# Patient Record
Sex: Female | Born: 1970 | Race: White | Hispanic: No | Marital: Married | Smoking: Never smoker
Health system: Southern US, Community
[De-identification: ages and names within clinical notes are randomized; demographics above are authoritative.]

## PROBLEM LIST (undated history)

## (undated) DIAGNOSIS — D649 Anemia, unspecified: Secondary | ICD-10-CM

## (undated) DIAGNOSIS — K219 Gastro-esophageal reflux disease without esophagitis: Secondary | ICD-10-CM

## (undated) DIAGNOSIS — Z800 Family history of malignant neoplasm of digestive organs: Secondary | ICD-10-CM

## (undated) DIAGNOSIS — Z8051 Family history of malignant neoplasm of kidney: Secondary | ICD-10-CM

## (undated) DIAGNOSIS — M549 Dorsalgia, unspecified: Secondary | ICD-10-CM

## (undated) DIAGNOSIS — G4730 Sleep apnea, unspecified: Secondary | ICD-10-CM

## (undated) DIAGNOSIS — J45909 Unspecified asthma, uncomplicated: Secondary | ICD-10-CM

## (undated) DIAGNOSIS — Z8049 Family history of malignant neoplasm of other genital organs: Secondary | ICD-10-CM

## (undated) DIAGNOSIS — E538 Deficiency of other specified B group vitamins: Secondary | ICD-10-CM

## (undated) DIAGNOSIS — M1990 Unspecified osteoarthritis, unspecified site: Secondary | ICD-10-CM

## (undated) DIAGNOSIS — R609 Edema, unspecified: Secondary | ICD-10-CM

## (undated) DIAGNOSIS — T7840XA Allergy, unspecified, initial encounter: Secondary | ICD-10-CM

## (undated) DIAGNOSIS — Z96653 Presence of artificial knee joint, bilateral: Secondary | ICD-10-CM

## (undated) DIAGNOSIS — M2550 Pain in unspecified joint: Secondary | ICD-10-CM

## (undated) DIAGNOSIS — R0600 Dyspnea, unspecified: Secondary | ICD-10-CM

## (undated) HISTORY — DX: Anemia, unspecified: D64.9

## (undated) HISTORY — DX: Allergy, unspecified, initial encounter: T78.40XA

## (undated) HISTORY — DX: Sleep apnea, unspecified: G47.30

## (undated) HISTORY — DX: Dorsalgia, unspecified: M54.9

## (undated) HISTORY — PX: JOINT REPLACEMENT: SHX530

## (undated) HISTORY — DX: Unspecified asthma, uncomplicated: J45.909

## (undated) HISTORY — DX: Gastro-esophageal reflux disease without esophagitis: K21.9

## (undated) HISTORY — DX: Pain in unspecified joint: M25.50

## (undated) HISTORY — DX: Family history of malignant neoplasm of digestive organs: Z80.0

## (undated) HISTORY — DX: Dyspnea, unspecified: R06.00

## (undated) HISTORY — DX: Family history of malignant neoplasm of other genital organs: Z80.49

## (undated) HISTORY — DX: Family history of malignant neoplasm of kidney: Z80.51

## (undated) HISTORY — DX: Presence of artificial knee joint, bilateral: Z96.653

## (undated) HISTORY — DX: Unspecified osteoarthritis, unspecified site: M19.90

## (undated) HISTORY — DX: Deficiency of other specified B group vitamins: E53.8

## (undated) HISTORY — DX: Edema, unspecified: R60.9

---

## 1996-09-15 HISTORY — PX: CERVIX LESION DESTRUCTION: SHX591

## 1997-09-15 HISTORY — PX: REDUCTION MAMMAPLASTY: SUR839

## 1997-09-15 HISTORY — PX: BREAST REDUCTION SURGERY: SHX8

## 2013-04-29 ENCOUNTER — Ambulatory Visit: Payer: Self-pay | Admitting: Family Medicine

## 2015-01-26 ENCOUNTER — Other Ambulatory Visit: Payer: Self-pay | Admitting: Unknown Physician Specialty

## 2015-01-26 DIAGNOSIS — M170 Bilateral primary osteoarthritis of knee: Secondary | ICD-10-CM

## 2015-02-02 ENCOUNTER — Ambulatory Visit
Admission: RE | Admit: 2015-02-02 | Discharge: 2015-02-02 | Disposition: A | Discharge status: Home / Self Care | Payer: 59 | Source: Ambulatory Visit | Attending: Unknown Physician Specialty | Admitting: Unknown Physician Specialty

## 2015-02-02 DIAGNOSIS — M170 Bilateral primary osteoarthritis of knee: Secondary | ICD-10-CM

## 2015-02-02 DIAGNOSIS — M25461 Effusion, right knee: Secondary | ICD-10-CM | POA: Diagnosis not present

## 2015-02-02 DIAGNOSIS — M25462 Effusion, left knee: Secondary | ICD-10-CM | POA: Insufficient documentation

## 2015-03-08 DIAGNOSIS — M170 Bilateral primary osteoarthritis of knee: Secondary | ICD-10-CM | POA: Insufficient documentation

## 2015-04-29 ENCOUNTER — Encounter: Payer: Self-pay | Admitting: Internal Medicine

## 2015-04-29 ENCOUNTER — Encounter: Payer: Self-pay | Admitting: Emergency Medicine

## 2015-04-29 ENCOUNTER — Ambulatory Visit
Admission: EM | Admit: 2015-04-29 | Discharge: 2015-04-29 | Disposition: A | Discharge status: Home / Self Care | Payer: 59 | Attending: Emergency Medicine | Admitting: Emergency Medicine

## 2015-04-29 DIAGNOSIS — J029 Acute pharyngitis, unspecified: Secondary | ICD-10-CM | POA: Insufficient documentation

## 2015-04-29 DIAGNOSIS — H6593 Unspecified nonsuppurative otitis media, bilateral: Secondary | ICD-10-CM | POA: Insufficient documentation

## 2015-04-29 DIAGNOSIS — K219 Gastro-esophageal reflux disease without esophagitis: Secondary | ICD-10-CM | POA: Insufficient documentation

## 2015-04-29 DIAGNOSIS — J45990 Exercise induced bronchospasm: Secondary | ICD-10-CM | POA: Insufficient documentation

## 2015-04-29 DIAGNOSIS — Z8742 Personal history of other diseases of the female genital tract: Secondary | ICD-10-CM | POA: Insufficient documentation

## 2015-04-29 LAB — RAPID STREP SCREEN (MED CTR MEBANE ONLY): Streptococcus, Group A Screen (Direct): NEGATIVE

## 2015-04-29 MED ORDER — PREDNISONE 20 MG PO TABS: 40.0000 mg | ORAL_TABLET | Freq: Every day | ORAL | Status: DC

## 2015-04-29 MED ORDER — PREDNISONE 20 MG PO TABS
40.0000 mg | ORAL_TABLET | Freq: Every day | ORAL | Status: AC
  Administered 2015-04-29: 40 mg via ORAL

## 2015-04-29 MED ORDER — IBUPROFEN 800 MG PO TABS
800.0000 mg | ORAL_TABLET | Freq: Three times a day (TID) | ORAL | Status: DC
Start: 2015-04-29 — End: 2018-02-24

## 2015-04-29 MED ORDER — PENICILLIN V POTASSIUM 500 MG PO TABS: 500.0000 mg | ORAL_TABLET | Freq: Two times a day (BID) | ORAL | Status: DC

## 2015-04-29 NOTE — Discharge Instructions (Signed)
Otitis Media With Effusion Otitis media with effusion is the presence of fluid in the middle ear. This is a common problem in children, which often follows ear infections. It may be present for weeks or longer after the infection. Unlike an acute ear infection, otitis media with effusion refers only to fluid behind the ear drum and not infection. Children with repeated ear and sinus infections and allergy problems are the most likely to get otitis media with effusion. CAUSES  The most frequent cause of the fluid buildup is dysfunction of the eustachian tubes. These are the tubes that drain fluid in the ears to the back of the nose (nasopharynx). SYMPTOMS   The main symptom of this condition is hearing loss. As a result, you or your child may:  Listen to the TV at a loud volume.  Not respond to questions.  Ask "what" often when spoken to.  Mistake or confuse one sound or word for another.  There may be a sensation of fullness or pressure but usually not pain. DIAGNOSIS   Your health care provider will diagnose this condition by examining you or your child's ears.  Your health care provider may test the pressure in you or your child's ear with a tympanometer.  A hearing test may be conducted if the problem persists. TREATMENT   Treatment depends on the duration and the effects of the effusion.  Antibiotics, decongestants, nose drops, and cortisone-type drugs (tablets or nasal spray) may not be helpful.  Children with persistent ear effusions may have delayed language or behavioral problems. Children at risk for developmental delays in hearing, learning, and speech may require referral to a specialist earlier than children not at risk.  You or your child's health care provider may suggest a referral to an ear, nose, and throat surgeon for treatment. The following may help restore normal hearing:  Drainage of fluid.  Placement of ear tubes (tympanostomy tubes).  Removal of adenoids  (adenoidectomy). HOME CARE INSTRUCTIONS   Avoid secondhand smoke.  Infants who are breastfed are less likely to have this condition.  Avoid feeding infants while they are lying flat.  Avoid known environmental allergens.  Avoid people who are sick. SEEK MEDICAL CARE IF:   Hearing is not better in 3 months.  Hearing is worse.  Ear pain.  Drainage from the ear.  Dizziness. MAKE SURE YOU:   Understand these instructions.  Will watch your condition.  Will get help right away if you are not doing well or get worse. Document Released: 10/09/2004 Document Revised: 01/16/2014 Document Reviewed: 03/29/2013 [facility] Patient Information 2015 [place], [place]. This information is not intended to replace advice given to you by your health care provider. Make sure you discuss any questions you have with your health care provider. Tonsillitis Tonsillitis is an infection of the throat that causes the tonsils to become red, tender, and swollen. Tonsils are collections of lymphoid tissue at the back of the throat. Each tonsil has crevices (crypts). Tonsils help fight nose and throat infections and keep infection from spreading to other parts of the body for the first 18 months of life.  CAUSES Sudden (acute) tonsillitis is usually caused by infection with streptococcal bacteria. Long-lasting (chronic) tonsillitis occurs when the crypts of the tonsils become filled with pieces of food and bacteria, which makes it easy for the tonsils to become repeatedly infected. SYMPTOMS  Symptoms of tonsillitis include:  A sore throat, with possible difficulty swallowing.  White patches on the tonsils.  Fever.  Tiredness.  New episodes of snoring during sleep, when you did not snore before.  Small, foul-smelling, yellowish-white pieces of material (tonsilloliths) that you occasionally cough up or spit out. The tonsilloliths can also cause you to have bad breath. DIAGNOSIS Tonsillitis can be  diagnosed through a physical exam. Diagnosis can be confirmed with the results of lab tests, including a throat culture. TREATMENT  The goals of tonsillitis treatment include the reduction of the severity and duration of symptoms and prevention of associated conditions. Symptoms of tonsillitis can be improved with the use of steroids to reduce the swelling. Tonsillitis caused by bacteria can be treated with antibiotic medicines. Usually, treatment with antibiotic medicines is started before the cause of the tonsillitis is known. However, if it is determined that the cause is not bacterial, antibiotic medicines will not treat the tonsillitis. If attacks of tonsillitis are severe and frequent, your health care provider may recommend surgery to remove the tonsils (tonsillectomy). HOME CARE INSTRUCTIONS   Rest as much as possible and get plenty of sleep.  Drink plenty of fluids. While the throat is very sore, eat soft foods or liquids, such as sherbet, soups, or instant breakfast drinks.  Eat frozen ice pops.  Gargle with a warm or cold liquid to help soothe the throat. Mix 1/4 teaspoon of salt and 1/4 teaspoon of baking soda in 8 oz of water. SEEK MEDICAL CARE IF:   Large, tender lumps develop in your neck.  A rash develops.  A green, yellow-brown, or bloody substance is coughed up.  You are unable to swallow liquids or food for 24 hours.  You notice that only one of the tonsils is swollen. SEEK IMMEDIATE MEDICAL CARE IF:   You develop any new symptoms such as vomiting, severe headache, stiff neck, chest pain, or trouble breathing or swallowing.  You have severe throat pain along with drooling or voice changes.  You have severe pain, unrelieved with recommended medications.  You are unable to fully open the mouth.  You develop redness, swelling, or severe pain anywhere in the neck.  You have a fever. MAKE SURE YOU:   Understand these instructions.  Will watch your  condition.  Will get help right away if you are not doing well or get worse. Document Released: 06/11/2005 Document Revised: 01/16/2014 Document Reviewed: 02/18/2013 [facility] Patient Information 2015 [place], [place]. This information is not intended to replace advice given to you by your health care provider. Make sure you discuss any questions you have with your health care provider.

## 2015-04-29 NOTE — ED Provider Notes (Signed)
CSN: [id number]     Arrival date & time 04/29/15  1408 History   First MD Initiated Contact with Patient 04/29/15 1630     Chief Complaint  Patient presents with  . 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 Today feels like solid food catching in her throat.  Fluids ok.   Seasonal allergic rhinitis typically spring.  No known sick contacts.  Takes motrin po prn at home without side effects if needed.  PShx:  Breast reduction 1999  PMHX GERD on prilosec  Father cancer M- parkinsons, HTN  PCM L Burglund  Patient is a 44 y.o. female presenting with pharyngitis. The history is provided by the patient.  Sore Throat This is a new problem. The current episode started more than 2 days ago. The problem occurs constantly. The problem has been gradually worsening. Pertinent negatives include no chest pain, no abdominal pain, no headaches and no shortness of breath. The symptoms are aggravated by eating, swallowing and drinking. Nothing relieves the symptoms. She has tried food, water and rest for the symptoms. The treatment provided no relief.    No past medical history on file. Past Surgical History  Procedure Laterality Date  . Breast reduction surgery  1999  . Cervix lesion destruction  1998    abnormal PAP   Family History  Problem Relation Age of Onset  . Hypertension Mother   . Hypertension Father   . Renal cancer Father    Social History  Substance Use Topics  . Smoking status: Never Smoker   . Smokeless tobacco: None  . Alcohol Use: 0.6 oz/week    1 Glasses of wine per week   OB History    No data available     Review of Systems  Constitutional: Negative for fever, chills, diaphoresis, activity change, appetite change and fatigue.   HENT: Positive for sore throat and trouble swallowing. Negative for congestion, dental problem, drooling, ear discharge, ear pain, facial swelling, hearing loss, mouth sores, nosebleeds, rhinorrhea, sneezing, tinnitus and voice change.   Eyes: Negative for photophobia, pain, discharge, redness, itching and visual disturbance.  Respiratory: Positive for cough. Negative for choking, chest tightness, shortness of breath, wheezing and stridor.   Cardiovascular: Negative for chest pain, palpitations and leg swelling.  Gastrointestinal: Positive for nausea. Negative for vomiting, abdominal pain, diarrhea, constipation, blood in stool and abdominal distention.  Endocrine: Negative for cold intolerance and heat intolerance.  Genitourinary: Negative for dysuria and hematuria.  Musculoskeletal: Negative for myalgias, back pain, joint swelling, arthralgias, gait problem, neck pain and neck stiffness.  Skin: Negative for color change, pallor, rash and wound.  Allergic/Immunologic: Positive for environmental allergies. Negative for food allergies.  Neurological: Negative for dizziness, tremors, seizures, syncope, facial asymmetry, speech difficulty, weakness, light-headedness, numbness and headaches.  Hematological: Negative for adenopathy. Does not bruise/bleed easily.  Psychiatric/Behavioral: Negative for behavioral problems, confusion, sleep disturbance and agitation.    Allergies  Review of patient's allergies indicates no known allergies.  Home Medications   Prior to Admission medications   Medication Sig Start Date End Date Taking? Authorizing Provider  omeprazole (PRILOSEC) 20 MG capsule Take 20 mg by mouth daily.   Yes Historical Provider, MD  ibuprofen (ADVIL,MOTRIN) 800 MG tablet Take 1 tablet (800 mg total) by mouth 3 (three) times daily. 04/29/15   Olen Cordial, NP  penicillin v potassium (VEETID) 500 MG tablet Take 1 tablet (500 mg total) by mouth 2 (two) times daily. 04/29/15   Olen Cordial, NP  predniSONE (DELTASONE) 20 MG tablet Take 2 tablets (40 mg total) by mouth daily with breakfast. 04/29/15   Aura Fey Betancourt, NP   BP 131/65 mmHg  Pulse 69  Temp(Src) 98.4 F (36.9 C) (Oral)  Resp 18  Ht 5\' 3"  (1.6 m)  Wt 270 lb (122.471 kg)  BMI 47.84 kg/m2  SpO2 99%  LMP 04/09/2015 Physical Exam  Constitutional: She is oriented to person, place, and time. Vital signs are normal. She appears well-developed and well-nourished. No distress.  HENT:  Head: Normocephalic and atraumatic.  Right Ear: Hearing, external ear and ear canal normal. A middle ear effusion is present.  Left Ear: Hearing, external ear and ear canal normal. A middle ear effusion is present.  Nose: Nose normal. No mucosal edema or rhinorrhea. Right sinus exhibits no maxillary sinus tenderness and no frontal sinus tenderness. Left sinus exhibits no maxillary sinus tenderness and no frontal sinus tenderness.  Mouth/Throat: Uvula is midline and mucous membranes are normal. Mucous membranes are not pale, not dry and not cyanotic. She does not have dentures. No oral lesions. No trismus in the jaw. Normal dentition. No dental abscesses, uvula swelling, lacerations or dental caries. Posterior oropharyngeal edema and posterior oropharyngeal erythema present. No oropharyngeal exudate or tonsillar abscesses.  Cobblestoning posterior pharynx; tonsils erythema edema 0-1+/4; bilateral TMs with air fluid level clear  Eyes: Conjunctivae, EOM and lids are normal. Pupils are equal, round, and reactive to light. Right eye exhibits no discharge. Left eye exhibits no discharge. No scleral icterus.  Neck: Trachea normal and normal range of motion. Neck supple. No tracheal deviation present.  Cardiovascular: Normal rate, regular rhythm, normal heart sounds and intact distal pulses.  Exam reveals no gallop and no friction rub.   No murmur heard. Pulmonary/Chest: Breath sounds normal. No stridor. No respiratory distress. She has no  wheezes. She has no rales. She exhibits no tenderness.  Abdominal: Soft. Bowel sounds are normal. She exhibits no shifting dullness, no distension, no pulsatile liver, no fluid wave, no abdominal bruit, no ascites, no pulsatile midline mass and no mass. There is no tenderness. There is no rigidity, no rebound and no guarding. Hernia confirmed negative in the ventral area.  Dull to percussion x 4quads  Musculoskeletal: Normal range of motion. She exhibits no edema or tenderness.  Lymphadenopathy:    She has no cervical adenopathy.  Neurological: She is alert and oriented to person, place, and time. She exhibits normal muscle tone. Coordination normal.  Skin: Skin is warm, dry and intact. No rash noted. She is not diaphoretic. No erythema. No pallor.  Psychiatric: She has a normal mood and affect. Her speech is normal and behavior is normal. Judgment and thought content normal. Cognition and memory are normal.  Nursing note and vitals reviewed.   ED Course  Procedures (including critical care time) Labs Review Labs Reviewed  RAPID STREP SCREEN (NOT AT [facility])  CULTURE, GROUP A STREP ([facility] ONLY)    Imaging Review No results found.  Medications  predniSONE (DELTASONE) tablet 40 mg (40 mg Oral Given 04/29/15 1659)  administered by CMA Josie Saunders MDM   1. Acute pharyngitis, unspecified pharyngitis type   2. Otitis media with effusion, bilateral    Patient notified rapid strep negative.  Will call with throat culture results once available typically 48 hours.  Currently in community  atypical strep common.  Patient given Rx for penicillin V 500mg  po BID to start if exudate noted on tonsils and/or if throat culture positive.  Start prednisone 40mg  po daily Rx tomorrow pharmacy currently closed patient given first dose in clinic.  Continue motrin 800mg  po TID prn pain.  Offered Dukes' Mouthwash patient refused.  Usually no specific medical treatment is needed if a virus is causing the sore throat.   The throat most often gets better on its own within 5 to 7 days.  Antibiotic medicine does not cure viral pharyngitis.   For acute pharyngitis caused by bacteria, your healthcare provider will prescribe an antibiotic.  Marland Kitchen Do not smoke.  Marland Kitchen Avoid secondhand smoke and other air pollutants.  . Use a cool mist humidifier to add moisture to the air.  . Get plenty of rest.  . You may want to rest your throat by talking less and eating a diet that is mostly liquid or soft for a day or two.   Marland Kitchen Nonprescription throat lozenges and mouthwashes should help relieve the soreness.   . Gargling with warm saltwater and drinking warm liquids may help.  (You can make a saltwater solution by adding 1/4 teaspoon of salt to 8 ounces, or 240 mL, of warm water.)  . A nonprescription pain reliever such as aspirin, acetaminophen, or ibuprofen may ease general aches and pains.   FOLLOW UP with clinic provider if no improvements in the next 7-10 days.  exitcare handout on pharyngitis given to patient.  Patient verbalized understanding of instructions and agreed with plan of care no further questions at this time. P2:  Hand washing and diet.  Supportive treatment.   No evidence of invasive bacterial infection, non toxic and well hydrated.  This is most likely self limiting viral infection.  I do not see where any further testing or imaging is necessary at this time.   I will suggest supportive care, rest, good hygiene and encourage the patient to take adequate fluids.  The patient is to return to clinic or EMERGENCY ROOM if symptoms worsen or change significantly e.g. ear pain, fever, purulent discharge from ears or bleeding.  Exitcare handout on otitis media with effusion given to patient.  Patient verbalized agreement and understanding of treatment plan.     Olen Cordial, NP 04/29/15 1909  Telephone message left for patient throat culture positive strep alginate and to start penicillin V 500mg  po BID Rx that I gave her  at appt if she has not already.  Patient to contact clinic if further questions or concerns.  Olen Cordial, NP 05/02/15 1021  04 May 2015 at 1506 patient contacted via telephone and she verbalized she received my message from 17 Aug started penicillin and symptoms have all resolved.  Patient notified throat culture positive for strep.  Patient to finish all medication.  Patient verbalized understanding of information/instructions, agreed with plan of care and had no further questions at this time.  Olen Cordial, NP 05/04/15 1507

## 2015-04-29 NOTE — ED Notes (Signed)
Pt feels like a knot in right side of throat x x3 days

## 2015-05-02 LAB — CULTURE, GROUP A STREP (THRC)

## 2015-06-01 ENCOUNTER — Ambulatory Visit (INDEPENDENT_AMBULATORY_CARE_PROVIDER_SITE_OTHER): Payer: 59 | Admitting: Internal Medicine

## 2015-06-01 ENCOUNTER — Encounter: Payer: Self-pay | Admitting: Internal Medicine

## 2015-06-01 VITALS — BP 132/76 | HR 72 | Ht 63.0 in | Wt 277.6 lb

## 2015-06-01 DIAGNOSIS — H6500 Acute serous otitis media, unspecified ear: Secondary | ICD-10-CM | POA: Diagnosis not present

## 2015-06-01 MED ORDER — AMOXICILLIN-POT CLAVULANATE 875-125 MG PO TABS: 1.0000 | ORAL_TABLET | Freq: Two times a day (BID) | ORAL | Status: DC

## 2015-06-01 MED ORDER — FLUTICASONE PROPIONATE 50 MCG/ACT NA SUSP: 2.0000 | Freq: Every day | NASAL | Status: DC

## 2015-06-01 NOTE — Progress Notes (Signed)
Date:  06/01/2015   Name:  Kaitlyn Mcdaniel   DOB:  10/18/1970   MRN:  [id number]   Chief Complaint: Dizziness Dizziness Associated symptoms include coughing and fatigue. Pertinent negatives include no chest pain, chills, diaphoresis, fever, headaches or sore throat.   Patient was seen in urgent care a month ago with strep. Strep screen was negative for culture was positive for beta strep. Monospot was not done she received 10 days of amoxicillin and felt that she had recovered. However week ago she began to feel dizzy with fatigue and slight cough and excessive sleepiness. She was slept extra hours per night but still feels no better. She denies ringing in the ears ear drainage recurrent sore throat and fever  Review of Systems:  Review of Systems  Constitutional: Positive for fatigue. Negative for fever, chills and diaphoresis.  HENT: Negative for sore throat, tinnitus and trouble swallowing.   Respiratory: Positive for cough. Negative for shortness of breath.   Cardiovascular: Negative for chest pain.  Neurological: Positive for dizziness and light-headedness. Negative for headaches.    Patient Active Problem List   Diagnosis Date Noted  . Exercise-induced asthma 04/29/2015  . GERD without esophagitis 04/29/2015  . Hx of abnormal cervical Papanicolaou smear 04/29/2015  . Degenerative arthritis of knee, bilateral 03/08/2015    Prior to Admission medications   Medication Sig Start Date End Date Taking? Authorizing Provider  albuterol (PROVENTIL HFA;VENTOLIN HFA) 108 (90 BASE) MCG/ACT inhaler Inhale into the lungs every 6 (six) hours as needed for wheezing or shortness of breath.   Yes Historical Provider, MD  celecoxib (CELEBREX) 200 MG capsule Take 200 mg by mouth 2 (two) times daily.   Yes Historical Provider, MD  ibuprofen (ADVIL,MOTRIN) 800 MG tablet Take 1 tablet (800 mg total) by mouth 3 (three) times daily. 04/29/15  Yes Olen Cordial, NP  omeprazole (PRILOSEC) 20 MG  capsule Take 20 mg by mouth daily.   Yes Historical Provider, MD    No Known Allergies  Past Surgical History  Procedure Laterality Date  . Breast reduction surgery  1999  . Cervix lesion destruction  1998    abnormal PAP    Social History  Substance Use Topics  . Smoking status: Never Smoker   . Smokeless tobacco: None  . Alcohol Use: 0.6 oz/week    1 Glasses of wine per week     Medication list has been reviewed and updated.  Physical Examination:  Physical Exam  Constitutional: She appears well-developed and well-nourished.  HENT:  Right Ear: Ear canal normal. A middle ear effusion is present.  Left Ear: Ear canal normal. A middle ear effusion is present.  Nose: Nose normal.  Mouth/Throat: Uvula is midline and oropharynx is clear and moist.  Eyes: Conjunctivae are normal. Pupils are equal, round, and reactive to light. Right eye exhibits nystagmus. Left eye exhibits nystagmus.  Cardiovascular: Normal rate, regular rhythm and normal heart sounds.   Pulmonary/Chest: Breath sounds normal. She has no wheezes.  Psychiatric: She has a normal mood and affect.    BP 132/76 mmHg  Pulse 72  Ht 5\' 3"  (1.6 m)  Wt 277 lb 9.6 oz (125.919 kg)  BMI 49.19 kg/m2  Assessment and Plan: 1. Acute serous otitis media, recurrence not specified, unspecified laterality - fluticasone (FLONASE) 50 MCG/ACT nasal spray; Place 2 sprays into both nostrils daily.  Dispense: 16 g; Refill: 6 - amoxicillin-clavulanate (AUGMENTIN) 875-125 MG per tablet; Take 1 tablet by mouth 2 (two) times daily.  Dispense: 20 tablet; Refill: 0   Halina Maidens, MD [facility]  06/01/2015

## 2015-06-08 ENCOUNTER — Encounter: Payer: Self-pay | Admitting: Internal Medicine

## 2015-06-08 ENCOUNTER — Ambulatory Visit (INDEPENDENT_AMBULATORY_CARE_PROVIDER_SITE_OTHER): Payer: 59 | Admitting: Internal Medicine

## 2015-06-08 VITALS — BP 140/84 | HR 76 | Ht 63.0 in | Wt 275.0 lb

## 2015-06-08 DIAGNOSIS — J45990 Exercise induced bronchospasm: Secondary | ICD-10-CM | POA: Diagnosis not present

## 2015-06-08 DIAGNOSIS — K219 Gastro-esophageal reflux disease without esophagitis: Secondary | ICD-10-CM

## 2015-06-08 DIAGNOSIS — R5383 Other fatigue: Secondary | ICD-10-CM | POA: Diagnosis not present

## 2015-06-08 MED ORDER — ALBUTEROL SULFATE HFA 108 (90 BASE) MCG/ACT IN AERS
2.0000 | INHALATION_SPRAY | Freq: Four times a day (QID) | RESPIRATORY_TRACT | Status: DC | PRN
Start: 2015-06-08 — End: 2016-10-21

## 2015-06-08 NOTE — Progress Notes (Signed)
Date:  06/08/2015   Name:  Kaitlyn Mcdaniel   DOB:  August 15, 1971   MRN:  [id number]   Chief Complaint: Dizziness and Fatigue  Kaitlyn Mcdaniel was seen last week with serous otitis media. She was given another round of antibiotics and placed on Flonase. She says the ears feel better but she is still dizzy at times. And now she seems overwhelmed by fatigue. On questioning,  the fatigue was present before the infection but now seems worse. She sleeps 10 or 11 hours and doesn't awake feeling rested. She admits to snoring but is not aware of any apnea.  Review of Systems:  Review of Systems  Constitutional: Positive for fatigue. Negative for fever and chills.  HENT: Negative for hearing loss.   Respiratory: Negative for cough, chest tightness and shortness of breath.   Cardiovascular: Negative for chest pain and palpitations.  Musculoskeletal: Negative for myalgias and joint swelling.  Neurological: Positive for dizziness. Negative for syncope and numbness.  Hematological: Negative for adenopathy.    Patient Active Problem List   Diagnosis Date Noted  . Exercise-induced asthma 04/29/2015  . GERD without esophagitis 04/29/2015  . Hx of abnormal cervical Papanicolaou smear 04/29/2015  . Degenerative arthritis of knee, bilateral 03/08/2015    Prior to Admission medications   Medication Sig Start Date End Date Taking? Authorizing Provider  albuterol (PROVENTIL HFA;VENTOLIN HFA) 108 (90 BASE) MCG/ACT inhaler Inhale into the lungs every 6 (six) hours as needed for wheezing or shortness of breath.   Yes Historical Provider, MD  amoxicillin-clavulanate (AUGMENTIN) 875-125 MG per tablet Take 1 tablet by mouth 2 (two) times daily. 06/01/15  Yes Glean Hess, MD  celecoxib (CELEBREX) 200 MG capsule Take 200 mg by mouth 2 (two) times daily.   Yes Historical Provider, MD  fluticasone (FLONASE) 50 MCG/ACT nasal spray Place 2 sprays into both nostrils daily. 06/01/15  Yes Glean Hess, MD  ibuprofen  (ADVIL,MOTRIN) 800 MG tablet Take 1 tablet (800 mg total) by mouth 3 (three) times daily. 04/29/15  Yes Olen Cordial, NP  omeprazole (PRILOSEC) 20 MG capsule Take 20 mg by mouth daily.   Yes Historical Provider, MD    No Known Allergies  Past Surgical History  Procedure Laterality Date  . Breast reduction surgery  1999  . Cervix lesion destruction  1998    abnormal PAP    Social History  Substance Use Topics  . Smoking status: Never Smoker   . Smokeless tobacco: None  . Alcohol Use: 0.6 oz/week    1 Glasses of wine per week     Medication list has been reviewed and updated.  Physical Examination:  Physical Exam  Constitutional: She appears well-developed and well-nourished.  HENT:  Right Ear: Ear canal normal. A middle ear effusion is present.  Left Ear: Ear canal normal. A middle ear effusion is present.  Eyes: Pupils are equal, round, and reactive to light. Right eye exhibits nystagmus. Left eye exhibits nystagmus.  Neck: Normal range of motion. Neck supple.  Cardiovascular: Normal rate and regular rhythm.   Pulmonary/Chest: Effort normal and breath sounds normal.  Lymphadenopathy:    She has no cervical adenopathy.  Psychiatric: She has a normal mood and affect.    BP 140/84 mmHg  Pulse 76  Ht 5\' 3"  (1.6 m)  Wt 275 lb (124.739 kg)  BMI 48.73 kg/m2  Assessment and Plan: 1. Exercise-induced asthma - albuterol (PROVENTIL HFA;VENTOLIN HFA) 108 (90 BASE) MCG/ACT inhaler; Inhale 2 puffs into the lungs every  6 (six) hours as needed for wheezing or shortness of breath.  Dispense: 18 g; Refill: 3  2. Other fatigue We'll rule out metabolic cause; Consider sleep study Recommend that she see ENT for persistent middle ear ear effusion and dizziness - Comprehensive metabolic panel - TSH - CBC with Differential/Platelet  3. GERD without esophagitis - CBC with Differential/Platelet   Halina Maidens, MD [facility]  06/08/2015

## 2015-06-09 ENCOUNTER — Other Ambulatory Visit: Payer: Self-pay | Admitting: Internal Medicine

## 2015-06-09 DIAGNOSIS — R0683 Snoring: Secondary | ICD-10-CM

## 2015-06-09 DIAGNOSIS — R5382 Chronic fatigue, unspecified: Secondary | ICD-10-CM

## 2015-06-09 LAB — CBC WITH DIFFERENTIAL/PLATELET
BASOS: 0 %
Basophils Absolute: 0 10*3/uL (ref 0.0–0.2)
EOS (ABSOLUTE): 0.3 10*3/uL (ref 0.0–0.4)
EOS: 4 %
HEMATOCRIT: 37.2 % (ref 34.0–46.6)
HEMOGLOBIN: 11.8 g/dL (ref 11.1–15.9)
IMMATURE GRANULOCYTES: 0 %
Immature Grans (Abs): 0 10*3/uL (ref 0.0–0.1)
Lymphocytes Absolute: 1.9 10*3/uL (ref 0.7–3.1)
Lymphs: 24 %
MCH: 24.7 pg — ABNORMAL LOW (ref 26.6–33.0)
MCHC: 31.7 g/dL (ref 31.5–35.7)
MCV: 78 fL — AB (ref 79–97)
MONOCYTES: 7 %
MONOS ABS: 0.5 10*3/uL (ref 0.1–0.9)
NEUTROS PCT: 65 %
Neutrophils Absolute: 5.3 10*3/uL (ref 1.4–7.0)
Platelets: 267 10*3/uL (ref 150–379)
RBC: 4.77 x10E6/uL (ref 3.77–5.28)
RDW: 15.5 % — AB (ref 12.3–15.4)
WBC: 8 10*3/uL (ref 3.4–10.8)

## 2015-06-09 LAB — COMPREHENSIVE METABOLIC PANEL
A/G RATIO: 1.4 (ref 1.1–2.5)
ALT: 14 IU/L (ref 0–32)
AST: 12 IU/L (ref 0–40)
Albumin: 3.8 g/dL (ref 3.5–5.5)
Alkaline Phosphatase: 100 IU/L (ref 39–117)
BUN/Creatinine Ratio: 18 (ref 9–23)
BUN: 14 mg/dL (ref 6–24)
Bilirubin Total: 0.3 mg/dL (ref 0.0–1.2)
CALCIUM: 9 mg/dL (ref 8.7–10.2)
CO2: 25 mmol/L (ref 18–29)
CREATININE: 0.76 mg/dL (ref 0.57–1.00)
Chloride: 102 mmol/L (ref 97–108)
GFR, EST AFRICAN AMERICAN: 110 mL/min/{1.73_m2} (ref >59)
GFR, EST NON AFRICAN AMERICAN: 95 mL/min/{1.73_m2} (ref >59)
GLOBULIN, TOTAL: 2.8 g/dL (ref 1.5–4.5)
Glucose: 107 mg/dL — ABNORMAL HIGH (ref 65–99)
POTASSIUM: 4.5 mmol/L (ref 3.5–5.2)
SODIUM: 141 mmol/L (ref 134–144)
TOTAL PROTEIN: 6.6 g/dL (ref 6.0–8.5)

## 2015-06-09 LAB — TSH: TSH: 1.38 u[IU]/mL (ref 0.450–4.500)

## 2015-07-03 ENCOUNTER — Ambulatory Visit: Payer: 59 | Attending: Internal Medicine

## 2015-07-03 DIAGNOSIS — G4733 Obstructive sleep apnea (adult) (pediatric): Secondary | ICD-10-CM | POA: Insufficient documentation

## 2015-08-02 ENCOUNTER — Ambulatory Visit: Payer: 59 | Attending: Internal Medicine

## 2015-08-02 DIAGNOSIS — G4733 Obstructive sleep apnea (adult) (pediatric): Secondary | ICD-10-CM | POA: Insufficient documentation

## 2015-08-06 ENCOUNTER — Encounter: Payer: Self-pay | Admitting: Internal Medicine

## 2015-08-06 DIAGNOSIS — G4733 Obstructive sleep apnea (adult) (pediatric): Secondary | ICD-10-CM | POA: Insufficient documentation

## 2015-09-05 ENCOUNTER — Encounter: Payer: Self-pay | Admitting: Internal Medicine

## 2015-09-05 ENCOUNTER — Ambulatory Visit (INDEPENDENT_AMBULATORY_CARE_PROVIDER_SITE_OTHER): Payer: 59 | Admitting: Internal Medicine

## 2015-09-05 ENCOUNTER — Other Ambulatory Visit: Payer: Self-pay | Admitting: Internal Medicine

## 2015-09-05 VITALS — BP 130/88 | HR 60 | Ht 63.0 in | Wt 278.4 lb

## 2015-09-05 DIAGNOSIS — J45990 Exercise induced bronchospasm: Secondary | ICD-10-CM | POA: Diagnosis not present

## 2015-09-05 DIAGNOSIS — Z0000 Encounter for general adult medical examination without abnormal findings: Secondary | ICD-10-CM

## 2015-09-05 DIAGNOSIS — R7303 Prediabetes: Secondary | ICD-10-CM | POA: Insufficient documentation

## 2015-09-05 DIAGNOSIS — G4733 Obstructive sleep apnea (adult) (pediatric): Secondary | ICD-10-CM | POA: Diagnosis not present

## 2015-09-05 DIAGNOSIS — K219 Gastro-esophageal reflux disease without esophagitis: Secondary | ICD-10-CM | POA: Diagnosis not present

## 2015-09-05 DIAGNOSIS — R7309 Other abnormal glucose: Secondary | ICD-10-CM

## 2015-09-05 DIAGNOSIS — Z124 Encounter for screening for malignant neoplasm of cervix: Secondary | ICD-10-CM | POA: Diagnosis not present

## 2015-09-05 DIAGNOSIS — E119 Type 2 diabetes mellitus without complications: Secondary | ICD-10-CM | POA: Insufficient documentation

## 2015-09-05 DIAGNOSIS — M170 Bilateral primary osteoarthritis of knee: Secondary | ICD-10-CM | POA: Diagnosis not present

## 2015-09-05 DIAGNOSIS — Z1231 Encounter for screening mammogram for malignant neoplasm of breast: Secondary | ICD-10-CM

## 2015-09-05 DIAGNOSIS — R739 Hyperglycemia, unspecified: Secondary | ICD-10-CM

## 2015-09-05 DIAGNOSIS — Z79899 Other long term (current) drug therapy: Secondary | ICD-10-CM | POA: Insufficient documentation

## 2015-09-05 DIAGNOSIS — E118 Type 2 diabetes mellitus with unspecified complications: Secondary | ICD-10-CM | POA: Insufficient documentation

## 2015-09-05 LAB — POCT URINALYSIS DIPSTICK
BILIRUBIN UA: NEGATIVE
GLUCOSE UA: NEGATIVE
Ketones, UA: NEGATIVE
LEUKOCYTES UA: NEGATIVE
Nitrite, UA: NEGATIVE
PH UA: 5
Protein, UA: NEGATIVE
RBC UA: NEGATIVE
Spec Grav, UA: 1.02
Urobilinogen, UA: 0.2

## 2015-09-05 MED ORDER — OMEPRAZOLE 20 MG PO CPDR: 20.0000 mg | DELAYED_RELEASE_CAPSULE | Freq: Every day | ORAL | Status: DC

## 2015-09-05 MED ORDER — TEMAZEPAM 15 MG PO CAPS: 15.0000 mg | ORAL_CAPSULE | Freq: Every evening | ORAL | Status: DC | PRN

## 2015-09-05 NOTE — Progress Notes (Signed)
Date:  09/05/2015   Name:  Kaitlyn Mcdaniel   DOB:  12-Feb-1971   MRN:  [id number]   Chief Complaint: Annual Exam Kaitlyn Mcdaniel is a 44 y.o. female who presents today for her Complete Annual Exam. She feels fairly well. She reports exercising none. She reports she is sleeping poorly due to new CPAP therapy. She is struggling to get to sleep for several hours.  Once she falls asleep she tolerates it well.  Knee Pain  The pain is present in the left knee and right knee. The quality of the pain is described as aching. The pain is moderate. The pain has been intermittent since onset. Improvement on treatment: need knee replacement.  Gastroesophageal Reflux She complains of belching and heartburn. She reports no abdominal pain, no chest pain, no coughing, no nausea, no sore throat or no wheezing. This is a chronic problem. The problem occurs occasionally. The heartburn does not wake her from sleep. The heartburn does not limit her activity. The heartburn doesn't change with position. Pertinent negatives include no fatigue.    Review of Systems  Constitutional: Negative for chills, diaphoresis, fatigue and unexpected weight change (gradually gained 25 lbs over past year or so).  HENT: Negative for hearing loss, sore throat, tinnitus and trouble swallowing.   Eyes: Negative for visual disturbance.  Respiratory: Positive for apnea (started CPAP 2 weeks ag). Negative for cough, chest tightness, shortness of breath and wheezing.   Cardiovascular: Negative for chest pain, palpitations and leg swelling.  Gastrointestinal: Positive for heartburn. Negative for nausea, abdominal pain, diarrhea and blood in stool.  Genitourinary: Negative for dysuria, urgency, frequency, hematuria, vaginal pain and menstrual problem (slightly irregular).  Musculoskeletal: Positive for arthralgias (bilateral knee OA). Negative for myalgias and joint swelling.  Skin: Negative for rash.  Neurological: Negative for  dizziness, tremors and headaches.  Hematological: Negative for adenopathy. Does not bruise/bleed easily.  Psychiatric/Behavioral: Positive for sleep disturbance. Negative for dysphoric mood. The patient is not nervous/anxious.     Patient Active Problem List   Diagnosis Date Noted  . OSA (obstructive sleep apnea) 08/06/2015  . Exercise-induced asthma 04/29/2015  . GERD without esophagitis 04/29/2015  . Hx of abnormal cervical Papanicolaou smear 04/29/2015  . Degenerative arthritis of knee, bilateral 03/08/2015    Prior to Admission medications   Medication Sig Start Date End Date Taking? Authorizing Provider  albuterol (PROVENTIL HFA;VENTOLIN HFA) 108 (90 BASE) MCG/ACT inhaler Inhale 2 puffs into the lungs every 6 (six) hours as needed for wheezing or shortness of breath. 06/08/15  Yes Glean Hess, MD  celecoxib (CELEBREX) 200 MG capsule Take 200 mg by mouth 2 (two) times daily.   Yes Historical Provider, MD  ibuprofen (ADVIL,MOTRIN) 800 MG tablet Take 1 tablet (800 mg total) by mouth 3 (three) times daily. 04/29/15  Yes Olen Cordial, NP  omeprazole (PRILOSEC) 20 MG capsule Take 20 mg by mouth daily.   Yes Historical Provider, MD    No Known Allergies  Past Surgical History  Procedure Laterality Date  . Breast reduction surgery  1999  . Cervix lesion destruction  1998    abnormal PAP    Social History  Substance Use Topics  . Smoking status: Never Smoker   . Smokeless tobacco: None  . Alcohol Use: 0.6 oz/week    1 Glasses of wine per week    Medication list has been reviewed and updated.   Physical Exam  Constitutional: She is oriented to person, place, and  time. She appears well-developed and well-nourished. No distress.  HENT:  Head: Normocephalic and atraumatic.  Right Ear: Tympanic membrane and ear canal normal.  Left Ear: Tympanic membrane and ear canal normal.  Nose: Right sinus exhibits no maxillary sinus tenderness. Left sinus exhibits no maxillary  sinus tenderness.  Mouth/Throat: Uvula is midline and oropharynx is clear and moist.  Eyes: Conjunctivae and EOM are normal.  Neck: Normal range of motion. Carotid bruit is not present. No erythema present. No thyromegaly present.  Cardiovascular: Normal rate, regular rhythm, normal heart sounds and normal pulses.   Pulmonary/Chest: Effort normal. No respiratory distress. She has no wheezes. Right breast exhibits no mass, no nipple discharge, no skin change and no tenderness. Left breast exhibits no mass, no nipple discharge, no skin change and no tenderness.  S/p bilateral breast reduction surgery  Abdominal: Soft. Bowel sounds are normal. There is no hepatosplenomegaly. There is no tenderness. There is no CVA tenderness.  Genitourinary: Rectum normal, vagina normal and uterus normal. There is no tenderness, lesion or injury on the right labia. There is no tenderness, lesion or injury on the left labia. Cervix exhibits discharge and friability. Cervix exhibits no motion tenderness. Right adnexum displays no mass, no tenderness and no fullness. Left adnexum displays no mass, no tenderness and no fullness. No vaginal discharge found.  Musculoskeletal:       Right knee: She exhibits decreased range of motion and effusion.       Left knee: She exhibits decreased range of motion. She exhibits no effusion.  Lymphadenopathy:    She has no cervical adenopathy.    She has no axillary adenopathy.  Neurological: She is alert and oriented to person, place, and time. She has normal reflexes. No cranial nerve deficit or sensory deficit.  Skin: Skin is warm, dry and intact. No rash noted.  Psychiatric: She has a normal mood and affect. Her speech is normal and behavior is normal. Thought content normal.  Nursing note and vitals reviewed.   BP 130/88 mmHg  Pulse 60  Ht 5\' 3"  (1.6 m)  Wt 278 lb 6.4 oz (126.281 kg)  BMI 49.33 kg/m2  Assessment and Plan: 1. Annual physical exam Discussed exercise and  structured diet for weight loss Recent CBC and TSH were normal Mammogram scheduled for the end of December - POCT urinalysis dipstick - Comprehensive metabolic panel - Lipid panel  2. Exercise-induced asthma Stable - has albuterol MDI to use as needed  3. GERD without esophagitis Controlled on daily PPI Begin Multi-vit with iron daily - omeprazole (PRILOSEC) 20 MG capsule; Take 1 capsule (20 mg total) by mouth daily.  Dispense: 90 capsule; Refill: 3  4. OSA (obstructive sleep apnea) Recently started therapy - will try temazepam to help with adjustment - temazepam (RESTORIL) 15 MG capsule; Take 1 capsule (15 mg total) by mouth at bedtime as needed for sleep.  Dispense: 30 capsule; Refill: 5  5. Encounter for screening for cervical cancer  - Pap IG and HPV (high risk) DNA detection  6. Elevated blood sugar - Hemoglobin A1c  7. Primary osteoarthritis of both knees On Celebrex; needs surgery Followed by Dr. Alphonzo Lemmings, MD [facility]  09/05/2015

## 2015-09-05 NOTE — Patient Instructions (Signed)
Breast Self-Awareness Practicing breast self-awareness may pick up problems early, prevent significant medical complications, and possibly save your life. By practicing breast self-awareness, you can become familiar with how your breasts look and feel and if your breasts are changing. This allows you to notice changes early. It can also offer you some reassurance that your breast health is good. One way to learn what is normal for your breasts and whether your breasts are changing is to do a breast self-exam. If you find a lump or something that was not present in the past, it is best to contact your caregiver right away. Other findings that should be evaluated by your caregiver include nipple discharge, especially if it is bloody; skin changes or reddening; areas where the skin seems to be pulled in (retracted); or new lumps and bumps. Breast pain is seldom associated with cancer (malignancy), but should also be evaluated by a caregiver. HOW TO PERFORM A BREAST SELF-EXAM The best time to examine your breasts is 5-7 days after your menstrual period is over. During menstruation, the breasts are lumpier, and it may be more difficult to pick up changes. If you do not menstruate, have reached menopause, or had your uterus removed (hysterectomy), you should examine your breasts at regular intervals, such as monthly. If you are breastfeeding, examine your breasts after a feeding or after using a breast pump. Breast implants do not decrease the risk for lumps or tumors, so continue to perform breast self-exams as recommended. Talk to your caregiver about how to determine the difference between the implant and breast tissue. Also, talk about the amount of pressure you should use during the exam. Over time, you will become more familiar with the variations of your breasts and more comfortable with the exam. A breast self-exam requires you to remove all your clothes above the waist. 1. Look at your breasts and nipples.  Stand in front of a mirror in a room with good lighting. With your hands on your hips, push your hands firmly downward. Look for a difference in shape, contour, and size from one breast to the other (asymmetry). Asymmetry includes puckers, dips, or bumps. Also, look for skin changes, such as reddened or scaly areas on the breasts. Look for nipple changes, such as discharge, dimpling, repositioning, or redness. 2. Carefully feel your breasts. This is best done either in the shower or tub while using soapy water or when flat on your back. Place the arm (on the side of the breast you are examining) above your head. Use the pads (not the fingertips) of your three middle fingers on your opposite hand to feel your breasts. Start in the underarm area and use  inch (2 cm) overlapping circles to feel your breast. Use 3 different levels of pressure (light, medium, and firm pressure) at each circle before moving to the next circle. The light pressure is needed to feel the tissue closest to the skin. The medium pressure will help to feel breast tissue a little deeper, while the firm pressure is needed to feel the tissue close to the ribs. Continue the overlapping circles, moving downward over the breast until you feel your ribs below your breast. Then, move one finger-width towards the center of the body. Continue to use the  inch (2 cm) overlapping circles to feel your breast as you move slowly up toward the collar bone (clavicle) near the base of the neck. Continue the up and down exam using all 3 pressures until you reach the   middle of the chest. Do this with each breast, carefully feeling for lumps or changes. 3.  Keep a written record with breast changes or normal findings for each breast. By writing this information down, you do not need to depend only on memory for size, tenderness, or location. Write down where you are in your menstrual cycle, if you are still menstruating. Breast tissue can have some lumps or  thick tissue. However, see your caregiver if you find anything that concerns you.  SEEK MEDICAL CARE IF:  You see a change in shape, contour, or size of your breasts or nipples.   You see skin changes, such as reddened or scaly areas on the breasts or nipples.   You have an unusual discharge from your nipples.   You feel a new lump or unusually thick areas.    This information is not intended to replace advice given to you by your health care provider. Make sure you discuss any questions you have with your health care provider.   Document Released: 09/01/2005 Document Revised: 08/18/2012 Document Reviewed: 12/17/2011 Elsevier Interactive Patient Education 2016 [employer]  

## 2015-09-06 LAB — COMPREHENSIVE METABOLIC PANEL
A/G RATIO: 1.4 (ref 1.1–2.5)
ALK PHOS: 97 IU/L (ref 39–117)
ALT: 11 IU/L (ref 0–32)
AST: 12 IU/L (ref 0–40)
Albumin: 3.8 g/dL (ref 3.5–5.5)
BUN/Creatinine Ratio: 18 (ref 9–23)
BUN: 14 mg/dL (ref 6–24)
Bilirubin Total: 0.2 mg/dL (ref 0.0–1.2)
CALCIUM: 9.1 mg/dL (ref 8.7–10.2)
CO2: 24 mmol/L (ref 18–29)
Chloride: 99 mmol/L (ref 96–106)
Creatinine, Ser: 0.8 mg/dL (ref 0.57–1.00)
GFR calc Af Amer: 103 mL/min/{1.73_m2} (ref >59)
GFR calc non Af Amer: 89 mL/min/{1.73_m2} (ref >59)
GLOBULIN, TOTAL: 2.8 g/dL (ref 1.5–4.5)
Glucose: 87 mg/dL (ref 65–99)
POTASSIUM: 4 mmol/L (ref 3.5–5.2)
SODIUM: 140 mmol/L (ref 134–144)
Total Protein: 6.6 g/dL (ref 6.0–8.5)

## 2015-09-06 LAB — LIPID PANEL
CHOLESTEROL TOTAL: 233 mg/dL — AB (ref 100–199)
Chol/HDL Ratio: 4.5 ratio units — ABNORMAL HIGH (ref 0.0–4.4)
HDL: 52 mg/dL (ref >39)
LDL Calculated: 156 mg/dL — ABNORMAL HIGH (ref 0–99)
TRIGLYCERIDES: 127 mg/dL (ref 0–149)
VLDL Cholesterol Cal: 25 mg/dL (ref 5–40)

## 2015-09-06 LAB — HEMOGLOBIN A1C
Est. average glucose Bld gHb Est-mCnc: 128 mg/dL
Hgb A1c MFr Bld: 6.1 % — ABNORMAL HIGH (ref 4.8–5.6)

## 2015-09-13 ENCOUNTER — Ambulatory Visit
Admission: RE | Admit: 2015-09-13 | Discharge: 2015-09-13 | Disposition: A | Discharge status: Home / Self Care | Payer: 59 | Source: Ambulatory Visit | Attending: Internal Medicine | Admitting: Internal Medicine

## 2015-09-13 ENCOUNTER — Other Ambulatory Visit: Payer: Self-pay | Admitting: Internal Medicine

## 2015-09-13 DIAGNOSIS — Z1231 Encounter for screening mammogram for malignant neoplasm of breast: Secondary | ICD-10-CM | POA: Insufficient documentation

## 2015-09-14 LAB — PAP IG AND HPV HIGH-RISK: PAP Smear Comment: 0

## 2015-09-16 DIAGNOSIS — G4733 Obstructive sleep apnea (adult) (pediatric): Secondary | ICD-10-CM | POA: Diagnosis not present

## 2015-09-18 ENCOUNTER — Other Ambulatory Visit: Payer: Self-pay | Admitting: Internal Medicine

## 2015-09-18 DIAGNOSIS — R928 Other abnormal and inconclusive findings on diagnostic imaging of breast: Secondary | ICD-10-CM

## 2015-09-26 ENCOUNTER — Ambulatory Visit
Admission: RE | Admit: 2015-09-26 | Discharge: 2015-09-26 | Disposition: A | Discharge status: Home / Self Care | Payer: 59 | Source: Ambulatory Visit | Attending: Internal Medicine | Admitting: Internal Medicine

## 2015-09-26 DIAGNOSIS — R928 Other abnormal and inconclusive findings on diagnostic imaging of breast: Secondary | ICD-10-CM

## 2015-09-26 DIAGNOSIS — N63 Unspecified lump in breast: Secondary | ICD-10-CM | POA: Insufficient documentation

## 2015-10-11 ENCOUNTER — Encounter: Payer: Self-pay | Admitting: Internal Medicine

## 2015-10-16 ENCOUNTER — Ambulatory Visit (INDEPENDENT_AMBULATORY_CARE_PROVIDER_SITE_OTHER): Payer: 59 | Admitting: Internal Medicine

## 2015-10-16 ENCOUNTER — Encounter: Payer: Self-pay | Admitting: Internal Medicine

## 2015-10-16 VITALS — BP 138/78 | HR 76 | Ht 63.0 in | Wt 279.0 lb

## 2015-10-16 DIAGNOSIS — K219 Gastro-esophageal reflux disease without esophagitis: Secondary | ICD-10-CM | POA: Diagnosis not present

## 2015-10-16 DIAGNOSIS — B354 Tinea corporis: Secondary | ICD-10-CM

## 2015-10-16 MED ORDER — FLUCONAZOLE 100 MG PO TABS: 100.0000 mg | ORAL_TABLET | Freq: Every day | ORAL | Status: DC

## 2015-10-16 MED ORDER — OMEPRAZOLE 20 MG PO CPDR: 20.0000 mg | DELAYED_RELEASE_CAPSULE | Freq: Two times a day (BID) | ORAL | Status: DC

## 2015-10-16 MED ORDER — NYSTATIN 100000 UNIT/GM EX CREA: 1.0000 "application " | TOPICAL_CREAM | Freq: Two times a day (BID) | CUTANEOUS | Status: DC

## 2015-10-16 NOTE — Progress Notes (Signed)
Date:  10/16/2015   Name:  Kaitlyn Mcdaniel   DOB:  [birth year]   MRN:  [id number]   Chief Complaint: Breast Problem Rash This is a new problem. The current episode started 1 to 4 weeks ago. The problem is unchanged. The affected locations include the chest (right nipple). The rash is characterized by itchiness, dryness and scaling. She was exposed to nothing. Pertinent negatives include no cough. Past treatments include nothing. There is no history of asthma or eczema.  Gastroesophageal Reflux She reports no abdominal pain, no belching, no chest pain, no choking or no coughing. There are no known risk factors. She has tried a PPI (twice a day omeprazole needed to control sx) for the symptoms.      Review of Systems  Respiratory: Negative for cough and choking.   Cardiovascular: Negative for chest pain and palpitations.  Gastrointestinal: Negative for abdominal pain and blood in stool.  Skin: Positive for color change and rash.  Hematological: Negative for adenopathy.    Patient Active Problem List   Diagnosis Date Noted  . Elevated blood sugar 09/05/2015  . OSA (obstructive sleep apnea) 08/06/2015  . Exercise-induced asthma 04/29/2015  . GERD without esophagitis 04/29/2015  . Hx of abnormal cervical Papanicolaou smear 04/29/2015  . Degenerative arthritis of knee, bilateral 03/08/2015    Prior to Admission medications   Medication Sig Start Date End Date Taking? Authorizing Provider  albuterol (PROVENTIL HFA;VENTOLIN HFA) 108 (90 BASE) MCG/ACT inhaler Inhale 2 puffs into the lungs every 6 (six) hours as needed for wheezing or shortness of breath. 06/08/15  Yes Glean Hess, MD  celecoxib (CELEBREX) 200 MG capsule Take 200 mg by mouth 2 (two) times daily.   Yes Historical Provider, MD  ibuprofen (ADVIL,MOTRIN) 800 MG tablet Take 1 tablet (800 mg total) by mouth 3 (three) times daily. 04/29/15  Yes Olen Cordial, NP  omeprazole (PRILOSEC) 20 MG capsule Take 1 capsule (20  mg total) by mouth daily. 09/05/15  Yes Glean Hess, MD  temazepam (RESTORIL) 15 MG capsule Take 1 capsule (15 mg total) by mouth at bedtime as needed for sleep. 09/05/15  Yes Glean Hess, MD    No Known Allergies  Past Surgical History  Procedure Laterality Date  . Breast reduction surgery  1999  . Cervix lesion destruction  1998    abnormal PAP  . Reduction mammaplasty Bilateral 1999    Social History  Substance Use Topics  . Smoking status: Never Smoker   . Smokeless tobacco: None  . Alcohol Use: 0.6 oz/week    1 Glasses of wine per week     Medication list has been reviewed and updated.   Physical Exam  Constitutional: She is oriented to person, place, and time. She appears well-developed. No distress.  HENT:  Head: Normocephalic and atraumatic.  Cardiovascular: Normal rate, regular rhythm and normal heart sounds.   Pulmonary/Chest: Effort normal. No respiratory distress. She has no wheezes.  Musculoskeletal: Normal range of motion.  Neurological: She is alert and oriented to person, place, and time.  Skin: Skin is warm and dry. No rash noted.     Psychiatric: She has a normal mood and affect. Her behavior is normal. Thought content normal.    BP 138/78 mmHg  Pulse 76  Ht 5\' 3"  (1.6 m)  Wt 279 lb (126.554 kg)  BMI 49.44 kg/m2  Assessment and Plan: 1. Tinea corporis - fluconazole (DIFLUCAN) 100 MG tablet; Take 1 tablet (100 mg total)  by mouth daily.  Dispense: 2 tablet; Refill: 0 - nystatin cream (MYCOSTATIN); Apply 1 application topically 2 (two) times daily.  Dispense: 30 g; Refill: 0  2. GERD without esophagitis Continue bid omprazole - omeprazole (PRILOSEC) 20 MG capsule; Take 1 capsule (20 mg total) by mouth 2 (two) times daily before a meal.  Dispense: 180 capsule; Refill: [place], MD [facility]  10/16/2015

## 2015-10-17 DIAGNOSIS — G4733 Obstructive sleep apnea (adult) (pediatric): Secondary | ICD-10-CM | POA: Diagnosis not present

## 2015-10-29 DIAGNOSIS — M170 Bilateral primary osteoarthritis of knee: Secondary | ICD-10-CM | POA: Diagnosis not present

## 2016-01-04 DIAGNOSIS — G4733 Obstructive sleep apnea (adult) (pediatric): Secondary | ICD-10-CM | POA: Diagnosis not present

## 2016-05-16 IMAGING — MG MM DIGITAL DIAGNOSTIC UNILAT*R* W/ TOMO W/ CAD
7 series · 8 of 15 positions shown · non-contrast
Comparison: 09/13/2015.

ADDENDUM:
Comparison is made with prior mammograms from [REDACTED] dated 03/06/2011. The small, oval, circumscribed nodule
located medially within the right breast is stable when compared
with the prior mammogram consistent with a benign finding.
CLINICAL DATA: Recall from screening mammography.

EXAM:
DIGITAL DIAGNOSTIC RIGHT MAMMOGRAM WITH 3D TOMOSYNTHESIS

[R CC]
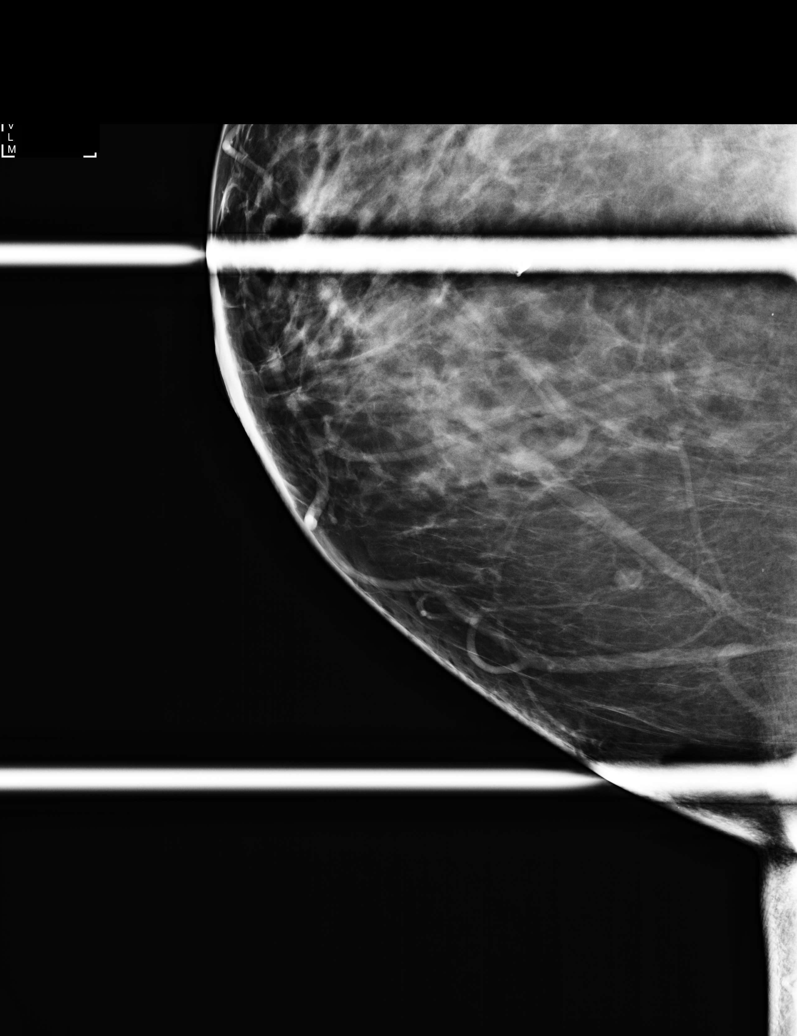

[R MLO synth-2D]
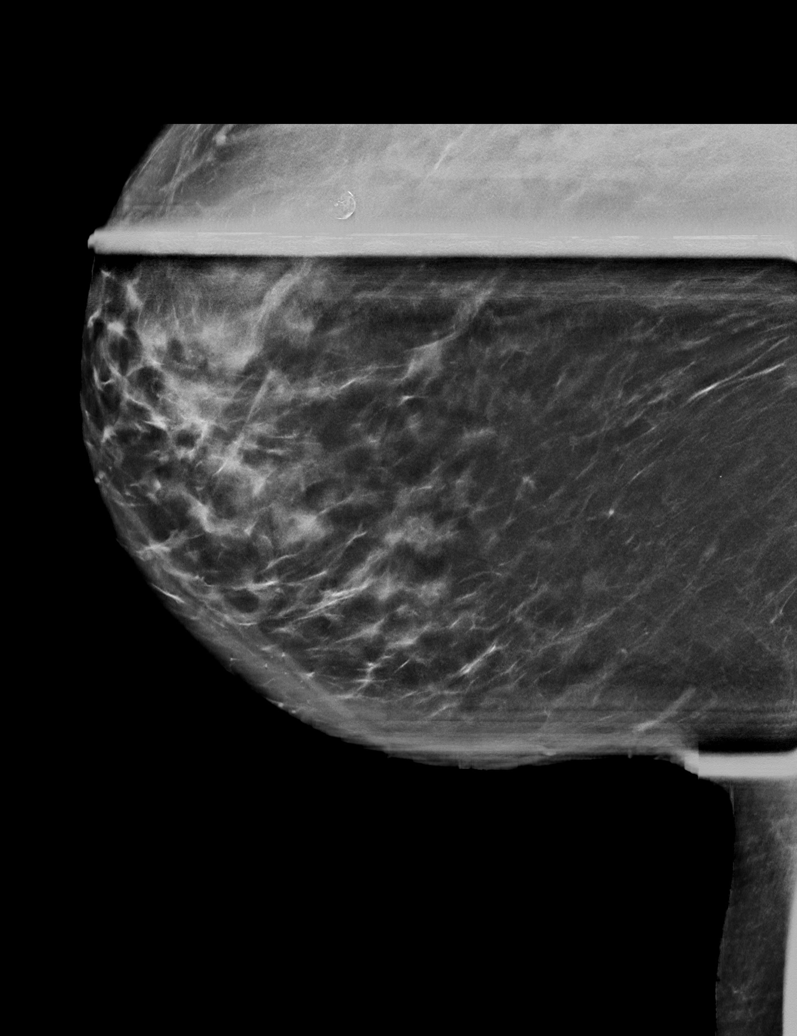

[R MLO]
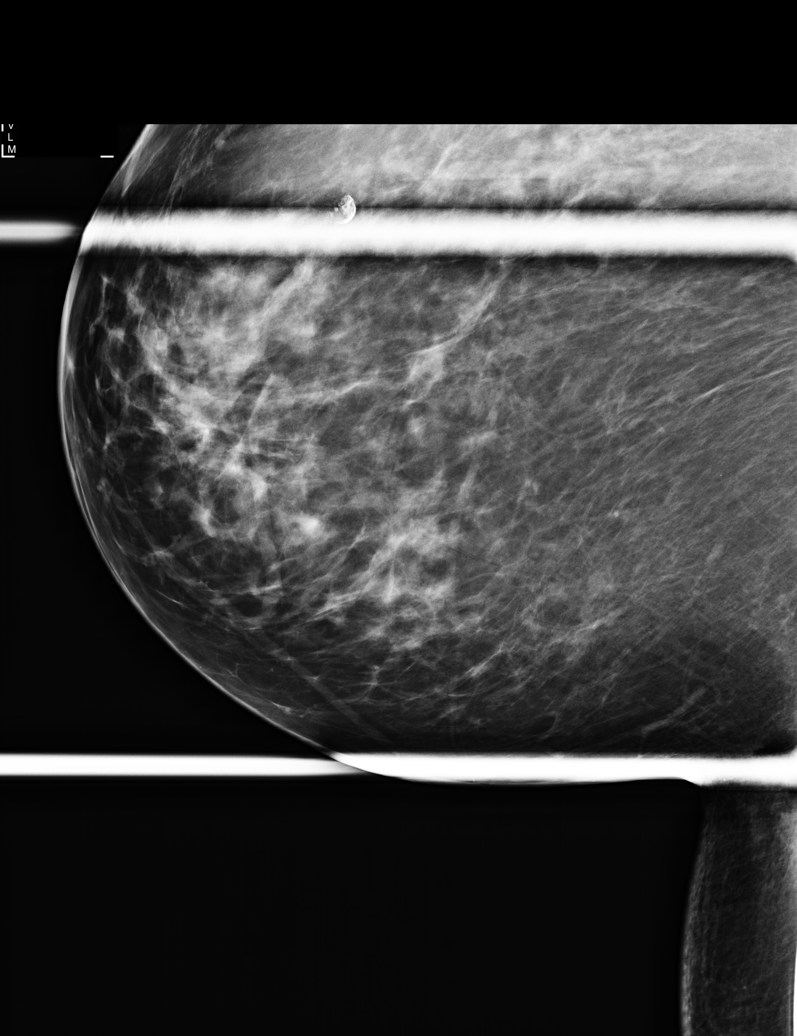

[R CC tomo (1 of 2)]
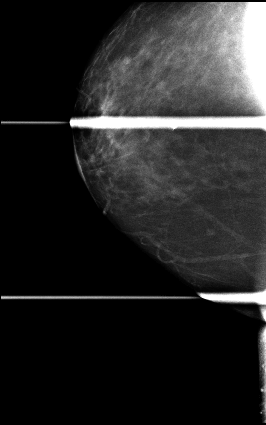

[R MLO tomo (1 of 2)]
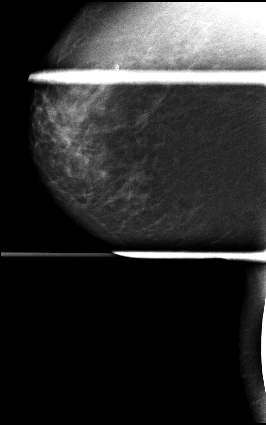

[R CC tomo · 2 of 90 frames shown (2 of 2)]
[frame 29/90]
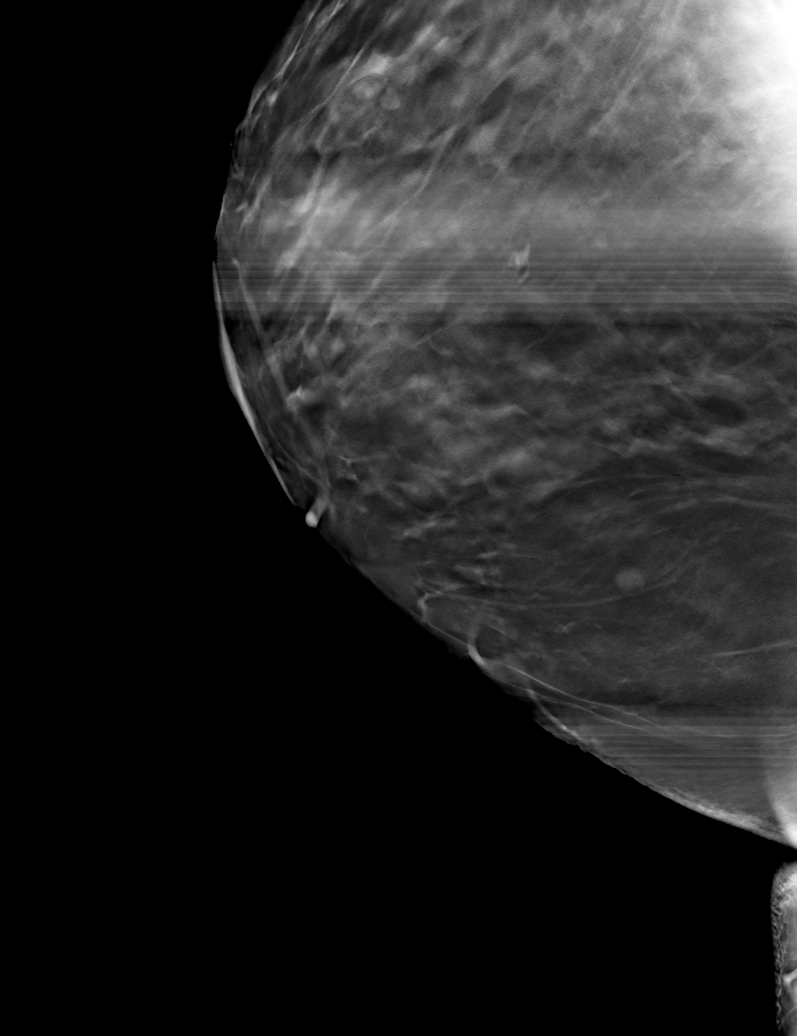
[frame 45/90]
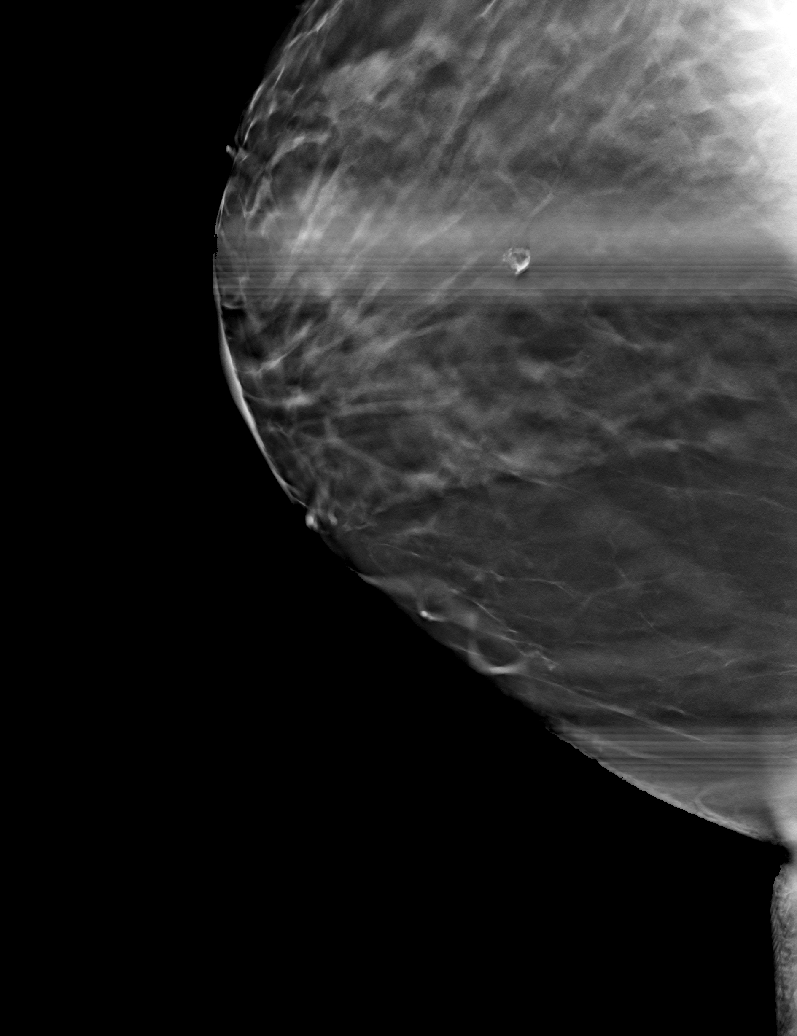

[R MLO tomo (2 of 2) · tomo slice 50/99.0]
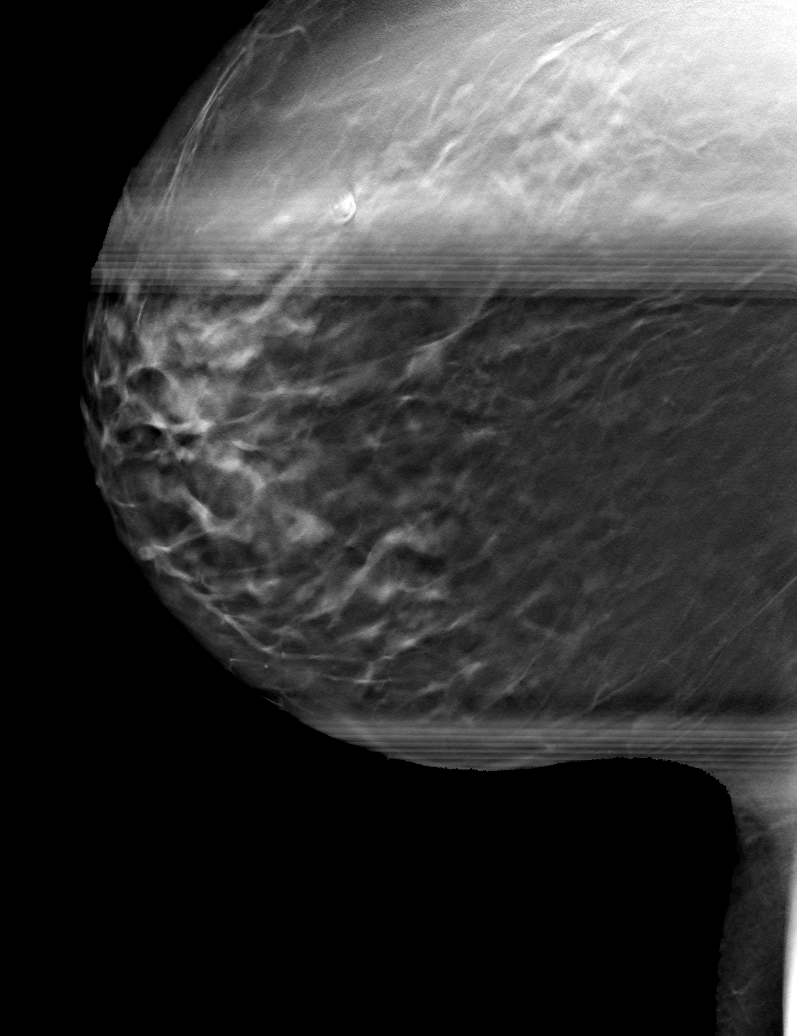

[8 of 15 positions shown; findings below may reference images not displayed]

IMPRESSION: Small, oval, circumscribed nodule located within the right breast
medially is stable when compared with prior mammograms dated
[DATE]/0808 0880 consistent with a benign finding. Recommend return
to screening mammography in 1 year.

Recommendation:  Bilateral screening mammography in 1 year.

2: Benign.
The patient does state that she has had
multiple prior mammograms at [facility] [HOSPITAL]. Attempts will be
made to obtain these prior studies.

ACR Breast Density Category b: There are scattered areas of
fibroglandular density.
FINDINGS: There is a small, oval, circumscribed nodule located within the
medial right breast (lower inner quadrant). There is no distortion
or worrisome calcification.

I recommend obtaining the patient's prior mammograms from [REDACTED]. If this is stable, no further evaluation may be needed.
IMPRESSION: Small, oval, circumscribed nodule located medially within the right
breast. Recommend obtaining previous mammograms from [REDACTED] as discussed above.

RECOMMENDATION:
Obtain previous mammograms from 2 [HOSPITAL].

I have discussed the findings and recommendations with the patient.
Results were also provided in writing at the conclusion of the
visit. If applicable, a reminder letter will be sent to the patient
regarding the next appointment.

BI-RADS CATEGORY  0: Incomplete. Need additional imaging evaluation
and/or prior mammograms for comparison.

## 2016-06-06 ENCOUNTER — Other Ambulatory Visit: Payer: Self-pay | Admitting: Internal Medicine

## 2016-06-06 DIAGNOSIS — G4733 Obstructive sleep apnea (adult) (pediatric): Secondary | ICD-10-CM

## 2016-06-12 NOTE — Telephone Encounter (Signed)
Patient is scheduled for CPE on 2/6 @ 830. Patient also wanted you to be aware that she will be having knee surgery on her left knee on 10/23 and her right knee on 12/28 by Dr. Leanor Kail.

## 2016-06-15 HISTORY — PX: TOTAL KNEE ARTHROPLASTY: SHX125

## 2016-06-30 DIAGNOSIS — Z01818 Encounter for other preprocedural examination: Secondary | ICD-10-CM | POA: Diagnosis not present

## 2016-06-30 DIAGNOSIS — Z01810 Encounter for preprocedural cardiovascular examination: Secondary | ICD-10-CM | POA: Diagnosis not present

## 2016-06-30 DIAGNOSIS — G4730 Sleep apnea, unspecified: Secondary | ICD-10-CM | POA: Diagnosis not present

## 2016-06-30 DIAGNOSIS — Z01812 Encounter for preprocedural laboratory examination: Secondary | ICD-10-CM | POA: Diagnosis not present

## 2016-06-30 DIAGNOSIS — M170 Bilateral primary osteoarthritis of knee: Secondary | ICD-10-CM | POA: Diagnosis not present

## 2016-07-07 DIAGNOSIS — M25561 Pain in right knee: Secondary | ICD-10-CM | POA: Diagnosis not present

## 2016-07-07 DIAGNOSIS — R110 Nausea: Secondary | ICD-10-CM | POA: Diagnosis not present

## 2016-07-07 DIAGNOSIS — J45909 Unspecified asthma, uncomplicated: Secondary | ICD-10-CM | POA: Diagnosis not present

## 2016-07-07 DIAGNOSIS — R52 Pain, unspecified: Secondary | ICD-10-CM | POA: Diagnosis not present

## 2016-07-07 DIAGNOSIS — G8918 Other acute postprocedural pain: Secondary | ICD-10-CM | POA: Diagnosis not present

## 2016-07-07 DIAGNOSIS — K219 Gastro-esophageal reflux disease without esophagitis: Secondary | ICD-10-CM | POA: Diagnosis not present

## 2016-07-07 DIAGNOSIS — M1712 Unilateral primary osteoarthritis, left knee: Secondary | ICD-10-CM | POA: Diagnosis not present

## 2016-07-07 DIAGNOSIS — M25562 Pain in left knee: Secondary | ICD-10-CM | POA: Diagnosis not present

## 2016-07-12 DIAGNOSIS — Z6841 Body Mass Index (BMI) 40.0 and over, adult: Secondary | ICD-10-CM | POA: Diagnosis not present

## 2016-07-12 DIAGNOSIS — J45909 Unspecified asthma, uncomplicated: Secondary | ICD-10-CM | POA: Diagnosis not present

## 2016-07-12 DIAGNOSIS — K219 Gastro-esophageal reflux disease without esophagitis: Secondary | ICD-10-CM | POA: Diagnosis not present

## 2016-07-12 DIAGNOSIS — Z7901 Long term (current) use of anticoagulants: Secondary | ICD-10-CM | POA: Diagnosis not present

## 2016-07-12 DIAGNOSIS — G4730 Sleep apnea, unspecified: Secondary | ICD-10-CM | POA: Diagnosis not present

## 2016-07-12 DIAGNOSIS — Z96652 Presence of left artificial knee joint: Secondary | ICD-10-CM | POA: Diagnosis not present

## 2016-07-12 DIAGNOSIS — M1711 Unilateral primary osteoarthritis, right knee: Secondary | ICD-10-CM | POA: Diagnosis not present

## 2016-07-12 DIAGNOSIS — Z471 Aftercare following joint replacement surgery: Secondary | ICD-10-CM | POA: Diagnosis not present

## 2016-07-12 DIAGNOSIS — Z79891 Long term (current) use of opiate analgesic: Secondary | ICD-10-CM | POA: Diagnosis not present

## 2016-07-14 DIAGNOSIS — G4730 Sleep apnea, unspecified: Secondary | ICD-10-CM | POA: Diagnosis not present

## 2016-07-14 DIAGNOSIS — K219 Gastro-esophageal reflux disease without esophagitis: Secondary | ICD-10-CM | POA: Diagnosis not present

## 2016-07-14 DIAGNOSIS — M1711 Unilateral primary osteoarthritis, right knee: Secondary | ICD-10-CM | POA: Diagnosis not present

## 2016-07-14 DIAGNOSIS — Z79891 Long term (current) use of opiate analgesic: Secondary | ICD-10-CM | POA: Diagnosis not present

## 2016-07-14 DIAGNOSIS — Z7901 Long term (current) use of anticoagulants: Secondary | ICD-10-CM | POA: Diagnosis not present

## 2016-07-14 DIAGNOSIS — J45909 Unspecified asthma, uncomplicated: Secondary | ICD-10-CM | POA: Diagnosis not present

## 2016-07-14 DIAGNOSIS — Z471 Aftercare following joint replacement surgery: Secondary | ICD-10-CM | POA: Diagnosis not present

## 2016-07-14 DIAGNOSIS — Z96652 Presence of left artificial knee joint: Secondary | ICD-10-CM | POA: Diagnosis not present

## 2016-07-14 DIAGNOSIS — Z6841 Body Mass Index (BMI) 40.0 and over, adult: Secondary | ICD-10-CM | POA: Diagnosis not present

## 2016-07-16 DIAGNOSIS — Z471 Aftercare following joint replacement surgery: Secondary | ICD-10-CM | POA: Diagnosis not present

## 2016-07-16 DIAGNOSIS — Z96652 Presence of left artificial knee joint: Secondary | ICD-10-CM | POA: Diagnosis not present

## 2016-07-16 DIAGNOSIS — M1711 Unilateral primary osteoarthritis, right knee: Secondary | ICD-10-CM | POA: Diagnosis not present

## 2016-07-16 DIAGNOSIS — Z6841 Body Mass Index (BMI) 40.0 and over, adult: Secondary | ICD-10-CM | POA: Diagnosis not present

## 2016-07-16 DIAGNOSIS — G4730 Sleep apnea, unspecified: Secondary | ICD-10-CM | POA: Diagnosis not present

## 2016-07-16 DIAGNOSIS — K219 Gastro-esophageal reflux disease without esophagitis: Secondary | ICD-10-CM | POA: Diagnosis not present

## 2016-07-16 DIAGNOSIS — Z79891 Long term (current) use of opiate analgesic: Secondary | ICD-10-CM | POA: Diagnosis not present

## 2016-07-16 DIAGNOSIS — J45909 Unspecified asthma, uncomplicated: Secondary | ICD-10-CM | POA: Diagnosis not present

## 2016-07-16 DIAGNOSIS — Z7901 Long term (current) use of anticoagulants: Secondary | ICD-10-CM | POA: Diagnosis not present

## 2016-07-18 DIAGNOSIS — Z96652 Presence of left artificial knee joint: Secondary | ICD-10-CM | POA: Diagnosis not present

## 2016-07-18 DIAGNOSIS — Z7901 Long term (current) use of anticoagulants: Secondary | ICD-10-CM | POA: Diagnosis not present

## 2016-07-18 DIAGNOSIS — J45909 Unspecified asthma, uncomplicated: Secondary | ICD-10-CM | POA: Diagnosis not present

## 2016-07-18 DIAGNOSIS — Z79891 Long term (current) use of opiate analgesic: Secondary | ICD-10-CM | POA: Diagnosis not present

## 2016-07-18 DIAGNOSIS — Z6841 Body Mass Index (BMI) 40.0 and over, adult: Secondary | ICD-10-CM | POA: Diagnosis not present

## 2016-07-18 DIAGNOSIS — K219 Gastro-esophageal reflux disease without esophagitis: Secondary | ICD-10-CM | POA: Diagnosis not present

## 2016-07-18 DIAGNOSIS — M1711 Unilateral primary osteoarthritis, right knee: Secondary | ICD-10-CM | POA: Diagnosis not present

## 2016-07-18 DIAGNOSIS — Z471 Aftercare following joint replacement surgery: Secondary | ICD-10-CM | POA: Diagnosis not present

## 2016-07-18 DIAGNOSIS — G4730 Sleep apnea, unspecified: Secondary | ICD-10-CM | POA: Diagnosis not present

## 2016-07-21 DIAGNOSIS — Z79891 Long term (current) use of opiate analgesic: Secondary | ICD-10-CM | POA: Diagnosis not present

## 2016-07-21 DIAGNOSIS — Z6841 Body Mass Index (BMI) 40.0 and over, adult: Secondary | ICD-10-CM | POA: Diagnosis not present

## 2016-07-21 DIAGNOSIS — Z96652 Presence of left artificial knee joint: Secondary | ICD-10-CM | POA: Diagnosis not present

## 2016-07-21 DIAGNOSIS — Z7901 Long term (current) use of anticoagulants: Secondary | ICD-10-CM | POA: Diagnosis not present

## 2016-07-21 DIAGNOSIS — M1711 Unilateral primary osteoarthritis, right knee: Secondary | ICD-10-CM | POA: Diagnosis not present

## 2016-07-21 DIAGNOSIS — K219 Gastro-esophageal reflux disease without esophagitis: Secondary | ICD-10-CM | POA: Diagnosis not present

## 2016-07-21 DIAGNOSIS — Z471 Aftercare following joint replacement surgery: Secondary | ICD-10-CM | POA: Diagnosis not present

## 2016-07-21 DIAGNOSIS — G4730 Sleep apnea, unspecified: Secondary | ICD-10-CM | POA: Diagnosis not present

## 2016-07-21 DIAGNOSIS — J45909 Unspecified asthma, uncomplicated: Secondary | ICD-10-CM | POA: Diagnosis not present

## 2016-07-22 ENCOUNTER — Ambulatory Visit: Payer: 59 | Admitting: Physical Therapy

## 2016-07-22 DIAGNOSIS — Z96652 Presence of left artificial knee joint: Secondary | ICD-10-CM | POA: Diagnosis not present

## 2016-07-22 DIAGNOSIS — M1712 Unilateral primary osteoarthritis, left knee: Secondary | ICD-10-CM | POA: Diagnosis not present

## 2016-07-23 DIAGNOSIS — Z471 Aftercare following joint replacement surgery: Secondary | ICD-10-CM | POA: Diagnosis not present

## 2016-07-23 DIAGNOSIS — G4730 Sleep apnea, unspecified: Secondary | ICD-10-CM | POA: Diagnosis not present

## 2016-07-23 DIAGNOSIS — M1711 Unilateral primary osteoarthritis, right knee: Secondary | ICD-10-CM | POA: Diagnosis not present

## 2016-07-23 DIAGNOSIS — Z7901 Long term (current) use of anticoagulants: Secondary | ICD-10-CM | POA: Diagnosis not present

## 2016-07-23 DIAGNOSIS — K219 Gastro-esophageal reflux disease without esophagitis: Secondary | ICD-10-CM | POA: Diagnosis not present

## 2016-07-23 DIAGNOSIS — J45909 Unspecified asthma, uncomplicated: Secondary | ICD-10-CM | POA: Diagnosis not present

## 2016-07-23 DIAGNOSIS — Z79891 Long term (current) use of opiate analgesic: Secondary | ICD-10-CM | POA: Diagnosis not present

## 2016-07-23 DIAGNOSIS — Z96652 Presence of left artificial knee joint: Secondary | ICD-10-CM | POA: Diagnosis not present

## 2016-07-23 DIAGNOSIS — Z6841 Body Mass Index (BMI) 40.0 and over, adult: Secondary | ICD-10-CM | POA: Diagnosis not present

## 2016-07-24 DIAGNOSIS — Z6841 Body Mass Index (BMI) 40.0 and over, adult: Secondary | ICD-10-CM | POA: Diagnosis not present

## 2016-07-24 DIAGNOSIS — M1711 Unilateral primary osteoarthritis, right knee: Secondary | ICD-10-CM | POA: Diagnosis not present

## 2016-07-24 DIAGNOSIS — Z96652 Presence of left artificial knee joint: Secondary | ICD-10-CM | POA: Diagnosis not present

## 2016-07-24 DIAGNOSIS — Z79891 Long term (current) use of opiate analgesic: Secondary | ICD-10-CM | POA: Diagnosis not present

## 2016-07-24 DIAGNOSIS — Z471 Aftercare following joint replacement surgery: Secondary | ICD-10-CM | POA: Diagnosis not present

## 2016-07-24 DIAGNOSIS — Z7901 Long term (current) use of anticoagulants: Secondary | ICD-10-CM | POA: Diagnosis not present

## 2016-07-24 DIAGNOSIS — K219 Gastro-esophageal reflux disease without esophagitis: Secondary | ICD-10-CM | POA: Diagnosis not present

## 2016-07-24 DIAGNOSIS — J45909 Unspecified asthma, uncomplicated: Secondary | ICD-10-CM | POA: Diagnosis not present

## 2016-07-24 DIAGNOSIS — G4730 Sleep apnea, unspecified: Secondary | ICD-10-CM | POA: Diagnosis not present

## 2016-07-28 ENCOUNTER — Ambulatory Visit: Payer: 59 | Attending: Unknown Physician Specialty | Admitting: Physical Therapy

## 2016-07-28 ENCOUNTER — Encounter: Payer: Self-pay | Admitting: Physical Therapy

## 2016-07-28 DIAGNOSIS — M25562 Pain in left knee: Secondary | ICD-10-CM | POA: Diagnosis not present

## 2016-07-28 DIAGNOSIS — G8929 Other chronic pain: Secondary | ICD-10-CM | POA: Insufficient documentation

## 2016-07-28 DIAGNOSIS — R262 Difficulty in walking, not elsewhere classified: Secondary | ICD-10-CM | POA: Insufficient documentation

## 2016-07-28 DIAGNOSIS — M25662 Stiffness of left knee, not elsewhere classified: Secondary | ICD-10-CM | POA: Diagnosis not present

## 2016-07-28 DIAGNOSIS — M6281 Muscle weakness (generalized): Secondary | ICD-10-CM | POA: Insufficient documentation

## 2016-07-28 NOTE — Therapy (Signed)
[facility] [address] [place], [place], [address] Phone: [phone]   Fax:  [phone]  Physical Therapy Evaluation  Patient Details  Name: Kaitlyn Mcdaniel MRN: [id number] Date of Birth: 1971-07-13 Referring Provider: Dr. Jones Bales.   Encounter Date: 07/28/2016      PT End of Session - 07/28/16 1308    Visit Number 1   Number of Visits 12   Date for PT Re-Evaluation 09/08/16   PT Start Time 1055   PT Stop Time 1156   PT Time Calculation (min) 61 min   Activity Tolerance Patient tolerated treatment well;Patient limited by pain   Behavior During Therapy [facility] for tasks assessed/performed      History reviewed. No pertinent past medical history.  Past Surgical History:  Procedure Laterality Date  . BREAST REDUCTION SURGERY  1999  . CERVIX LESION DESTRUCTION  1998   abnormal PAP  . REDUCTION MAMMAPLASTY Bilateral 1999    There were no vitals filed for this visit.       Subjective Assessment - 07/28/16 1059    Subjective Pt. s/p L TKA on 07/07/16.  Pt. planning on having R TKA on 09/11/16.  Pt. 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  Pt. states HHPT was able to get 0 to 98 deg. L knee AROM.     Patient is accompained by: Family member   Pertinent History 19 stairs at home and has been able to complete since surgery.     Limitations Lifting;Standing;Sitting;Walking;House hold activities;Other (comment)   Patient Stated Goals Increase L knee AROM/ strength to improve mobility/ return to work.     Currently in Pain? Yes   Pain Score 3    Pain Location Knee   Pain Orientation Left   Pain Type Surgical pain   Aggravating Factors  10/10 pain "shooting" at night.              [facility] PT Assessment - 07/28/16 0001      Assessment   Medical Diagnosis L TKA   Referring Provider Dr. Franchot Mimes, Brooke Bonito.    Onset Date/Surgical Date 07/07/16   Next MD Visit  --  08/05/16   Prior Therapy HHPT     Balance Screen   Has the patient fallen in the past 6 months No   Is the patient reluctant to leave their home because of a fear of falling?  No     Reviewed HEP/ supine L knee A/PROM flexion 5x with static holds.  Nustep L5 seat position #9-7 12 min.        PT Education - 07/28/16 1308    Education provided Yes   Education Details Reviewed HHPT ex. program.     Person(s) Educated Patient   Methods Explanation;Demonstration   Comprehension Verbalized understanding;Returned demonstration             PT Long Term Goals - 07/28/16 1327      PT LONG TERM GOAL #1   Title Pt. independent with HEP to increase L knee AROM (0 to 115 deg.) to improve pain-free mobility.     Baseline L knee AROM -4 to 97 deg.     Time 6   Period Weeks   Status New     PT LONG TERM GOAL #2   Title Pt. will increase LEFS to >60 out of 80 to improve pain-free mobility/ return to work.  Baseline LEFS: 41 out of 80 on 07/28/16   Time 6   Period Weeks   Status New     PT LONG TERM GOAL #3   Title Pt. able to ambulate community distance with normalized gait pattern on L with no assistive device safely to improve daily mobility.    Baseline Moderate L antalgic gait pattern with use of SPC.  Decrease L knee flexion/ step pattern.    Time 6   Period Weeks   Status New     PT LONG TERM GOAL #4   Title Pt. able to ascend/descend 19 stairs at home with recip. pattern and no UE assist safety to improve ability to get to 2nd floor bedroom.     Baseline step to pattern with use of UE for safety.     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     Baseline currently out of work   Time 3   Period Weeks   Status New            Plan - 07/28/16 1309    Clinical Impression Statement Pt. is a pleasant 45 y/o female s/p L TKA (Makoplasty) on 07/07/16.   Pt. reports 3/10 L knee pain currently at rest/best and >10/10 at worst (other night with shooting pain symptoms).  R knee AROM (0 to 126 deg.).  L knee AROM (-4 to 97 deg.).  Slight swelling/ warmth in L LE:  Circumferential measurements (L/R): joint line (54.5/53.5 cm), distal quad. (63/61 cm.), mid-gastroc. (49/51 cm.).  Pt. ambulates with mod. L antalgic gait pattern with use of SPC on R.  Pt. has to focus on L hip/knee flexion and heel strike with each step.  Ascend/descends stairs with step to gait pattern and 1 handrail assist for safety.  Unable to descends with recip. pattern due to L knee pain/ joint stiffness.  Pt. will benefit from skilled PT services to increase L knee ROM/ stability to improve normalized gait and return to work.     Rehab Potential Good   PT Frequency 2x / week   PT Duration 6 weeks   PT Treatment/Interventions ADLs/Self Care Home Management;Aquatic Therapy;Cryotherapy;Electrical Stimulation;Gait training;Stair training;Functional mobility training;Therapeutic activities;Therapeutic exercise;Patient/family education;Neuromuscular re-education;Balance training;Manual techniques;Passive range of motion;Scar mobilization   PT Next Visit Plan Issue new HEP/ progress B LE strengthening ex./ increase L knee ROM (flexion).     PT Home Exercise Plan See handouts   Consulted and Agree with Plan of Care Patient      Patient will benefit from skilled therapeutic intervention in order to improve the following deficits and impairments:  Abnormal gait, Improper body mechanics, Pain, Postural dysfunction, Decreased mobility, Decreased activity tolerance, Decreased endurance, Decreased range of motion, Decreased strength, Hypomobility, Impaired flexibility, Difficulty walking, Decreased safety awareness  Visit Diagnosis: Joint stiffness of knee, left  Muscle weakness (generalized)  Chronic pain of left knee  Difficulty in walking, not elsewhere classified     Problem  List Patient Active Problem List   Diagnosis Date Noted  . Elevated blood sugar 09/05/2015  . OSA (obstructive sleep apnea) 08/06/2015  . Exercise-induced asthma 04/29/2015  . GERD without esophagitis 04/29/2015  . Hx of abnormal cervical Papanicolaou smear 04/29/2015  . Degenerative arthritis of knee, bilateral 03/08/2015   Pura Spice, PT, DPT # [phone] 07/28/2016, 1:41 PM  [facility] [address] [place], [place], [address] Phone: [phone]   Fax:  [phone]  Name: Kaitlyn Mcdaniel MRN: [id number] Date of Birth: 1970-12-01

## 2016-07-30 ENCOUNTER — Ambulatory Visit: Payer: 59 | Admitting: Physical Therapy

## 2016-07-30 ENCOUNTER — Encounter: Payer: Self-pay | Admitting: Physical Therapy

## 2016-07-30 DIAGNOSIS — R262 Difficulty in walking, not elsewhere classified: Secondary | ICD-10-CM | POA: Diagnosis not present

## 2016-07-30 DIAGNOSIS — M25562 Pain in left knee: Secondary | ICD-10-CM

## 2016-07-30 DIAGNOSIS — M25662 Stiffness of left knee, not elsewhere classified: Secondary | ICD-10-CM | POA: Diagnosis not present

## 2016-07-30 DIAGNOSIS — M6281 Muscle weakness (generalized): Secondary | ICD-10-CM | POA: Diagnosis not present

## 2016-07-30 DIAGNOSIS — G8929 Other chronic pain: Secondary | ICD-10-CM

## 2016-07-30 NOTE — Therapy (Signed)
[place] [facility] [address] [place], [place], [address] Phone: [phone]   Fax:  [phone]  Physical Therapy Treatment  Patient Details  Name: Kaitlyn Mcdaniel MRN: [id number] Date of Birth: [birth year] Referring Provider: Dr. Jones Bales.   Encounter Date: 07/30/2016      PT End of Session - 07/30/16 1727    Visit Number 2   Number of Visits 12   Date for PT Re-Evaluation 09/08/16   PT Start Time 0907   PT Stop Time 1005   PT Time Calculation (min) 58 min   Activity Tolerance Patient tolerated treatment well;Patient limited by pain   Behavior During Therapy [facility] for tasks assessed/performed      History reviewed. No pertinent past medical history.  Past Surgical History:  Procedure Laterality Date  . BREAST REDUCTION SURGERY  1999  . CERVIX LESION DESTRUCTION  1998   abnormal PAP  . REDUCTION MAMMAPLASTY Bilateral 1999    There were no vitals filed for this visit.      Subjective Assessment - 07/30/16 1726    Subjective Pt. states she has more stiffness in L knee, not really pain.  Pt. states she was able to sleep well last night with assist from sleeping pill.  Pt. entered PT with use of SPC and slight L antalgic gait pattern.     Patient is accompained by: Family member   Pertinent History 19 stairs at home and has been able to complete since surgery.     Limitations Lifting;Standing;Sitting;Walking;House hold activities;Other (comment)   Patient Stated Goals Increase L knee AROM/ strength to improve mobility/ return to work.     Currently in Pain? No/denies       OBJECTIVE:  There.ex.: Nustep L6 10 min. Seat #9-7 (warm-up/ no charge).  Ambulate around PT clinic with use of SPC and instruction on hip/knee flexion/ heel strike and consistent step pattern (slight L hip hike/ R lateral lean due to limited knee flexion).  Standing 2nd step lunges 8x on L.  Standing hip ex. (marching)/ seated LAQ with 2.5#  (issued ankle wts. For HEP)- 20x.  Supine SAQ with bolster during STM to L distal quad and instruction in scar massage (good healing).  TG knee flexion 3 min. And heel raises 2 min. (as tolerated).  Manual tx.:  Supine L patella mobs. (all planes) and A/AROM flexion 6x with holds (108 deg. Flexion)- marked increase (pain limited).  Ice to L knee in sitting after tx.     Pt response for medical necessity:  Pt. Benefits from skilled PT services to increase L knee ROM/ stability to promote return to work/ independent functional mobility.          PT Long Term Goals - 07/28/16 1327      PT LONG TERM GOAL #1   Title Pt. independent with HEP to increase L knee AROM (0 to 115 deg.) to improve pain-free mobility.     Baseline L knee AROM -4 to 97 deg.     Time 6   Period Weeks   Status New     PT LONG TERM GOAL #2   Title Pt. will increase LEFS to >60 out of 80 to improve pain-free mobility/ return to work.     Baseline LEFS: 41 out of 80 on 07/28/16   Time 6   Period Weeks   Status New     PT LONG TERM GOAL #3   Title Pt. able to ambulate community  distance with normalized gait pattern on L with no assistive device safely to improve daily mobility.    Baseline Moderate L antalgic gait pattern with use of SPC.  Decrease L knee flexion/ step pattern.    Time 6   Period Weeks   Status New     PT LONG TERM GOAL #4   Title Pt. able to ascend/descend 19 stairs at home with recip. pattern and no UE assist safety to improve ability to get to 2nd floor bedroom.     Baseline step to pattern with use of UE for safety.     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     Baseline currently out of work   Time 3   Period Weeks   Status New               Plan - 07/30/16 1729    Clinical Impression Statement Pt. has shown marked increase in L knee flexion since initial evaluation  2 day ago with -4 to 108 deg. knee AROM after manual tx. in supine position.  Pt. presents with good incision healing with instruction in proper scar massage.  Pt. has muscle weakness noted in L quad/LE as compared to R and demonstrates good technique with HEP.  PT added 2.5# ankle wts. to HEP to progress strengthening/ muscle endurance to promote return to work on 08/18/16.      Rehab Potential Good   PT Frequency 2x / week   PT Duration 6 weeks   PT Treatment/Interventions ADLs/Self Care Home Management;Aquatic Therapy;Cryotherapy;Electrical Stimulation;Gait training;Stair training;Functional mobility training;Therapeutic activities;Therapeutic exercise;Patient/family education;Neuromuscular re-education;Balance training;Manual techniques;Passive range of motion;Scar mobilization   PT Next Visit Plan Increase L knee ROM (flexion focus) and strengthening.  No new HEP issued but added 2.5# ankle wts.    PT Home Exercise Plan See handouts   Consulted and Agree with Plan of Care Patient      Patient will benefit from skilled therapeutic intervention in order to improve the following deficits and impairments:  Abnormal gait, Improper body mechanics, Pain, Postural dysfunction, Decreased mobility, Decreased activity tolerance, Decreased endurance, Decreased range of motion, Decreased strength, Hypomobility, Impaired flexibility, Difficulty walking, Decreased safety awareness  Visit Diagnosis: Joint stiffness of knee, left  Muscle weakness (generalized)  Chronic pain of left knee  Difficulty in walking, not elsewhere classified     Problem List Patient Active Problem List   Diagnosis Date Noted  . Elevated blood sugar 09/05/2015  . OSA (obstructive sleep apnea) 08/06/2015  . Exercise-induced asthma 04/29/2015  . GERD without esophagitis 04/29/2015  . Hx of abnormal cervical Papanicolaou smear 04/29/2015  . Degenerative arthritis of knee, bilateral 03/08/2015   Pura Spice, PT, DPT #  [phone] 07/30/2016, 5:35 PM  [facility] [address] [place], [place], [address] Phone: [phone]   Fax:  [phone]  Name: Kaitlyn Mcdaniel MRN: [id number] Date of Birth: [birth year]

## 2016-08-04 ENCOUNTER — Ambulatory Visit: Payer: 59

## 2016-08-04 DIAGNOSIS — M6281 Muscle weakness (generalized): Secondary | ICD-10-CM

## 2016-08-04 DIAGNOSIS — M25662 Stiffness of left knee, not elsewhere classified: Secondary | ICD-10-CM | POA: Diagnosis not present

## 2016-08-04 DIAGNOSIS — R262 Difficulty in walking, not elsewhere classified: Secondary | ICD-10-CM | POA: Diagnosis not present

## 2016-08-04 DIAGNOSIS — G8929 Other chronic pain: Secondary | ICD-10-CM | POA: Diagnosis not present

## 2016-08-04 DIAGNOSIS — M25562 Pain in left knee: Secondary | ICD-10-CM | POA: Diagnosis not present

## 2016-08-04 NOTE — Therapy (Signed)
[place] [facility] [address] [place], [place], [address] Phone: [phone]   Fax:  [phone]  Physical Therapy Treatment  Patient Details  Name: Kaitlyn Mcdaniel MRN: [id number] Date of Birth: [birth year] Referring Provider: Dr. Jones Bales.   Encounter Date: 08/04/2016      PT End of Session - 08/04/16 1334    Visit Number 3   Number of Visits 12   Date for PT Re-Evaluation 09/08/16   PT Start Time 1120   PT Stop Time 1205   PT Time Calculation (min) 45 min   Activity Tolerance Patient tolerated treatment well;Patient limited by pain   Behavior During Therapy [facility] for tasks assessed/performed      History reviewed. No pertinent past medical history.  Past Surgical History:  Procedure Laterality Date  . BREAST REDUCTION SURGERY  1999  . CERVIX LESION DESTRUCTION  1998   abnormal PAP  . REDUCTION MAMMAPLASTY Bilateral 1999    There were no vitals filed for this visit.      Subjective Assessment - 08/04/16 1332    Subjective Pt denies pain upon arrival but reports some L knee stiffness. She is performing HEP without issue. Pt reports she feels like she has lost some L knee extension ROM. Otherwise no specific questions or concerns.    Patient is accompained by: Family member   Pertinent History 19 stairs at home and has been able to complete since surgery.     Limitations Lifting;Standing;Sitting;Walking;House hold activities;Other (comment)   Patient Stated Goals Increase L knee AROM/ strength to improve mobility/ return to work.     Currently in Pain? No/denies       OBJECTIVE:    There.ex:  Nustep L3 x 4 minutes, L6 x 2 minutes. Seat #8 (warm-up/ no charge).   Total gym bilateral squats 2 x 10 within tolerable range; Total gym bilateral heel raises 2 x 10; Forward step-up to 6" step leading with LLE for ascend and RLE for backwards descent, cues to slow descent to improve eccentric L quad control x  10; Standing BOSU forward lunges leading with LLE x 10;  Manual tx. Supine L patella mobs.(all planes), grade III, 2 bouts in each direction; Supine L knee extension stretch with gentle overpressure to patient's tolerance 15-20 second hold x 3; Prone L knee MET extension stretch 5 sec hold, 5 second stretch progressing knee extension as tolerated x multiple bouts; Supine L knee extension femur on tibia AP mobilizations grade 2-3 as pt tolerates 30 second/bout x 3 bouts; Scar massage in multiple patters to improve scar elasticity/mobility, pt with adhesions particularly below knee x 8 minutes;  Pt response for medical necessity:  Pt. Benefits from skilled PT services to increase L knee ROM/ stability to promote return to work/ independent functional mobility.                         PT Education - 08/04/16 1333    Education provided Yes   Education Details Low load long duration stretches for extension, proper positioning with towel roll under heel   Person(s) Educated Patient   Methods Explanation   Comprehension Verbalized understanding             PT Long Term Goals - 07/28/16 1327      PT LONG TERM GOAL #1   Title Pt. independent with HEP to increase L knee AROM (0 to 115 deg.) to improve pain-free mobility.  Baseline L knee AROM -4 to 97 deg.     Time 6   Period Weeks   Status New     PT LONG TERM GOAL #2   Title Pt. will increase LEFS to >60 out of 80 to improve pain-free mobility/ return to work.     Baseline LEFS: 41 out of 80 on 07/28/16   Time 6   Period Weeks   Status New     PT LONG TERM GOAL #3   Title Pt. able to ambulate community distance with normalized gait pattern on L with no assistive device safely to improve daily mobility.    Baseline Moderate L antalgic gait pattern with use of SPC.  Decrease L knee flexion/ step pattern.    Time 6   Period Weeks   Status New     PT LONG TERM GOAL #4   Title Pt. able to ascend/descend 19  stairs at home with recip. pattern and no UE assist safety to improve ability to get to 2nd floor bedroom.     Baseline step to pattern with use of UE for safety.     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     Baseline currently out of work   Time 3   Period Weeks   Status New               Plan - 08/04/16 1334    Clinical Impression Statement Pt reports increased L knee pain with L knee extension stretches in supine as well as prone MET stretches. She demonstrates decreased scar mobility especially inferior to the knee. Scar is well-healed without signs of infection. Pt reports some increased R knee pain during total gym squats but does not limit participation. She also presents with decreased LLE stability during BOSU lunges. Pt educated about proper LLE positioning with towel roll under heel to improve knee extension. Discussed low load long duration stretches. Pt declines L knee flexion stretches on this date. Pt encouraged to continue with current HEP and follow-up as scheduled.    Rehab Potential Good   PT Frequency 2x / week   PT Duration 6 weeks   PT Treatment/Interventions ADLs/Self Care Home Management;Aquatic Therapy;Cryotherapy;Electrical Stimulation;Gait training;Stair training;Functional mobility training;Therapeutic activities;Therapeutic exercise;Patient/family education;Neuromuscular re-education;Balance training;Manual techniques;Passive range of motion;Scar mobilization   PT Next Visit Plan Increase L knee ROM (flexion focus) and strengthening.  Consider progressing HEP   PT Home Exercise Plan See handouts, as prescribed   Consulted and Agree with Plan of Care Patient      Patient will benefit from skilled therapeutic intervention in order to improve the following deficits and impairments:  Abnormal gait, Improper body mechanics, Pain, Postural  dysfunction, Decreased mobility, Decreased activity tolerance, Decreased endurance, Decreased range of motion, Decreased strength, Hypomobility, Impaired flexibility, Difficulty walking, Decreased safety awareness  Visit Diagnosis: Joint stiffness of knee, left  Muscle weakness (generalized)     Problem List Patient Active Problem List   Diagnosis Date Noted  . Elevated blood sugar 09/05/2015  . OSA (obstructive sleep apnea) 08/06/2015  . Exercise-induced asthma 04/29/2015  . GERD without esophagitis 04/29/2015  . Hx of abnormal cervical Papanicolaou smear 04/29/2015  . Degenerative arthritis of knee, bilateral 03/08/2015   Phillips Grout PT, DPT   Huprich,Jason 08/04/2016, 1:42 PM  [facility] [address] [place], [place], [address] Phone: [phone]   Fax:  [phone]  Name: HADLIE GIPSON MRN: [id number] Date of Birth: 1971/02/09

## 2016-08-06 ENCOUNTER — Ambulatory Visit: Payer: 59

## 2016-08-06 DIAGNOSIS — G8929 Other chronic pain: Secondary | ICD-10-CM

## 2016-08-06 DIAGNOSIS — R262 Difficulty in walking, not elsewhere classified: Secondary | ICD-10-CM

## 2016-08-06 DIAGNOSIS — M25662 Stiffness of left knee, not elsewhere classified: Secondary | ICD-10-CM | POA: Diagnosis not present

## 2016-08-06 DIAGNOSIS — M25562 Pain in left knee: Secondary | ICD-10-CM | POA: Diagnosis not present

## 2016-08-06 DIAGNOSIS — M6281 Muscle weakness (generalized): Secondary | ICD-10-CM | POA: Diagnosis not present

## 2016-08-06 NOTE — Therapy (Signed)
[facility] [address] [place], [place], [address] Phone: [phone]   Fax:  [phone]  Physical Therapy Treatment  Patient Details  Name: Kaitlyn Mcdaniel MRN: [id number] Date of Birth: Sep 04, 1971 Referring Provider: Dr. Jones Bales.   Encounter Date: 08/06/2016      PT End of Session - 08/06/16 1240    Visit Number 4   Number of Visits 12   Date for PT Re-Evaluation 09/08/16   PT Start Time 1115   PT Stop Time 1205   PT Time Calculation (min) 50 min   Activity Tolerance Patient tolerated treatment well;Patient limited by pain   Behavior During Therapy [facility] for tasks assessed/performed      No past medical history on file.  Past Surgical History:  Procedure Laterality Date  . BREAST REDUCTION SURGERY  1999  . CERVIX LESION DESTRUCTION  1998   abnormal PAP  . REDUCTION MAMMAPLASTY Bilateral 1999    There were no vitals filed for this visit.      Subjective Assessment - 08/06/16 1120    Subjective Pt reports that she had a follow-up with Reche Dixon and he was pleased with her progress. She is performing HEP without issue. Denies pain today. No specific questions or concerns currently.    Patient is accompained by: Family member   Pertinent History 19 stairs at home and has been able to complete since surgery.     Limitations Lifting;Standing;Sitting;Walking;House hold activities;Other (comment)   Patient Stated Goals Increase L knee AROM/ strength to improve mobility/ return to work.     Currently in Pain? No/denies       OBJECTIVE:   Manual tx. Supine L patella mobs.(all planes), grade III, 2 bouts in each direction; Supine L knee extension femur on tibia AP mobilizations grade 3 as pt tolerates 30 second/bout x 3 bouts to improve extension; Supine L knee AP tibia on femur mobilizations at available end range flexion, grade III, 30 seconds/bout x 3 bouts to improve flexion; Seated MET L knee  flexion stretch 5 seconds contract, 5 second hold x 3 for multiple bouts; L knee flexion measured following and is at 118 degrees of flexion with overpressure;  Neuromuscular Re-education Performed BERG (56/56) and DGI (24/24) with patient. Pt requesting to discontinue use of single point cane. Advised pt that she can dc use of cane.  There.ex Nustep L3 x 2 minutes, L5 x 3 minutes. Seat #9 (warm-up/ no charge).  Forward step-up to 6" step leading without UE support with LLE for ascending and RLE for backwards descending, cues to slow descent to improve eccentric L quad control x 10; Left lateral step-up to 6" step without UE support x 10; Backwards step-up to 6" step, leading with LLE during ascending and RLE during descending, pt requires unilateral UE support x 10; BOSU forward lunges leading with LLE 2  x 10; BOSU left lateral lunges 2 x 10;  Pt response for medical necessity: Pt. Benefits from skilled PT services to increase L knee ROM/ stability to promote return to work/ independent functional mobility.       [facility] PT Assessment - 08/06/16 1243      Assessment   Medical Diagnosis t     Standardized Balance Assessment   Standardized Balance Assessment Berg Balance Test;Dynamic Gait Index     Berg Balance Test   Sit to Stand Able to stand without using hands and stabilize independently   Standing Unsupported Able to stand safely  2 minutes   Sitting with Back Unsupported but Feet Supported on Floor or Stool Able to sit safely and securely 2 minutes   Stand to Sit Sits safely with minimal use of hands   Transfers Able to transfer safely, minor use of hands   Standing Unsupported with Eyes Closed Able to stand 10 seconds safely   Standing Ubsupported with Feet Together Able to place feet together independently and stand 1 minute safely   From Standing, Reach Forward with Outstretched Arm Can reach confidently >25 cm (10")   From Standing Position, Pick up Object from Floor Able  to pick up shoe safely and easily   From Standing Position, Turn to Look Behind Over each Shoulder Looks behind from both sides and weight shifts well   Turn 360 Degrees Able to turn 360 degrees safely in 4 seconds or less   Standing Unsupported, Alternately Place Feet on Step/Stool Able to stand independently and safely and complete 8 steps in 20 seconds   Standing Unsupported, One Foot in Front Able to place foot tandem independently and hold 30 seconds   Standing on One Leg Able to lift leg independently and hold > 10 seconds   Total Score 56     Dynamic Gait Index   Level Surface Normal   Change in Gait Speed Normal   Gait with Horizontal Head Turns Normal   Gait with Vertical Head Turns Normal   Gait and Pivot Turn Normal   Step Over Obstacle Normal   Step Around Obstacles Normal   Steps Normal   Total Score 24                             PT Education - 08/06/16 1240    Education provided Yes   Education Details HEP reinforced, cues for exercises, ROM progression   Person(s) Educated Patient   Methods Explanation   Comprehension Verbalized understanding             PT Long Term Goals - 07/28/16 1327      PT LONG TERM GOAL #1   Title Pt. independent with HEP to increase L knee AROM (0 to 115 deg.) to improve pain-free mobility.     Baseline L knee AROM -4 to 97 deg.     Time 6   Period Weeks   Status New     PT LONG TERM GOAL #2   Title Pt. will increase LEFS to >60 out of 80 to improve pain-free mobility/ return to work.     Baseline LEFS: 41 out of 80 on 07/28/16   Time 6   Period Weeks   Status New     PT LONG TERM GOAL #3   Title Pt. able to ambulate community distance with normalized gait pattern on L with no assistive device safely to improve daily mobility.    Baseline Moderate L antalgic gait pattern with use of SPC.  Decrease L knee flexion/ step pattern.    Time 6   Period Weeks   Status New     PT LONG TERM GOAL #4   Title  Pt. able to ascend/descend 19 stairs at home with recip. pattern and no UE assist safety to improve ability to get to 2nd floor bedroom.     Baseline step to pattern with use of UE for safety.     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     Baseline currently out of work   Time 3   Period Weeks   Status New               Plan - 08/06/16 1240    Clinical Impression Statement Pt demonstrates excellent progress with therapy on this date. Her L knee flexion is at 118 following manual therapy and stretches on this date. She reports return to full extension following last therapy session. Pt demonstrates improving stability on LLE during BOSU lunges. BERG is 56/56 and DGI is 24/24. Pt advised she can discontinue use of single point cane. Pt encouraged to continue HEP and follow-up as scheduled.    Rehab Potential Good   PT Frequency 2x / week   PT Duration 6 weeks   PT Treatment/Interventions ADLs/Self Care Home Management;Aquatic Therapy;Cryotherapy;Electrical Stimulation;Gait training;Stair training;Functional mobility training;Therapeutic activities;Therapeutic exercise;Patient/family education;Neuromuscular re-education;Balance training;Manual techniques;Passive range of motion;Scar mobilization   PT Next Visit Plan Increase L knee ROM (flexion focus) and strengthening.   PT Home Exercise Plan See handouts, as prescribed   Consulted and Agree with Plan of Care Patient      Patient will benefit from skilled therapeutic intervention in order to improve the following deficits and impairments:  Abnormal gait, Improper body mechanics, Pain, Postural dysfunction, Decreased mobility, Decreased activity tolerance, Decreased endurance, Decreased range of motion, Decreased strength, Hypomobility, Impaired flexibility, Difficulty walking, Decreased safety awareness  Visit  Diagnosis: Joint stiffness of knee, left  Muscle weakness (generalized)  Chronic pain of left knee  Difficulty in walking, not elsewhere classified     Problem List Patient Active Problem List   Diagnosis Date Noted  . Elevated blood sugar 09/05/2015  . OSA (obstructive sleep apnea) 08/06/2015  . Exercise-induced asthma 04/29/2015  . GERD without esophagitis 04/29/2015  . Hx of abnormal cervical Papanicolaou smear 04/29/2015  . Degenerative arthritis of knee, bilateral 03/08/2015   Phillips Grout PT, DPT   Huprich,Jason 08/06/2016, 12:50 PM  [place] [facility] [address] [place], [place], [address] Phone: [phone]   Fax:  [phone]  Name: ELYZABETH GOATLEY MRN: [id number] Date of Birth: 09-Feb-1971

## 2016-08-11 ENCOUNTER — Encounter: Payer: Self-pay | Admitting: Physical Therapy

## 2016-08-11 ENCOUNTER — Ambulatory Visit: Payer: 59 | Admitting: Physical Therapy

## 2016-08-11 DIAGNOSIS — M25562 Pain in left knee: Secondary | ICD-10-CM

## 2016-08-11 DIAGNOSIS — M25662 Stiffness of left knee, not elsewhere classified: Secondary | ICD-10-CM | POA: Diagnosis not present

## 2016-08-11 DIAGNOSIS — G8929 Other chronic pain: Secondary | ICD-10-CM

## 2016-08-11 DIAGNOSIS — R262 Difficulty in walking, not elsewhere classified: Secondary | ICD-10-CM

## 2016-08-11 DIAGNOSIS — M6281 Muscle weakness (generalized): Secondary | ICD-10-CM | POA: Diagnosis not present

## 2016-08-12 NOTE — Therapy (Signed)
[facility] [address] [place], [place], [address] Phone: [phone]   Fax:  [phone]  Physical Therapy Treatment  Patient Details  Name: Kaitlyn Mcdaniel MRN: [id number] Date of Birth: Jul 29, 1971 Referring Provider: Dr. Jones Bales.   Encounter Date: 08/11/2016      PT End of Session - 08/12/16 1256    Visit Number 5   Number of Visits 12   Date for PT Re-Evaluation 09/08/16   PT Start Time 1112   PT Stop Time 1206   PT Time Calculation (min) 54 min   Activity Tolerance Patient tolerated treatment well   Behavior During Therapy [facility] for tasks assessed/performed      History reviewed. No pertinent past medical history.  Past Surgical History:  Procedure Laterality Date  . BREAST REDUCTION SURGERY  1999  . CERVIX LESION DESTRUCTION  1998   abnormal PAP  . REDUCTION MAMMAPLASTY Bilateral 1999    There were no vitals filed for this visit.      Subjective Assessment - 08/11/16 1429    Subjective Pt. states she is doing well and reports 2/10 L knee pain.  Pt. is planning on having R TKA on 09/11/16.  Pt. scheduled to return to work on 08/18/16.   Pertinent History 19 stairs at home and has been able to complete since surgery.     Limitations Lifting;Standing;Sitting;Walking;House hold activities;Other (comment)   Patient Stated Goals Increase L knee AROM/ strength to improve mobility/ return to work.     Currently in Pain? Yes   Pain Score 2    Pain Location Knee   Pain Orientation Left      OBJECTIVE:   Manual tx. Supine L patella mobs.(all planes), grade III, 2 bouts in each direction; Seated MET L knee flexion stretch 5 seconds contract, 5 second hold x 3 for multiple bouts; L knee flexion AROM (-2 to 121 deg.).  Supine L hamstring/ gastroc stretches 3x each with 20 sec. Static holds.   There.ex Nustep L6 10 minutes. Seat #9-8(warm-up/ no charge).  Forward step-up to 6" step leading without UE  support with LLE for ascending and RLE for backwards descending, cues to slow descent to improve eccentric L quad control x 10; Left lateral step-up to 6" step without UE support x 10; Backwards step-up to 6" step, leading with LLE during ascending and RLE during descending, pt requires unilateral UE support x 10; BOSU forward lunges leading with LLE 2  x 10; BOSU L step ups with no UE assist (cuing for posture/mirror feedback) 2 x 10;  Pt response for medical necessity: Pt. Benefits from skilled PT services to increase L knee ROM/ stability to promote return to work/ independent functional mobility.        PT Long Term Goals - 08/12/16 1300      PT LONG TERM GOAL #1   Title Pt. independent with HEP to increase L knee AROM (0 to 115 deg.) to improve pain-free mobility.     Baseline -2 to 121 deg.   Time 6   Period Weeks   Status Achieved     PT LONG TERM GOAL #2   Title Pt. will increase LEFS to >60 out of 80 to improve pain-free mobility/ return to work.     Baseline LEFS: 40 out of 80 on 08/11/16   Time 6   Period Weeks   Status Not Met     PT LONG TERM GOAL #3   Title  Pt. able to ambulate community distance with normalized gait pattern on L with no assistive device safely to improve daily mobility.    Baseline No assistive device.  More normalized with occasional L antalgic gait pattern with increase distance walked.     Time 6   Period Weeks   Status Partially Met     PT LONG TERM GOAL #4   Title Pt. able to ascend/descend 19 stairs at home with recip. pattern and no UE assist safety to improve ability to get to 2nd floor bedroom.     Time 6   Period Weeks   Status On-going             Plan - 08/12/16 1257    Clinical Impression Statement Pt. continues to impress with marked improvement in more normalized gait pattern without use of SPC.  Pt. demonstrates consistent L knee flexion of >118 deg. AROM with no increase c/o pain.  Good patellar mobility.  Pt. has 1  more PT visit prior to scheduled return to work.  LEFS: 40 out of 80 (no significant changes since initial evaluation).     Rehab Potential Good   PT Frequency 2x / week   PT Duration 6 weeks   PT Treatment/Interventions ADLs/Self Care Home Management;Aquatic Therapy;Cryotherapy;Electrical Stimulation;Gait training;Stair training;Functional mobility training;Therapeutic activities;Therapeutic exercise;Patient/family education;Neuromuscular re-education;Balance training;Manual techniques;Passive range of motion;Scar mobilization   PT Next Visit Plan Increase L LE ROM/ stability to promote return to pain-free functional mobility.     PT Home Exercise Plan See handouts, as prescribed   Consulted and Agree with Plan of Care Patient      Patient will benefit from skilled therapeutic intervention in order to improve the following deficits and impairments:  Abnormal gait, Improper body mechanics, Pain, Postural dysfunction, Decreased mobility, Decreased activity tolerance, Decreased endurance, Decreased range of motion, Decreased strength, Hypomobility, Impaired flexibility, Difficulty walking, Decreased safety awareness  Visit Diagnosis: Joint stiffness of knee, left  Muscle weakness (generalized)  Chronic pain of left knee  Difficulty in walking, not elsewhere classified     Problem List Patient Active Problem List   Diagnosis Date Noted  . Elevated blood sugar 09/05/2015  . OSA (obstructive sleep apnea) 08/06/2015  . Exercise-induced asthma 04/29/2015  . GERD without esophagitis 04/29/2015  . Hx of abnormal cervical Papanicolaou smear 04/29/2015  . Degenerative arthritis of knee, bilateral 03/08/2015   Pura Spice, PT, DPT # [id number] 08/12/2016, 1:03 PM  [place] [facility] [address] [place], [place], [address] Phone: [phone]   Fax:  [phone]  Name: Kaitlyn Mcdaniel MRN: [id number] Date of Birth: 04-Jul-1971

## 2016-08-13 ENCOUNTER — Ambulatory Visit: Payer: 59 | Admitting: Physical Therapy

## 2016-08-13 DIAGNOSIS — G8929 Other chronic pain: Secondary | ICD-10-CM

## 2016-08-13 DIAGNOSIS — M25662 Stiffness of left knee, not elsewhere classified: Secondary | ICD-10-CM | POA: Diagnosis not present

## 2016-08-13 DIAGNOSIS — M6281 Muscle weakness (generalized): Secondary | ICD-10-CM

## 2016-08-13 DIAGNOSIS — R262 Difficulty in walking, not elsewhere classified: Secondary | ICD-10-CM

## 2016-08-13 DIAGNOSIS — M25562 Pain in left knee: Secondary | ICD-10-CM

## 2016-08-14 NOTE — Therapy (Addendum)
[facility] [address] [place], [place], [address] Phone: [phone]   Fax:  [phone]  Physical Therapy Treatment  Patient Details  Name: Kaitlyn Mcdaniel MRN: [id number] Date of Birth: 17-Dec-1970 Referring Provider: Dr. Jones Bales.   Encounter Date: 08/13/2016      PT End of Session - 08/14/16 1812    Visit Number 6   Number of Visits 12   Date for PT Re-Evaluation 09/08/16   PT Start Time 1114   PT Stop Time 1201   PT Time Calculation (min) 47 min   Activity Tolerance Patient tolerated treatment well   Behavior During Therapy [facility] for tasks assessed/performed      No past medical history on file.  Past Surgical History:  Procedure Laterality Date  . BREAST REDUCTION SURGERY  1999  . CERVIX LESION DESTRUCTION  1998   abnormal PAP  . REDUCTION MAMMAPLASTY Bilateral 1999    There were no vitals filed for this visit.      Subjective Assessment - 08/13/16 1125    Patient is accompained by: (P)  Family member   Pertinent History (P)  19 stairs at home and has been able to complete since surgery.     Limitations (P)  Lifting;Standing;Sitting;Walking;House hold activities;Other (comment)      Pt. reports she is doing well and c/o minimal L knee pain (1-2/10).  Pt. returns to work next Monday and instructed to contact PT if any issues next week and will continue with HEP.      OBJECTIVE:   Manual tx. Supine L patella mobs.(all planes), grade III, 2 bouts in each direction; Seated MET L knee flexion stretch 5 seconds contract, 5 second hold x 3 for multiple bouts; L knee flexion AROM (-2 to 120 deg.).  Supine L hamstring/ gastroc stretches 3x each with 20 sec. Static holds.   There.ex Nustep L6 41mnutes. Seat #9-8(warm-up/ no charge).  Forward step-up to 6" step leading without UE support with LLE for ascendingand RLE for backwards descending, cues to slow descent to improve eccentric L quad  control x 10; Left lateral step-up to 6" step without UE support x 10; Backwards step-up to 6" step, leading with LLE during ascending and RLE during descending, pt requires unilateral UE support x 10;  TG knee flexion 20x/ single leg 10x each/ heel raises.  Floor to waist lifting (proper technique) and B carrying 10# 30 feet. BOSU forward lunges leading with LLE 2 x 10; BOSU L step ups with no UE assist (cuing for posture/mirror feedback) 2 x 10;  Pt response for medical necessity: Pt. Benefits from skilled PT services to increase L knee ROM/ stability to promote return to work/ independent functional mobility.     Pt. has continued to progress towards all PT goals.  Pt. will be able to return to work without limitations next week.  L knee flexion >120 deg. in supine position.  Pt. will benefit from prehab for next several weeks to maximize L knee/LE strength prior to R TKA.          PT Long Term Goals - 08/12/16 1300      PT LONG TERM GOAL #1   Title Pt. independent with HEP to increase L knee AROM (0 to 115 deg.) to improve pain-free mobility.     Baseline -2 to 121 deg.   Time 6   Period Weeks   Status Achieved     PT LONG TERM GOAL #2  Title Pt. will increase LEFS to >60 out of 80 to improve pain-free mobility/ return to work.     Baseline LEFS: 40 out of 80 on 08/11/16   Time 6   Period Weeks   Status Not Met     PT LONG TERM GOAL #3   Title Pt. able to ambulate community distance with normalized gait pattern on L with no assistive device safely to improve daily mobility.    Baseline No assistive device.  More normalized with occasional L antalgic gait pattern with increase distance walked.     Time 6   Period Weeks   Status Partially Met     PT LONG TERM GOAL #4   Title Pt. able to ascend/descend 19 stairs at home with recip. pattern and no UE assist safety to improve ability to get to 2nd floor bedroom.     Time 6   Period Weeks   Status On-going                Plan - 08/14/16 1813    Rehab Potential Good   PT Frequency 2x / week   PT Duration 6 weeks   PT Treatment/Interventions ADLs/Self Care Home Management;Aquatic Therapy;Cryotherapy;Electrical Stimulation;Gait training;Stair training;Functional mobility training;Therapeutic activities;Therapeutic exercise;Patient/family education;Neuromuscular re-education;Balance training;Manual techniques;Passive range of motion;Scar mobilization   PT Next Visit Plan Pt. will contact PT after returning to work next week to check status.  Pt. will benefit from continued PT to maximize L knee strength prior to R TKA in 1 month.        Patient will benefit from skilled therapeutic intervention in order to improve the following deficits and impairments:  Abnormal gait, Improper body mechanics, Pain, Postural dysfunction, Decreased mobility, Decreased activity tolerance, Decreased endurance, Decreased range of motion, Decreased strength, Hypomobility, Impaired flexibility, Difficulty walking, Decreased safety awareness  Visit Diagnosis: Joint stiffness of knee, left  Muscle weakness (generalized)  Chronic pain of left knee  Difficulty in walking, not elsewhere classified     Problem List Patient Active Problem List   Diagnosis Date Noted  . Elevated blood sugar 09/05/2015  . OSA (obstructive sleep apnea) 08/06/2015  . Exercise-induced asthma 04/29/2015  . GERD without esophagitis 04/29/2015  . Hx of abnormal cervical Papanicolaou smear 04/29/2015  . Degenerative arthritis of knee, bilateral 03/08/2015   Pura Spice, PT, DPT # [id number] 08/14/2016, 6:15 PM  [place] [facility] [address] [place], [place], [address] Phone: [phone]   Fax:  [phone]  Name: VALERIE CONES MRN: [id number] Date of Birth: 1971/03/09

## 2016-08-18 ENCOUNTER — Ambulatory Visit: Payer: 59 | Admitting: Physical Therapy

## 2016-08-20 ENCOUNTER — Encounter: Payer: 59 | Admitting: Physical Therapy

## 2016-08-25 ENCOUNTER — Encounter: Payer: 59 | Admitting: Physical Therapy

## 2016-08-27 ENCOUNTER — Ambulatory Visit: Payer: 59 | Attending: Unknown Physician Specialty | Admitting: Physical Therapy

## 2016-08-27 ENCOUNTER — Encounter: Payer: Self-pay | Admitting: Physical Therapy

## 2016-08-27 ENCOUNTER — Encounter: Payer: 59 | Admitting: Physical Therapy

## 2016-08-27 DIAGNOSIS — M25562 Pain in left knee: Secondary | ICD-10-CM | POA: Diagnosis not present

## 2016-08-27 DIAGNOSIS — G8929 Other chronic pain: Secondary | ICD-10-CM | POA: Diagnosis not present

## 2016-08-27 DIAGNOSIS — M6281 Muscle weakness (generalized): Secondary | ICD-10-CM | POA: Diagnosis not present

## 2016-08-27 DIAGNOSIS — M25662 Stiffness of left knee, not elsewhere classified: Secondary | ICD-10-CM

## 2016-08-27 DIAGNOSIS — R262 Difficulty in walking, not elsewhere classified: Secondary | ICD-10-CM

## 2016-08-28 NOTE — Therapy (Addendum)
[facility] [address] [place], [place], [address] Phone: [phone]   Fax:  [phone]  Physical Therapy Treatment  Patient Details  Name: Kaitlyn Mcdaniel MRN: [id number] Date of Birth: 15-Feb-1971 Referring Provider: Dr. Jones Bales.   Encounter Date: 08/27/2016      PT End of Session - 08/28/16 1222    Visit Number 7   Number of Visits 12   Date for PT Re-Evaluation 09/08/16   PT Start Time 8466   PT Stop Time 1700   PT Time Calculation (min) 53 min   Activity Tolerance Patient tolerated treatment well   Behavior During Therapy [facility] for tasks assessed/performed      History reviewed. No pertinent past medical history.  Past Surgical History:  Procedure Laterality Date  . BREAST REDUCTION SURGERY  1999  . CERVIX LESION DESTRUCTION  1998   abnormal PAP  . REDUCTION MAMMAPLASTY Bilateral 1999    There were no vitals filed for this visit.      Subjective Assessment - 08/28/16 1222    Patient is accompained by: Family member   Pertinent History 19 stairs at home and has been able to complete since surgery.     Limitations Lifting;Standing;Sitting;Walking;House hold activities;Other (comment)   Patient Stated Goals Increase L knee AROM/ strength to improve mobility/ return to work.     Currently in Pain? Yes   Pain Score 1    Pain Location Knee   Pain Orientation Left      Pt. states her return to work has gone well.  Pt. reports minimal L knee pain after work today.  Pt. scheduled to have R TKA on 12/28.   There.ex.: Nustep L6 10 min.  TG knee flexion 30x/ heel raises 20x.  Supine knee flexion with static holds/ bridging 20x.  3" plinth gastroc stretches/ eccentric step downs.  Airex step ups/ downs with progression to eccentric L knee step downs. 10x.  Tandem gait/ SLS.  Reviewed HEP in depth.  Discussed up coming R TKA.      Discharge from PT at this time with focus on HEP until R TKA in a couple of  weeks.  L knee AROM: -2 to 121 deg.          PT Long Term Goals - 08/28/16 1223      PT LONG TERM GOAL #1   Title Pt. independent with HEP to increase L knee AROM (0 to 115 deg.) to improve pain-free mobility.     Baseline -2 to 121 deg.   Time 6   Period Weeks   Status Achieved     PT LONG TERM GOAL #2   Title Pt. will increase LEFS to >60 out of 80 to improve pain-free mobility/ return to work.     Baseline LEFS: 40 out of 80 on 08/11/16   (R knee isssues).     Time 6   Period Weeks   Status Not Met     PT LONG TERM GOAL #3   Title Pt. able to ambulate community distance with normalized gait pattern on L with no assistive device safely to improve daily mobility.    Baseline No assistive device.  More normalized with occasional R antalgic gait pattern with increase distance walked.     Period Weeks   Status Achieved     PT LONG TERM GOAL #4   Title Pt. able to ascend/descend 19 stairs at home with recip. pattern and no UE  assist safety to improve ability to get to 2nd floor bedroom.     Baseline recip pattern   Time 6   Period Weeks   Status Achieved     PT LONG TERM GOAL #5   Title Pt. able to return to work as M[employer] of [employer] with no restrictions by 08/18/16.     Time 3   Period Weeks   Status Achieved               Plan - 08/28/16 1222    Rehab Potential Good   PT Frequency 2x / week   PT Duration 6 weeks   PT Treatment/Interventions ADLs/Self Care Home Management;Aquatic Therapy;Cryotherapy;Electrical Stimulation;Gait training;Stair training;Functional mobility training;Therapeutic activities;Therapeutic exercise;Patient/family education;Neuromuscular re-education;Balance training;Manual techniques;Passive range of motion;Scar mobilization   PT Next Visit Plan Pt. discharged from PT at this time and will return after R TKA.    PT Home Exercise Plan See handouts, as prescribed   Consulted and Agree with Plan of Care Patient      Patient will benefit  from skilled therapeutic intervention in order to improve the following deficits and impairments:  Abnormal gait, Improper body mechanics, Pain, Postural dysfunction, Decreased mobility, Decreased activity tolerance, Decreased endurance, Decreased range of motion, Decreased strength, Hypomobility, Impaired flexibility, Difficulty walking, Decreased safety awareness  Visit Diagnosis: Joint stiffness of knee, left  Muscle weakness (generalized)  Chronic pain of left knee  Difficulty in walking, not elsewhere classified     Problem List Patient Active Problem List   Diagnosis Date Noted  . Elevated blood sugar 09/05/2015  . OSA (obstructive sleep apnea) 08/06/2015  . Exercise-induced asthma 04/29/2015  . GERD without esophagitis 04/29/2015  . Hx of abnormal cervical Papanicolaou smear 04/29/2015  . Degenerative arthritis of knee, bilateral 03/08/2015   Pura Spice, PT, DPT # [id number] 08/28/2016, 12:25 PM  [facility] [address] [place], [place], [address] Phone: [phone]   Fax:  [phone]  Name: Kaitlyn Mcdaniel MRN: [id number] Date of Birth: 08/23/1971

## 2016-09-02 DIAGNOSIS — Z96652 Presence of left artificial knee joint: Secondary | ICD-10-CM | POA: Insufficient documentation

## 2016-09-11 DIAGNOSIS — M25561 Pain in right knee: Secondary | ICD-10-CM | POA: Diagnosis not present

## 2016-09-11 DIAGNOSIS — I10 Essential (primary) hypertension: Secondary | ICD-10-CM | POA: Diagnosis not present

## 2016-09-11 DIAGNOSIS — G4730 Sleep apnea, unspecified: Secondary | ICD-10-CM | POA: Diagnosis not present

## 2016-09-11 DIAGNOSIS — G4733 Obstructive sleep apnea (adult) (pediatric): Secondary | ICD-10-CM | POA: Diagnosis not present

## 2016-09-11 DIAGNOSIS — M1711 Unilateral primary osteoarthritis, right knee: Secondary | ICD-10-CM | POA: Diagnosis not present

## 2016-09-11 DIAGNOSIS — G8918 Other acute postprocedural pain: Secondary | ICD-10-CM | POA: Diagnosis not present

## 2016-09-11 DIAGNOSIS — M25869 Other specified joint disorders, unspecified knee: Secondary | ICD-10-CM | POA: Diagnosis not present

## 2016-09-11 DIAGNOSIS — G4700 Insomnia, unspecified: Secondary | ICD-10-CM | POA: Diagnosis not present

## 2016-09-11 DIAGNOSIS — K219 Gastro-esophageal reflux disease without esophagitis: Secondary | ICD-10-CM | POA: Diagnosis not present

## 2016-09-11 DIAGNOSIS — Z6841 Body Mass Index (BMI) 40.0 and over, adult: Secondary | ICD-10-CM | POA: Diagnosis not present

## 2016-09-11 DIAGNOSIS — J45909 Unspecified asthma, uncomplicated: Secondary | ICD-10-CM | POA: Diagnosis not present

## 2016-09-11 DIAGNOSIS — Z832 Family history of diseases of the blood and blood-forming organs and certain disorders involving the immune mechanism: Secondary | ICD-10-CM | POA: Diagnosis not present

## 2016-09-15 HISTORY — PX: TOTAL KNEE ARTHROPLASTY: SHX125

## 2016-09-16 ENCOUNTER — Ambulatory Visit: Payer: 59 | Attending: Unknown Physician Specialty | Admitting: Physical Therapy

## 2016-09-16 ENCOUNTER — Ambulatory Visit: Payer: 59 | Admitting: Physical Therapy

## 2016-09-16 DIAGNOSIS — R262 Difficulty in walking, not elsewhere classified: Secondary | ICD-10-CM | POA: Diagnosis not present

## 2016-09-16 DIAGNOSIS — M6281 Muscle weakness (generalized): Secondary | ICD-10-CM | POA: Insufficient documentation

## 2016-09-16 DIAGNOSIS — M25661 Stiffness of right knee, not elsewhere classified: Secondary | ICD-10-CM | POA: Insufficient documentation

## 2016-09-16 DIAGNOSIS — M25561 Pain in right knee: Secondary | ICD-10-CM | POA: Diagnosis not present

## 2016-09-17 ENCOUNTER — Encounter: Payer: Self-pay | Admitting: Physical Therapy

## 2016-09-17 NOTE — Therapy (Addendum)
[facility] [facility] [address] [place], [place], [address] Phone: [phone]   Fax:  [phone]  Physical Therapy Evaluation  Patient Details  Name: Kaitlyn Mcdaniel MRN: [id number] Date of Birth: 10-Feb-1971 Referring Provider: Dr. Jones Bales.   Encounter Date: 09/16/2016  Visit 1 of 12       PT End of Session - 09/18/16 1304    Visit Number 1   Number of Visits 12   Date for PT Re-Evaluation 10/14/16   PT Start Time 1258   Activity Tolerance Patient tolerated treatment well;Patient limited by pain   Behavior During Therapy [facility] for tasks assessed/performed      History reviewed. No pertinent past medical history.  Past Surgical History:  Procedure Laterality Date  . BREAST REDUCTION SURGERY  1999  . CERVIX LESION DESTRUCTION  1998   abnormal PAP  . REDUCTION MAMMAPLASTY Bilateral 1999    There were no vitals filed for this visit.       Subjective Assessment - 09/18/16 1303    Subjective Pt. entered PT clinic with use of RW and no new complaints.  Pt. reports 5-6/10 R knee pain with Nustep knee flexion stretches.  More swelling and stiffness, than pain.  Pt. RTW is 10/20/16.     Patient is accompained by: Family member   Pertinent History 19 stairs at home and has been able to complete since surgery.     Limitations Lifting;Standing;Sitting;Walking;House hold activities;Other (comment)   Patient Stated Goals Increase R knee AROM/ strength to improve mobility/ return to work.     Currently in Pain? Yes   Pain Score 6    Pain Location Knee   Pain Orientation Right     Pt. s/p R TKA on 09/11/16.  Pt. states recovery from surgery went better this time around due to L TKA experience.  Pt. reports 6-7/19 R knee joint pain.     Supine R knee AA/PROM: flexion/ ext. To pain tolerable range with static holds.  Patellar mobs. (difficulty due to dressing).    Pt. is a pleasant 46 y/o female s/p R TKA (Makoplasty) on  09/11/16.  Pt. reports 6-7/10 R knee pain currently at rest/best and >10/10 at worst.  Pt. very acute and dressing/ staples in place.  L knee AROM (0 to 124 deg.)- s/p L TKA recently (pt. known well to PT).  R knee AROM (-7 to 47 deg.)- significant c/o pain.  Moderate swelling in R LE:  Circumferential measurements (L/R): joint line (52/56 cm), distal quad. (64/66 cm.), mid-gastroc. (48/50.5 cm.).  Pt. ambulates with mod. R antalgic gait pattern with use of RW and limited R hip/knee flexion.  Pt. has to focus on R hip/knee flexion and heel strike with each step.  LEFS: 4 out of 80.  Pt. will benefit from skilled PT services to increase R knee ROM/ stability to improve normalized gait and return to work.        PT Long Term Goals - 09/19/16 0841      PT LONG TERM GOAL #1   Title Pt. independent with HEP to increase R knee AROM (0 to 115 deg.) to improve pain-free mobility.     Baseline R knee (-7 to 47 deg.) AROM   Time 4   Period Weeks   Status New     PT LONG TERM GOAL #2   Title Pt. will increase LEFS to >40 out of 80 to improve pain-free mobility/ return to work.  Baseline LEFS: 4 out of 80 on 09/16/16    Time 4   Period Weeks   Status New     PT LONG TERM GOAL #3   Title Pt. able to ambulate community distance with normalized gait pattern with least assistive device safely to improve daily mobility.    Baseline pt. using RW and limited R hip/knee flexion.     Time 4   Period Weeks   Status New     PT LONG TERM GOAL #4   Title Pt. able to ascend/descend 19 stairs at home with recip. pattern and no UE assist safety to improve ability to get to 2nd floor bedroom.     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     Baseline currently out of work   Time 4   Period Weeks   Status New               Plan - 09/18/16 1304    Rehab Potential  Good   PT Frequency 3x / week   PT Duration 4 weeks   PT Treatment/Interventions ADLs/Self Care Home Management;Aquatic Therapy;Cryotherapy;Electrical Stimulation;Gait training;Stair training;Functional mobility training;Therapeutic activities;Therapeutic exercise;Patient/family education;Neuromuscular re-education;Balance training;Manual techniques;Passive range of motion;Scar mobilization   PT Next Visit Plan Progress R knee flexion/ext. ROM as tolerated.     PT Home Exercise Plan See handouts, as prescribed   Consulted and Agree with Plan of Care Patient      Patient will benefit from skilled therapeutic intervention in order to improve the following deficits and impairments:  Abnormal gait, Improper body mechanics, Pain, Postural dysfunction, Decreased mobility, Decreased activity tolerance, Decreased endurance, Decreased range of motion, Decreased strength, Hypomobility, Impaired flexibility, Difficulty walking, Decreased safety awareness  Visit Diagnosis: Acute pain of right knee  Joint stiffness of knee, right  Difficulty in walking, not elsewhere classified  Muscle weakness (generalized)     Problem List Patient Active Problem List   Diagnosis Date Noted  . Elevated blood sugar 09/05/2015  . OSA (obstructive sleep apnea) 08/06/2015  . Exercise-induced asthma 04/29/2015  . GERD without esophagitis 04/29/2015  . Hx of abnormal cervical Papanicolaou smear 04/29/2015  . Degenerative arthritis of knee, bilateral 03/08/2015   Pura Spice, PT, DPT # [id number] 09/19/2016, 8:51 AM  [place] [facility] [address] [place], [place], [address] Phone: [phone]   Fax:  [phone]  Name: Kaitlyn Mcdaniel MRN: [id number] Date of Birth: [birth year]

## 2016-09-18 ENCOUNTER — Ambulatory Visit: Payer: 59 | Admitting: Physical Therapy

## 2016-09-18 ENCOUNTER — Encounter: Payer: Self-pay | Admitting: Physical Therapy

## 2016-09-18 DIAGNOSIS — M25661 Stiffness of right knee, not elsewhere classified: Secondary | ICD-10-CM | POA: Diagnosis not present

## 2016-09-18 DIAGNOSIS — M25561 Pain in right knee: Secondary | ICD-10-CM

## 2016-09-18 DIAGNOSIS — M6281 Muscle weakness (generalized): Secondary | ICD-10-CM

## 2016-09-18 DIAGNOSIS — R262 Difficulty in walking, not elsewhere classified: Secondary | ICD-10-CM | POA: Diagnosis not present

## 2016-09-18 NOTE — Therapy (Addendum)
[facility] [address] [place], [place], [address] Phone: [phone]   Fax:  [phone]  Physical Therapy Treatment  Patient Details  Name: Kaitlyn Mcdaniel MRN: [id number] Date of Birth: 16-Mar-1971 Referring Provider: Dr. Jones Bales.   Encounter Date: 09/18/2016  2 of 12 treatment sessions  History reviewed. No pertinent past medical history.  Past Surgical History:  Procedure Laterality Date  . BREAST REDUCTION SURGERY  1999  . CERVIX LESION DESTRUCTION  1998   abnormal PAP  . REDUCTION MAMMAPLASTY Bilateral 1999    There were no vitals filed for this visit.    Pt. entered PT clinic with use of RW and no new complaints.  Pt. reports 5-6/10 R knee pain with Nustep knee flexion stretches.  More swelling and stiffness, than pain.  Pt. RTW is 10/20/16.     Next MD f/u is 1/12 for staple removal.     OBJECTIVE:  There.ex.: Nustep L4 10 min. With R knee flexion focus/ static holds.  Standing/ walking hip flexion in //-bars with focus on R hip/knee flexion forward and backwards/sideways (3x each).  Seated hip flexion/ heel raises/ LAQ 10x each (increase nausea during LAQ).  Walking in clinic with RW and working on recip. Gait pattern/ upright posture (raised RW 1 notch).  Supine SAQ 10x with min. AAROM. Manual tx.:  Seated/supine R hip/knee AA/PROM 10x with static holds as tolerated.  STM to R distal quad/ gastroc muscles (no tenderness).  Patellar mobs. (all planes)- gentle.        Pt response for medical necessity: benefits from progression in R knee ROM/ strengthening to improve mobility/ return to work.  Pain limited with all aspects of knee flexion/ ext.  Good motivation.      Pt. remains very pain limited but marked increase in R knee AA/PROM in supine position to 66 deg.  Pt. becomes labile during gentle manual treatment session with focus on knee flexion/ ext./ patellar mobility.  Moderate swelling and tenderness in R  medial aspect of knee during extension/ hamstring stretches.  Good patellar mobility (all planes) even with swelling/ staples in place.       PT Long Term Goals - 09/19/16 0841      PT LONG TERM GOAL #1   Title Pt. independent with HEP to increase R knee AROM (0 to 115 deg.) to improve pain-free mobility.     Baseline R knee (-7 to 47 deg.) AROM   Time 4   Period Weeks   Status New     PT LONG TERM GOAL #2   Title Pt. will increase LEFS to >40 out of 80 to improve pain-free mobility/ return to work.     Baseline LEFS: 4 out of 80 on 09/16/16    Time 4   Period Weeks   Status New     PT LONG TERM GOAL #3   Title Pt. able to ambulate community distance with normalized gait pattern with least assistive device safely to improve daily mobility.    Baseline pt. using RW and limited R hip/knee flexion.     Time 4   Period Weeks   Status New     PT LONG TERM GOAL #4   Title Pt. able to ascend/descend 19 stairs at home with recip. pattern and no UE assist safety to improve ability to get to 2nd floor bedroom.     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     Baseline currently out of work   Time 4   Period Weeks   Status New       Patient will benefit from skilled therapeutic intervention in order to improve the following deficits and impairments:  Abnormal gait, Improper body mechanics, Pain, Postural dysfunction, Decreased mobility, Decreased activity tolerance, Decreased endurance, Decreased range of motion, Decreased strength, Hypomobility, Impaired flexibility, Difficulty walking, Decreased safety awareness  Visit Diagnosis: Acute pain of right knee  Joint stiffness of knee, right  Difficulty in walking, not elsewhere classified  Muscle weakness (generalized)     Problem List Patient Active Problem List   Diagnosis Date Noted  .  Elevated blood sugar 09/05/2015  . OSA (obstructive sleep apnea) 08/06/2015  . Exercise-induced asthma 04/29/2015  . GERD without esophagitis 04/29/2015  . Hx of abnormal cervical Papanicolaou smear 04/29/2015  . Degenerative arthritis of knee, bilateral 03/08/2015   Pura Spice, PT, DPT # [id number] 09/23/2016, 5:47 PM  [facility] [address] [place], [place], [address] Phone: [phone]   Fax:  [phone]  Name: Kaitlyn Mcdaniel MRN: [id number] Date of Birth: 1971-02-13

## 2016-09-19 ENCOUNTER — Ambulatory Visit: Payer: 59 | Admitting: Physical Therapy

## 2016-09-19 DIAGNOSIS — M25561 Pain in right knee: Secondary | ICD-10-CM | POA: Diagnosis not present

## 2016-09-19 DIAGNOSIS — M6281 Muscle weakness (generalized): Secondary | ICD-10-CM | POA: Diagnosis not present

## 2016-09-19 DIAGNOSIS — M25661 Stiffness of right knee, not elsewhere classified: Secondary | ICD-10-CM | POA: Diagnosis not present

## 2016-09-19 DIAGNOSIS — R262 Difficulty in walking, not elsewhere classified: Secondary | ICD-10-CM | POA: Diagnosis not present

## 2016-09-19 NOTE — Therapy (Addendum)
[facility] [address] [place], [place], [address] Phone: [phone]   Fax:  [phone]  Physical Therapy Treatment  Patient Details  Name: Kaitlyn Mcdaniel MRN: [id number] Date of Birth: 28-Mar-1971 Referring Provider: Dr. Jones Bales.   Encounter Date: 09/19/2016      PT End of Session - 09/19/16 1630    Visit Number 3   Number of Visits 12   Date for PT Re-Evaluation 10/14/16   PT Start Time 1116   PT Stop Time 1210   PT Time Calculation (min) 54 min   Activity Tolerance Patient tolerated treatment well;Patient limited by pain   Behavior During Therapy [facility] for tasks assessed/performed      No past medical history on file.  Past Surgical History:  Procedure Laterality Date  . BREAST REDUCTION SURGERY  1999  . CERVIX LESION DESTRUCTION  1998   abnormal PAP  . REDUCTION MAMMAPLASTY Bilateral 1999    There were no vitals filed for this visit.      Subjective Assessment - 09/19/16 1630    Patient is accompained by: Family member   Limitations Lifting;Standing;Sitting;Walking;House hold activities;Other (comment)   Patient Stated Goals Increase R knee AROM/ strength to improve mobility/ return to work.     Currently in Pain? Yes   Pain Score 5        Pt. reports having significant cramping in R and L LE since last PT tx. session.  Pt. states she did not sleep at all last night.     OBJECTIVE:  There.ex.: Nustep L5 10 min. At seat #10-9 With R knee flexion focus/ static holds (warm-up/no charge).  Standing hip ex. (flexion/ abd./ extension/ knee flexion/ partial squats with chair/ heel raises)- 20x each. Walking hip flexion in //-bars with focus on R hip/knee flexion forward and backwards/sideways (3x each).  Seated hip flexion/ heel raises/ LAQ 10x each.  Gait pattern: Walking in clinic with RW and working on recip. Gait pattern/ upright posture.  Amb. In //-bars with L UE assist and 2-point gait pattern.  Pt.  Not ready to progress to [facility] at this time but progressing well.  Manual tx.: Seated/supine R hip/knee AA/PROM 10x with static holds as tolerated.  STM to R distal quad/ gastroc muscles (no tenderness).  Patellar mobs. (all planes)- gentle.                   Pt response for medical necessity: benefits from progression in R knee ROM/ strengthening to improve mobility/ return to work.  Pain limited with all aspects of knee flexion/ ext.  Good motivation.      Pt. experienced no muscle cramping during PT tx. session but remains pain limited/ guarded during supine R knee flexion with static holds.  Pt. able to progress R knee knee flexion to 72 deg.  Pt. became labile during tx. session due to pain/ fear of pain with R hip/knee ther.ex and stretches in supine.  Good technique with standing hip/LE ex. program in //-bars.  Progressing to single UE assist on L during recip. step pattern.         PT Long Term Goals - 09/19/16 0841      PT LONG TERM GOAL #1   Title Pt. independent with HEP to increase R knee AROM (0 to 115 deg.) to improve pain-free mobility.     Baseline R knee (-7 to 47 deg.) AROM   Time 4   Period Weeks  Status New     PT LONG TERM GOAL #2   Title Pt. will increase LEFS to >40 out of 80 to improve pain-free mobility/ return to work.     Baseline LEFS: 4 out of 80 on 09/16/16    Time 4   Period Weeks   Status New     PT LONG TERM GOAL #3   Title Pt. able to ambulate community distance with normalized gait pattern with least assistive device safely to improve daily mobility.    Baseline pt. using RW and limited R hip/knee flexion.     Time 4   Period Weeks   Status New     PT LONG TERM GOAL #4   Title Pt. able to ascend/descend 19 stairs at home with recip. pattern and no UE assist safety to improve ability to get to 2nd floor bedroom.     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     Baseline currently out of work   Time 4   Period Weeks   Status New            Plan - 09/19/16 1631    Rehab Potential Good   PT Frequency 3x / week   PT Duration 4 weeks   PT Treatment/Interventions ADLs/Self Care Home Management;Aquatic Therapy;Cryotherapy;Electrical Stimulation;Gait training;Stair training;Functional mobility training;Therapeutic activities;Therapeutic exercise;Patient/family education;Neuromuscular re-education;Balance training;Manual techniques;Passive range of motion;Scar mobilization   PT Next Visit Plan Progress R knee flexion/ext. ROM as tolerated.     PT Home Exercise Plan See handouts, as prescribed   Consulted and Agree with Plan of Care Patient      Patient will benefit from skilled therapeutic intervention in order to improve the following deficits and impairments:  Abnormal gait, Improper body mechanics, Pain, Postural dysfunction, Decreased mobility, Decreased activity tolerance, Decreased endurance, Decreased range of motion, Decreased strength, Hypomobility, Impaired flexibility, Difficulty walking, Decreased safety awareness  Visit Diagnosis: Acute pain of right knee  Joint stiffness of knee, right  Difficulty in walking, not elsewhere classified  Muscle weakness (generalized)     Problem List Patient Active Problem List   Diagnosis Date Noted  . Elevated blood sugar 09/05/2015  . OSA (obstructive sleep apnea) 08/06/2015  . Exercise-induced asthma 04/29/2015  . GERD without esophagitis 04/29/2015  . Hx of abnormal cervical Papanicolaou smear 04/29/2015  . Degenerative arthritis of knee, bilateral 03/08/2015   Pura Spice, PT, DPT # [id number] 09/19/2016, 4:32 PM  [place] [address] [place], [place], [address] Phone: [phone]   Fax:  [phone]  Name: Kaitlyn Mcdaniel MRN: [id number] Date of Birth: [birth year]

## 2016-09-19 NOTE — Addendum Note (Signed)
Addended by: Pura Spice on: 09/19/2016 08:58 AM   Modules accepted: Orders

## 2016-09-22 ENCOUNTER — Ambulatory Visit: Payer: 59 | Admitting: Physical Therapy

## 2016-09-22 DIAGNOSIS — R262 Difficulty in walking, not elsewhere classified: Secondary | ICD-10-CM

## 2016-09-22 DIAGNOSIS — M6281 Muscle weakness (generalized): Secondary | ICD-10-CM

## 2016-09-22 DIAGNOSIS — M25661 Stiffness of right knee, not elsewhere classified: Secondary | ICD-10-CM | POA: Diagnosis not present

## 2016-09-22 DIAGNOSIS — M25561 Pain in right knee: Secondary | ICD-10-CM

## 2016-09-23 NOTE — Therapy (Addendum)
[facility] [address] [place], [place], [address] Phone: [phone]   Fax:  [phone]  Physical Therapy Treatment  Patient Details  Name: Kaitlyn Mcdaniel MRN: [id number] Date of Birth: 1970/11/29 Referring Provider: Dr. Jones Bales.   Encounter Date: 09/22/2016      PT End of Session - 09/23/16 1819    Visit Number 4   Number of Visits 12   Date for PT Re-Evaluation 10/14/16   PT Start Time 0901   PT Stop Time 1024   PT Time Calculation (min) 83 min   Activity Tolerance Patient tolerated treatment well;Patient limited by pain   Behavior During Therapy [facility] for tasks assessed/performed      No past medical history on file.  Past Surgical History:  Procedure Laterality Date  . BREAST REDUCTION SURGERY  1999  . CERVIX LESION DESTRUCTION  1998   abnormal PAP  . REDUCTION MAMMAPLASTY Bilateral 1999    There were no vitals filed for this visit.      Subjective Assessment - 09/23/16 1818    Subjective Pt. reports continued cramping in B LE (R>L).  Pt. not sleeping well due to cramping/ R knee pain.  Pt. using ice to manage inflammation.  Good scar healing and staples will be removed Friday.     Patient is accompained by: Family member   Pertinent History 19 stairs at home and has been able to complete since surgery.     Limitations Lifting;Standing;Sitting;Walking;House hold activities;Other (comment)   Patient Stated Goals Increase R knee AROM/ strength to improve mobility/ return to work.     Currently in Pain? Yes   Pain Score 5    Pain Location Knee   Pain Orientation Right   Pain Type Surgical pain     R knee flexion: 74 deg.  OBJECTIVE: There.ex: Nustep L5 seat #9 with knee flexion/ static hold focus 10 min. (warm-up/ no charge).  Supine R knee ex.: SAQ/ SLR with AAROM/ heel slides/ quad sets/ LAQ in sitting 20x.  Pt. Reports increase R lateral knee/ medial patella discomfort with flexion/ palpation.   Manual tx.: Supine R knee AA/PROM7x flexion with hold (very pain limited/ guarded today).  PT R hip/knee flexion manual PROM in supine as tolerate 5x.  STM to R distal quad/hamstring in supine position.  Patellar mobs. All planes.      Pt response for medical necessity:  Benefits from progression of R knee ROM/ stability to improve pain-free mobility and progression to independent ambulation.    No c/o cramping noted during tx. session.  Pt. remains pain limited with knee flexion but improving with R hip/knee flexion to get leg in/out of car.  Pt. is not using sheet to assist leg into car but required mod. A of B UE to place R LE/foot on Nustep safely.  Pt. working hard but has difficulty pushing self to flexion knee in supine/sitting position.         PT Long Term Goals - 09/19/16 0841      PT LONG TERM GOAL #1   Title Pt. independent with HEP to increase R knee AROM (0 to 115 deg.) to improve pain-free mobility.     Baseline R knee (-7 to 47 deg.) AROM   Time 4   Period Weeks   Status New     PT LONG TERM GOAL #2   Title Pt. will increase LEFS to >40 out of 80 to improve pain-free mobility/ return to  work.     Baseline LEFS: 4 out of 80 on 09/16/16    Time 4   Period Weeks   Status New     PT LONG TERM GOAL #3   Title Pt. able to ambulate community distance with normalized gait pattern with least assistive device safely to improve daily mobility.    Baseline pt. using RW and limited R hip/knee flexion.     Time 4   Period Weeks   Status New     PT LONG TERM GOAL #4   Title Pt. able to ascend/descend 19 stairs at home with recip. pattern and no UE assist safety to improve ability to get to 2nd floor bedroom.     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     Baseline currently out of work   Time 4   Period Weeks   Status New              Plan - 09/23/16 1820    Rehab Potential Good   PT Frequency 3x / week   PT Duration 4 weeks   PT Treatment/Interventions ADLs/Self Care Home Management;Aquatic Therapy;Cryotherapy;Electrical Stimulation;Gait training;Stair training;Functional mobility training;Therapeutic activities;Therapeutic exercise;Patient/family education;Neuromuscular re-education;Balance training;Manual techniques;Passive range of motion;Scar mobilization   PT Next Visit Plan Progress R knee flexion/ext. ROM as tolerated.     PT Home Exercise Plan See handouts, as prescribed   Consulted and Agree with Plan of Care Patient      Patient will benefit from skilled therapeutic intervention in order to improve the following deficits and impairments:  Abnormal gait, Improper body mechanics, Pain, Postural dysfunction, Decreased mobility, Decreased activity tolerance, Decreased endurance, Decreased range of motion, Decreased strength, Hypomobility, Impaired flexibility, Difficulty walking, Decreased safety awareness  Visit Diagnosis: Acute pain of right knee  Joint stiffness of knee, right  Difficulty in walking, not elsewhere classified  Muscle weakness (generalized)     Problem List Patient Active Problem List   Diagnosis Date Noted  . Elevated blood sugar 09/05/2015  . OSA (obstructive sleep apnea) 08/06/2015  . Exercise-induced asthma 04/29/2015  . GERD without esophagitis 04/29/2015  . Hx of abnormal cervical Papanicolaou smear 04/29/2015  . Degenerative arthritis of knee, bilateral 03/08/2015   Pura Spice, PT, DPT # [phone] 09/23/2016, 6:22 PM  [facility] [address] [place], [place], [address] Phone: [phone]   Fax:  [phone]  Name: Kaitlyn Mcdaniel MRN: [id number] Date of Birth: May 11, 1971

## 2016-09-24 ENCOUNTER — Ambulatory Visit: Payer: 59 | Admitting: Physical Therapy

## 2016-09-24 ENCOUNTER — Encounter: Payer: Self-pay | Admitting: Physical Therapy

## 2016-09-24 DIAGNOSIS — M25561 Pain in right knee: Secondary | ICD-10-CM

## 2016-09-24 DIAGNOSIS — R262 Difficulty in walking, not elsewhere classified: Secondary | ICD-10-CM | POA: Diagnosis not present

## 2016-09-24 DIAGNOSIS — M6281 Muscle weakness (generalized): Secondary | ICD-10-CM

## 2016-09-24 DIAGNOSIS — M25661 Stiffness of right knee, not elsewhere classified: Secondary | ICD-10-CM

## 2016-09-24 NOTE — Therapy (Signed)
[place] [facility] [address] [place], [place], [address] Phone: [phone]   Fax:  [phone]  Physical Therapy Treatment  Patient Details  Name: Kaitlyn Mcdaniel MRN: [id number] Date of Birth: 01-22-1971 Referring Provider: Dr. Jones Bales.   Encounter Date: 09/24/2016      PT End of Session - 09/24/16 1026    Visit Number 5   Number of Visits 12   Date for PT Re-Evaluation 10/14/16   PT Start Time 1021   PT Stop Time 1125   PT Time Calculation (min) 64 min   Activity Tolerance Patient tolerated treatment well;Patient limited by pain   Behavior During Therapy [facility] for tasks assessed/performed      History reviewed. No pertinent past medical history.  Past Surgical History:  Procedure Laterality Date  . BREAST REDUCTION SURGERY  1999  . CERVIX LESION DESTRUCTION  1998   abnormal PAP  . REDUCTION MAMMAPLASTY Bilateral 1999    There were no vitals filed for this visit.      Subjective Assessment - 09/24/16 1024    Subjective Pt. reports 5/10 R knee pain at rest. Pt. had a severe, sharp pain in pain in R distal quad last night while repositioning herself in bed.  Pain last for a few minutes and resolved.  No swelling or bruising noted today.      Patient is accompained by: Family member   Pertinent History 19 stairs at home and has been able to complete since surgery.     Limitations Lifting;Standing;Sitting;Walking;House hold activities;Other (comment)   Patient Stated Goals Increase R knee AROM/ strength to improve mobility/ return to work.     Currently in Pain? Yes   Pain Score 5    Pain Location Knee   Pain Orientation Right   Pain Type Surgical pain      R knee flexion: 83 deg. (pain limited).    OBJECTIVE:   There.ex: Nustep L5 seat #9-8 with knee flexion/ static hold focus 10 min. (warm-up/ no charge).  Supine R knee ex.: SAQ/ SLR with AAROM/ heel slides/ quad sets/ LAQ in sitting 20x.  Pt. Reports  increase R lateral knee/ medial patella discomfort with flexion/ palpation. Standing hip flexion/ knee flexion/ heel raises 20x each. Discuss HEP.     Manual tx.: Supine R knee AA/PROM 10x flexion with hold (very pain limited/ guarded today with increase knee flexion). R hip/knee flexion manual PROM in supine as tolerate 5x.  Seated R knee contract-relax 4x with holds focusing on increasing knee flexion.  STM to R distal quad/hamstring in supine position (tenderness/no cramping noted).   Gait training: pt. Ambulated with single-point cane and 2 point gait pattern. Pt. Still limited with R hip and knee flexion during swing through phase of gait. Cueing provided for consistent heel strike. PT instructed to use single point cane in house but RW outside.  Stair training with B UE assist: recip pattern with ascending/ and step to pattern with descending 3x 4 stairs and B handrail assist.                 Pt response for medical necessity:  Benefits from progression of R knee ROM/ stability to improve pain-free mobility and progression to independent ambulation.       PT Long Term Goals - 09/19/16 0841      PT LONG TERM GOAL #1   Title Pt. independent with HEP to increase R knee AROM (0 to 115 deg.) to  improve pain-free mobility.     Baseline R knee (-7 to 47 deg.) AROM   Time 4   Period Weeks   Status New     PT LONG TERM GOAL #2   Title Pt. will increase LEFS to >40 out of 80 to improve pain-free mobility/ return to work.     Baseline LEFS: 4 out of 80 on 09/16/16    Time 4   Period Weeks   Status New     PT LONG TERM GOAL #3   Title Pt. able to ambulate community distance with normalized gait pattern with least assistive device safely to improve daily mobility.    Baseline pt. using RW and limited R hip/knee flexion.     Time 4   Period Weeks   Status New     PT LONG TERM GOAL #4   Title Pt. able to ascend/descend 19 stairs at home with recip. pattern and no UE assist safety to improve  ability to get to 2nd floor bedroom.     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     Baseline currently out of work   Time 4   Period Weeks   Status New            Plan - 09/24/16 1121    Clinical Impression Statement Pt. able to progress R knee flexion AROM to 83 deg.  Pt. remains pain limited during supine R knee flexion with static holds but is able to actively flex her R knee into resistance with some PT assist.  Pt. demonstrates good technique with forward/backward/lateral walks in //-bars. Pt demonstrated tandem walking in //-bars and has progressed to more normalized gait pattern w/ single point cane.  Pt ascended 4 stairs with a recip. gait pattern and descended 4 stairs with a step-to gait pattern with bilateral handrails for assistance.     Rehab Potential Good   PT Frequency 3x / week   PT Duration 4 weeks   PT Treatment/Interventions ADLs/Self Care Home Management;Aquatic Therapy;Cryotherapy;Electrical Stimulation;Gait training;Stair training;Functional mobility training;Therapeutic activities;Therapeutic exercise;Patient/family education;Neuromuscular re-education;Balance training;Manual techniques;Passive range of motion;Scar mobilization   PT Next Visit Plan Progress R knee flexion/ext. ROM as tolerated.  Send MD note after next tx.     PT Home Exercise Plan See handouts, as prescribed   Consulted and Agree with Plan of Care Patient      Patient will benefit from skilled therapeutic intervention in order to improve the following deficits and impairments:  Abnormal gait, Improper body mechanics, Pain, Postural dysfunction, Decreased mobility, Decreased activity tolerance, Decreased endurance, Decreased range of motion, Decreased strength, Hypomobility, Impaired flexibility, Difficulty walking, Decreased safety awareness  Visit  Diagnosis: Acute pain of right knee  Joint stiffness of knee, right  Muscle weakness (generalized)     Problem List Patient Active Problem List   Diagnosis Date Noted  . Elevated blood sugar 09/05/2015  . OSA (obstructive sleep apnea) 08/06/2015  . Exercise-induced asthma 04/29/2015  . GERD without esophagitis 04/29/2015  . Hx of abnormal cervical Papanicolaou smear 04/29/2015  . Degenerative arthritis of knee, bilateral 03/08/2015   Pura Spice, PT, DPT # [id number] Willodean Rosenthal, SPT 09/25/2016, 9:25 AM  [place] [address] [place], [place], [address] Phone: [phone]  Fax:  [phone]  Name: Kaitlyn Mcdaniel MRN: [id number] Date of Birth: 1970-10-11

## 2016-09-25 ENCOUNTER — Ambulatory Visit: Payer: 59 | Admitting: Physical Therapy

## 2016-09-25 DIAGNOSIS — R262 Difficulty in walking, not elsewhere classified: Secondary | ICD-10-CM | POA: Diagnosis not present

## 2016-09-25 DIAGNOSIS — M6281 Muscle weakness (generalized): Secondary | ICD-10-CM

## 2016-09-25 DIAGNOSIS — M25561 Pain in right knee: Secondary | ICD-10-CM | POA: Diagnosis not present

## 2016-09-25 DIAGNOSIS — M25661 Stiffness of right knee, not elsewhere classified: Secondary | ICD-10-CM

## 2016-09-25 NOTE — Therapy (Signed)
[facility] [address] [place], [place], [address] Phone: [phone]   Fax:  [phone]  Physical Therapy Treatment  Patient Details  Name: Kaitlyn Mcdaniel MRN: [id number] Date of Birth: Nov 30, 1970 Referring Provider: Dr. Jones Bales.   Encounter Date: 09/25/2016      PT End of Session - 09/25/16 1433    Visit Number 6   Number of Visits 12   Date for PT Re-Evaluation 10/14/16   PT Start Time 1426   PT Stop Time 1523   PT Time Calculation (min) 57 min   Activity Tolerance Patient tolerated treatment well;Patient limited by pain   Behavior During Therapy [facility] for tasks assessed/performed      No past medical history on file.  Past Surgical History:  Procedure Laterality Date  . BREAST REDUCTION SURGERY  1999  . CERVIX LESION DESTRUCTION  1998   abnormal PAP  . REDUCTION MAMMAPLASTY Bilateral 1999    There were no vitals filed for this visit.      Subjective Assessment - 09/25/16 1433    Subjective Pt. reports 5/10 R knee pain at rest.  Pt. entered PT clinic with use of SPC.  Pt. no longer using sheet assist to get in/out of car or bed.  Pt. reports less cramping during the day/night.       Patient is accompained by: Family member   Pertinent History 19 stairs at home and has been able to complete since surgery.     Limitations Lifting;Standing;Sitting;Walking;House hold activities;Other (comment)   Patient Stated Goals Increase R knee AROM/ strength to improve mobility/ return to work.     Currently in Pain? Yes   Pain Score 5    Pain Location Knee   Pain Orientation Right   Pain Type Surgical pain      R knee flexion:    OBJECTIVE:   There.ex: Nustep L6 seat #8 with knee flexion/ static hold focus 13 min. (warm-up/ no charge).  L sidelying clamshells/ hip abd. 20x each.  L sidelying hip flexor manual stretches 3x with hold.    Supine R knee ex.: SAQ/ SLR with AAROM/ heel slides/ quad sets/ LAQ in  sitting 20x. Pt. Reports increase R lateral knee/ medial patella discomfort with flexion/ palpation. Standing hip flexion/ knee flexion/ heel raises 20x each.  Reciprocal stair climbing with ascending/ step to pattern with descending stairs 5x 4 stairs.    Manual tx.: Supine R knee AA/PROM 10x flexion with hold (very pain limited/ guarded today with increase knee flexion). R hip/knee flexion manual PROM in supine as tolerate 5x.  Seated R knee contract-relax 4x with holds focusing on increasing knee flexion. STM to R distal quad/hamstring in supine position (tenderness/no cramping noted).       Pt response for medical necessity: Benefits from progression of R knee ROM/ stability to improve pain-free mobility and progression to independent ambulation.        PT Long Term Goals - 09/19/16 0841      PT LONG TERM GOAL #1   Title Pt. independent with HEP to increase R knee AROM (0 to 115 deg.) to improve pain-free mobility.     Baseline R knee (-7 to 47 deg.) AROM   Time 4   Period Weeks   Status New     PT LONG TERM GOAL #2   Title Pt. will increase LEFS to >40 out of 80 to improve pain-free mobility/ return to work.  Baseline LEFS: 4 out of 80 on 09/16/16    Time 4   Period Weeks   Status New     PT LONG TERM GOAL #3   Title Pt. able to ambulate community distance with normalized gait pattern with least assistive device safely to improve daily mobility.    Baseline pt. using RW and limited R hip/knee flexion.     Time 4   Period Weeks   Status New     PT LONG TERM GOAL #4   Title Pt. able to ascend/descend 19 stairs at home with recip. pattern and no UE assist safety to improve ability to get to 2nd floor bedroom.     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     Baseline currently out of work   Time 4   Period Weeks    Status New            Plan - 09/25/16 1434    Clinical Impression Statement R knee AROM: 85 deg.  Pt. continues to show consistent progress with R knee ROM/ strength and independence with walking every tx. session.  Pt. remains limited with R knee flexion >80 deg. in supine and sitting position.  R hip/knee flexion limited with swingthrough phase of gait   Discuss MD f/u and check scar healing.    Rehab Potential Good   PT Frequency 3x / week   PT Duration 4 weeks   PT Treatment/Interventions ADLs/Self Care Home Management;Aquatic Therapy;Cryotherapy;Electrical Stimulation;Gait training;Stair training;Functional mobility training;Therapeutic activities;Therapeutic exercise;Patient/family education;Neuromuscular re-education;Balance training;Manual techniques;Passive range of motion;Scar mobilization   PT Next Visit Plan Progress R knee flexion/ext. ROM as tolerated.  Discuss MD f/u appt./ reassess scar.   PT Home Exercise Plan See handouts, as prescribed   Consulted and Agree with Plan of Care Patient      Patient will benefit from skilled therapeutic intervention in order to improve the following deficits and impairments:  Abnormal gait, Improper body mechanics, Pain, Postural dysfunction, Decreased mobility, Decreased activity tolerance, Decreased endurance, Decreased range of motion, Decreased strength, Hypomobility, Impaired flexibility, Difficulty walking, Decreased safety awareness  Visit Diagnosis: Acute pain of right knee  Joint stiffness of knee, right  Muscle weakness (generalized)  Difficulty in walking, not elsewhere classified     Problem List Patient Active Problem List   Diagnosis Date Noted  . Elevated blood sugar 09/05/2015  . OSA (obstructive sleep apnea) 08/06/2015  . Exercise-induced asthma 04/29/2015  . GERD without esophagitis 04/29/2015  . Hx of abnormal cervical Papanicolaou smear 04/29/2015  . Degenerative arthritis of knee, bilateral 03/08/2015    Pura Spice, PT, DPT # [phone] 09/26/2016, 3:37 PM  [facility] [address] [place], [place], [address] Phone: [phone]   Fax:  [phone]  Name: Kaitlyn Mcdaniel MRN: [id number] Date of Birth: Jul 30, 1971

## 2016-09-29 ENCOUNTER — Ambulatory Visit: Payer: 59 | Admitting: Physical Therapy

## 2016-09-29 ENCOUNTER — Encounter: Payer: Self-pay | Admitting: Physical Therapy

## 2016-09-29 DIAGNOSIS — M25661 Stiffness of right knee, not elsewhere classified: Secondary | ICD-10-CM | POA: Diagnosis not present

## 2016-09-29 DIAGNOSIS — M6281 Muscle weakness (generalized): Secondary | ICD-10-CM | POA: Diagnosis not present

## 2016-09-29 DIAGNOSIS — R262 Difficulty in walking, not elsewhere classified: Secondary | ICD-10-CM

## 2016-09-29 DIAGNOSIS — M25561 Pain in right knee: Secondary | ICD-10-CM

## 2016-09-29 NOTE — Therapy (Signed)
[facility] [address] [place], [place], [address] Phone: [phone]   Fax:  [phone]  Physical Therapy Treatment  Patient Details  Name: Kaitlyn Mcdaniel MRN: [id number] Date of Birth: 12/28/1970 Referring Provider: Dr. Jones Bales.   Encounter Date: 09/29/2016      PT End of Session - 09/29/16 1422    Visit Number 7   Number of Visits 12   Date for PT Re-Evaluation 10/14/16   PT Start Time 1418   PT Stop Time 1525   PT Time Calculation (min) 67 min   Activity Tolerance Patient tolerated treatment well;Patient limited by pain   Behavior During Therapy [facility] for tasks assessed/performed      History reviewed. No pertinent past medical history.  Past Surgical History:  Procedure Laterality Date  . BREAST REDUCTION SURGERY  1999  . CERVIX LESION DESTRUCTION  1998   abnormal PAP  . REDUCTION MAMMAPLASTY Bilateral 1999    There were no vitals filed for this visit.      Subjective Assessment - 09/29/16 1419    Subjective Pt. reports 5/10 R knee pain at rest.  Pt. entered PT clinic with use of SPC. Pt. had staples removed from incision site last Friday.      Patient is accompained by: Family member   Pertinent History 19 stairs at home and has been able to complete since surgery.     Limitations Lifting;Standing;Sitting;Walking;House hold activities;Other (comment)   Patient Stated Goals Increase R knee AROM/ strength to improve mobility/ return to work.     Currently in Pain? Yes   Pain Score 5    Pain Location Knee   Pain Orientation Right   Pain Type Surgical pain       Ther.ex:  Nustep L6 seat #8 with knee flexion/ static hold focus 13 min. (warm-up/ no charge). Standing R lunges on 2nd step 30 sec holds x 20.  Supine R knee ex.: Supine knee to chest w/ ball and ~90 deg. flexion hold 20x. R Quad sets with PT cuing w/ hand under knee 10x.  TG knee flexion 2x20, heel raises 20x. Standing R/L hip abduction,  ext., flexion, SLR, (x20 each side) and partial squats in //-bars bilat. UE assist x 20. Pt. Showed no fatigue and no increased pain w/ exercise.   Manual tx: patellar mobs (sup., inf., med., lat.) x10, AA/PROM R knee to 90 deg. 30 sec. Static holds. (Pt. Tolerating increased knee flexion well).  Good patellar tracking.         Pt response for medical necessity:  Benefits from progression of R knee ROM/ stability to improve pain-free mobility and progression to independent ambulation.  Pt. Ambulating with improved gait/ confidence around PT clinic.           PT Long Term Goals - 09/19/16 0841      PT LONG TERM GOAL #1   Title Pt. independent with HEP to increase R knee AROM (0 to 115 deg.) to improve pain-free mobility.     Baseline R knee (-7 to 47 deg.) AROM   Time 4   Period Weeks   Status New     PT LONG TERM GOAL #2   Title Pt. will increase LEFS to >40 out of 80 to improve pain-free mobility/ return to work.     Baseline LEFS: 4 out of 80 on 09/16/16    Time 4   Period Weeks   Status New  PT LONG TERM GOAL #3   Title Pt. able to ambulate community distance with normalized gait pattern with least assistive device safely to improve daily mobility.    Baseline pt. using RW and limited R hip/knee flexion.     Time 4   Period Weeks   Status New     PT LONG TERM GOAL #4   Title Pt. able to ascend/descend 19 stairs at home with recip. pattern and no UE assist safety to improve ability to get to 2nd floor bedroom.     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     Baseline currently out of work   Time 4   Period Weeks   Status New             Plan - 09/29/16 1428    Clinical Impression Statement Good incision healing noted with steristrips in place.  Minimal warmth around R knee joint with several healing scabs present.  Pt.  instructed to avoid picking at scabs and not use lotion on incision at this time.  R knee AROM: 91 deg. Pt. continues to show consistent progress with R knee ROM/ strength and independence with walking every tx. session. Pt. progressing through standing hip exercises (flex, ext, abd, SLR, squats) w/ no increase in pain and no asymmetries in hip strength bilat.    Rehab Potential Good   PT Frequency 3x / week   PT Duration 4 weeks   PT Treatment/Interventions ADLs/Self Care Home Management;Aquatic Therapy;Cryotherapy;Electrical Stimulation;Gait training;Stair training;Functional mobility training;Therapeutic activities;Therapeutic exercise;Patient/family education;Neuromuscular re-education;Balance training;Manual techniques;Passive range of motion;Scar mobilization   PT Next Visit Plan Progress R knee flexion/ext. ROM as tolerated.     PT Home Exercise Plan See handouts, as prescribed   Consulted and Agree with Plan of Care Patient      Patient will benefit from skilled therapeutic intervention in order to improve the following deficits and impairments:  Abnormal gait, Improper body mechanics, Pain, Postural dysfunction, Decreased mobility, Decreased activity tolerance, Decreased endurance, Decreased range of motion, Decreased strength, Hypomobility, Impaired flexibility, Difficulty walking, Decreased safety awareness  Visit Diagnosis: Acute pain of right knee  Joint stiffness of knee, right  Muscle weakness (generalized)  Difficulty in walking, not elsewhere classified     Problem List Patient Active Problem List   Diagnosis Date Noted  . Elevated blood sugar 09/05/2015  . OSA (obstructive sleep apnea) 08/06/2015  . Exercise-induced asthma 04/29/2015  . GERD without esophagitis 04/29/2015  . Hx of abnormal cervical Papanicolaou smear 04/29/2015  . Degenerative arthritis of knee, bilateral 03/08/2015   Pura Spice, PT, DPT # [address], SPT 09/30/2016, 9:18 AM  [facility] [address] [place], [place], [address] Phone: [phone]   Fax:  [phone]  Name: Kaitlyn Mcdaniel MRN: [id number] Date of Birth: [birth year]

## 2016-09-30 ENCOUNTER — Encounter: Payer: Self-pay | Admitting: Physical Therapy

## 2016-10-01 ENCOUNTER — Encounter: Payer: 59 | Admitting: Physical Therapy

## 2016-10-03 ENCOUNTER — Ambulatory Visit: Payer: 59 | Admitting: Physical Therapy

## 2016-10-03 DIAGNOSIS — M6281 Muscle weakness (generalized): Secondary | ICD-10-CM

## 2016-10-03 DIAGNOSIS — R262 Difficulty in walking, not elsewhere classified: Secondary | ICD-10-CM | POA: Diagnosis not present

## 2016-10-03 DIAGNOSIS — M25661 Stiffness of right knee, not elsewhere classified: Secondary | ICD-10-CM | POA: Diagnosis not present

## 2016-10-03 DIAGNOSIS — M25561 Pain in right knee: Secondary | ICD-10-CM

## 2016-10-03 NOTE — Therapy (Signed)
[facility] [address] [place], [place], [address] Phone: [phone]   Fax:  [phone]  Physical Therapy Treatment  Patient Details  Name: Kaitlyn Mcdaniel MRN: [id number] Date of Birth: [birth year] Referring Provider: Dr. Jones Bales.   Encounter Date: 10/03/2016      PT End of Session - 10/03/16 0826    Visit Number 8   Number of Visits 12   Date for PT Re-Evaluation 10/14/16   PT Start Time 0812   PT Stop Time 0924   PT Time Calculation (min) 72 min   Activity Tolerance Patient tolerated treatment well;Patient limited by pain   Behavior During Therapy [facility] for tasks assessed/performed      No past medical history on file.  Past Surgical History:  Procedure Laterality Date  . BREAST REDUCTION SURGERY  1999  . CERVIX LESION DESTRUCTION  1998   abnormal PAP  . REDUCTION MAMMAPLASTY Bilateral 1999    There were no vitals filed for this visit.      Subjective Assessment - 10/03/16 0822    Subjective Pt. reports 4/10 R knee pain currently at rest.  Pt. states she is compliant with HEP and steristrips have fallen off.  No new issues reported.     Patient is accompained by: Family member   Pertinent History 19 stairs at home and has been able to complete since surgery.     Limitations Lifting;Standing;Sitting;Walking;House hold activities;Other (comment)   Patient Stated Goals Increase R knee AROM/ strength to improve mobility/ return to work.     Currently in Pain? Yes   Pain Score 4    Pain Location Knee   Pain Orientation Right   Pain Type Surgical pain      Ther.exBarbie Haggis #8-7with knee flexion/ static hold focus 13+min. (warm-up/ no charge).  Walking in //-bars: forward/ backwards/ lateral 5x each (mirror feedback with min. To no UE assist).  Walking L/R lunges in //-bars 5x.  Standing GTB hip ex.(flexion/ abd./ ext.)- 15x2. Cuing to maintain controlled eccentric return to standing and to  maintain midline hip flexion. BOSU step ups/downs 10x2. Good ankle control with mod. UE assist to maintain balance. Pt. Showed no fatigue and no increased pain w/ exercise.    Manual tx: Supine AA/PROM R knee to 95 deg. 30 sec. Static holds. (Pt. Tolerating increased knee flexion well). Seated R knee contract/relax blue mat table. Patellar mobs. (all planes)- good mobility noted.  Scar assessment: healing well.  No steristrips in place and instructed to hold off on using lotion until next week.      Pt response for medical necessity: Benefits from progression of R knee ROM/ stability to improve pain-free mobility and progression to independent ambulation.  Pt. Ambulating with improved gait/ confidence around PT clinic.         PT Long Term Goals - 10/03/16 0932      PT LONG TERM GOAL #1   Title Pt. independent with HEP to increase R knee AROM (0 to 115 deg.) to improve pain-free mobility.     Baseline -2 to 95 deg. R knee AAROM   Time 4   Period Weeks   Status Partially Met     PT LONG TERM GOAL #2   Title Pt. will increase LEFS to >40 out of 80 to improve pain-free mobility/ return to work.     Baseline LEFS: 35 out of 80 on 10/03/16    Time 4   Period  Weeks   Status Not Met     PT LONG TERM GOAL #3   Title Pt. able to ambulate community distance with normalized gait pattern with least assistive device safely to improve daily mobility.    Baseline R antalgic gait with use of SPC and decr. stance time on R with limited R hip/knee flexion.     Time 4   Period Weeks   Status Not Met     PT LONG TERM GOAL #4   Title Pt. able to ascend/descend 19 stairs at home with recip. pattern and no UE assist safety to improve ability to get to 2nd floor bedroom.     Baseline Recip. gait with ascending/ step to with descending.   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      Baseline currently out of work   Time 4   Period Weeks   Status On-going             Plan - 10/03/16 0827    Clinical Impression Statement R knee AAROM: -2 to 95 deg.  Pt. demonstrates decreased stance time on R ambulating with SPC. Increased hip hike on R descending stairs with recip. step pattern. Pt. limited by pain during sitting contract-relax of R quad.  Pt. able to complete 3-way standing hip exercises with good form and cueing for midline hip flexion. Pt. completed LEFS: 35/80 (marked improvement since initial evaluation).  Pt. progressing well toward goals and will benefit from skilled PT services to keep increasing R knee flexion and strength to promote RTW in 2 weeks.    Rehab Potential Good   PT Frequency 3x / week   PT Duration 4 weeks   PT Treatment/Interventions ADLs/Self Care Home Management;Aquatic Therapy;Cryotherapy;Electrical Stimulation;Gait training;Stair training;Functional mobility training;Therapeutic activities;Therapeutic exercise;Patient/family education;Neuromuscular re-education;Balance training;Manual techniques;Passive range of motion;Scar mobilization   PT Next Visit Plan Progress R knee flexion/ext. ROM as tolerated.     PT Home Exercise Plan See handouts, as prescribed   Consulted and Agree with Plan of Care Patient      Patient will benefit from skilled therapeutic intervention in order to improve the following deficits and impairments:  Abnormal gait, Improper body mechanics, Pain, Postural dysfunction, Decreased mobility, Decreased activity tolerance, Decreased endurance, Decreased range of motion, Decreased strength, Hypomobility, Impaired flexibility, Difficulty walking, Decreased safety awareness  Visit Diagnosis: Acute pain of right knee  Joint stiffness of knee, right  Muscle weakness (generalized)  Difficulty in walking, not elsewhere classified     Problem List Patient Active Problem List   Diagnosis Date Noted  . Elevated  blood sugar 09/05/2015  . OSA (obstructive sleep apnea) 08/06/2015  . Exercise-induced asthma 04/29/2015  . GERD without esophagitis 04/29/2015  . Hx of abnormal cervical Papanicolaou smear 04/29/2015  . Degenerative arthritis of knee, bilateral 03/08/2015   Pura Spice, PT, DPT # [phone] 10/03/2016, 9:47 AM  [facility] [address] [place], [place], [address] Phone: [phone]   Fax:  [phone]  Name: MANILA ROMMEL MRN: [id number] Date of Birth: Feb 06, 1971

## 2016-10-07 ENCOUNTER — Ambulatory Visit: Payer: 59 | Admitting: Physical Therapy

## 2016-10-07 DIAGNOSIS — M25661 Stiffness of right knee, not elsewhere classified: Secondary | ICD-10-CM

## 2016-10-07 DIAGNOSIS — M25561 Pain in right knee: Secondary | ICD-10-CM | POA: Diagnosis not present

## 2016-10-07 DIAGNOSIS — M6281 Muscle weakness (generalized): Secondary | ICD-10-CM | POA: Diagnosis not present

## 2016-10-07 DIAGNOSIS — R262 Difficulty in walking, not elsewhere classified: Secondary | ICD-10-CM

## 2016-10-07 NOTE — Therapy (Signed)
[facility] [address] [place], [place], [address] Phone: [phone]   Fax:  [phone]  Physical Therapy Treatment  Patient Details  Name: Kaitlyn Mcdaniel MRN: [id number] Date of Birth: 09/07/1971 Referring Provider: Dr. Jones Bales.   Encounter Date: 10/07/2016      PT End of Session - 10/07/16 1751    Visit Number 9   Number of Visits 12   Date for PT Re-Evaluation 10/14/16   PT Start Time 2725   PT Stop Time 1527   PT Time Calculation (min) 56 min   Activity Tolerance Patient tolerated treatment well;Patient limited by pain   Behavior During Therapy [facility] for tasks assessed/performed      No past medical history on file.  Past Surgical History:  Procedure Laterality Date  . BREAST REDUCTION SURGERY  1999  . CERVIX LESION DESTRUCTION  1998   abnormal PAP  . REDUCTION MAMMAPLASTY Bilateral 1999    There were no vitals filed for this visit.      Subjective Assessment - 10/07/16 1750    Subjective Pt. entered PT clinic with use of SPC and reports she is doing well today. Pt has had her sister in town and stated she has been sleeping better recently.    Patient is accompained by: Family member   Pertinent History 19 stairs at home and has been able to complete since surgery.     Limitations Lifting;Standing;Sitting;Walking;House hold activities;Other (comment)   Patient Stated Goals Increase R knee AROM/ strength to improve mobility/ return to work.     Currently in Pain? Yes   Pain Score 3    Pain Location Knee   Pain Orientation Right   Pain Type Surgical pain      There.ex.: Nustep L6 Seat 7 10 min (warm-up/no charge) Seated contract/relax R quad - Pt. Tolerated pain well today w/ improving knee flexion. Stairs ascending (reciprocal gait pattern); descending step-to gait pattern heel taps to floor from step - 3 inch. steps Squats/heel raise/single leg squat in TG 20x R/L - cuing to maintain  eccentric control during heel raises, good knee alignment noted during squats   Manual tx: Supine AAROM/AROM R knee flexion/ext. 30 sec static holds R knee to 101 deg. (Pt. Tolerating increased knee flexion well). Seated R knee contract/relax blue mat table.  Scar assessment: healing well.  No steristrips in place and will begin scar massage next tx sesson.    Pt response for medical necessity: Benefits from progression of R knee ROM/ stability to improve pain-free mobility and progression to independent ambulation. Pt. Ambulating with improved gait/ confidence around PT clinic.            PT Long Term Goals - 10/03/16 0932      PT LONG TERM GOAL #1   Title Pt. independent with HEP to increase R knee AROM (0 to 115 deg.) to improve pain-free mobility.     Baseline -2 to 95 deg. R knee AAROM   Time 4   Period Weeks   Status Partially Met     PT LONG TERM GOAL #2   Title Pt. will increase LEFS to >40 out of 80 to improve pain-free mobility/ return to work.     Baseline LEFS: 35 out of 80 on 10/03/16    Time 4   Period Weeks   Status Not Met     PT LONG TERM GOAL #3   Title Pt. able to ambulate community distance with  normalized gait pattern with least assistive device safely to improve daily mobility.    Baseline R antalgic gait with use of SPC and decr. stance time on R with limited R hip/knee flexion.     Time 4   Period Weeks   Status Not Met     PT LONG TERM GOAL #4   Title Pt. able to ascend/descend 19 stairs at home with recip. pattern and no UE assist safety to improve ability to get to 2nd floor bedroom.     Baseline Recip. gait with ascending/ step to with descending.   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     Baseline currently out of work   Time 4   Period Weeks   Status On-going            Plan - 10/07/16 1752    Clinical  Impression Statement R knee AAROM: -2 to 101 deg. Pt. is less pain-limited today and demonstrated good form with [employer] squats, heel raises, and single leg squats by maintaining midline knee alignment throughout. Pt's gait pattern improving with stance time on R increasing from last visit. Pt. is progressing with all R knee strengthening and ROM, preparing for RTW 2/5.    Rehab Potential Good   PT Frequency 3x / week   PT Duration 4 weeks   PT Treatment/Interventions ADLs/Self Care Home Management;Aquatic Therapy;Cryotherapy;Electrical Stimulation;Gait training;Stair training;Functional mobility training;Therapeutic activities;Therapeutic exercise;Patient/family education;Neuromuscular re-education;Balance training;Manual techniques;Passive range of motion;Scar mobilization   PT Next Visit Plan Progress R knee flexion/ext. ROM as tolerated.     PT Home Exercise Plan See handouts, as prescribed   Consulted and Agree with Plan of Care Patient      Patient will benefit from skilled therapeutic intervention in order to improve the following deficits and impairments:  Abnormal gait, Improper body mechanics, Pain, Postural dysfunction, Decreased mobility, Decreased activity tolerance, Decreased endurance, Decreased range of motion, Decreased strength, Hypomobility, Impaired flexibility, Difficulty walking, Decreased safety awareness  Visit Diagnosis: Acute pain of right knee  Joint stiffness of knee, right  Muscle weakness (generalized)  Difficulty in walking, not elsewhere classified     Problem List Patient Active Problem List   Diagnosis Date Noted  . Elevated blood sugar 09/05/2015  . OSA (obstructive sleep apnea) 08/06/2015  . Exercise-induced asthma 04/29/2015  . GERD without esophagitis 04/29/2015  . Hx of abnormal cervical Papanicolaou smear 04/29/2015  . Degenerative arthritis of knee, bilateral 03/08/2015   Pura Spice, PT, DPT # [id number], SPT 10/08/2016,  7:53 AM  [facility] [address] [place], [place], [address] Phone: [phone]   Fax:  [phone]  Name: Kaitlyn Mcdaniel MRN: [id number] Date of Birth: September 07, 1971

## 2016-10-09 ENCOUNTER — Ambulatory Visit: Payer: 59 | Admitting: Physical Therapy

## 2016-10-09 DIAGNOSIS — M25561 Pain in right knee: Secondary | ICD-10-CM | POA: Diagnosis not present

## 2016-10-09 DIAGNOSIS — M6281 Muscle weakness (generalized): Secondary | ICD-10-CM | POA: Diagnosis not present

## 2016-10-09 DIAGNOSIS — R262 Difficulty in walking, not elsewhere classified: Secondary | ICD-10-CM

## 2016-10-09 DIAGNOSIS — M25661 Stiffness of right knee, not elsewhere classified: Secondary | ICD-10-CM | POA: Diagnosis not present

## 2016-10-09 NOTE — Therapy (Signed)
[facility] [address] [place], [place], [address] Phone: [phone]   Fax:  [phone]  Physical Therapy Treatment  Patient Details  Name: Kaitlyn Mcdaniel MRN: [id number] Date of Birth: 06/17/1971 Referring Provider: Dr. Jones Bales.   Encounter Date: 10/09/2016      PT End of Session - 10/09/16 1443    Visit Number 10   Number of Visits 12   Date for PT Re-Evaluation 10/14/16   PT Start Time 1694   PT Stop Time 1531   PT Time Calculation (min) 58 min   Activity Tolerance Patient tolerated treatment well;Patient limited by pain   Behavior During Therapy [facility] for tasks assessed/performed      No past medical history on file.  Past Surgical History:  Procedure Laterality Date  . BREAST REDUCTION SURGERY  1999  . CERVIX LESION DESTRUCTION  1998   abnormal PAP  . REDUCTION MAMMAPLASTY Bilateral 1999    There were no vitals filed for this visit.      Subjective Assessment - 10/09/16 1441    Subjective Pt. reports 4/10 pain today in R knee. Pt. reports she has been walking at home without [facility].  Pt. states she is sleeping better at night.     Patient is accompained by: Family member   Pertinent History 19 stairs at home and has been able to complete since surgery.     Limitations Lifting;Standing;Sitting;Walking;House hold activities;Other (comment)   Patient Stated Goals Increase R knee AROM/ strength to improve mobility/ return to work.     Currently in Pain? Yes   Pain Score 4    Pain Location Knee   Pain Orientation Right   Pain Descriptors / Indicators Aching   Pain Type Surgical pain       Therex.: Nustep L6 Seat 7 10 min (warm-up/no charge) Seated contract/relax R quad - Pt. Tolerated pain well today w/ improving knee flexion. Stairs ascending (reciprocal gait pattern); descending reciprocal gait pattern Squats/heel raise/single leg squat in TG 20x R/L - good knee alignment noted during squats.  Knee ext. Maintained throughout heel raises.   Manual tx: Supine AAROM/AROM R knee flexion/ext. 30 sec static holds R knee to 108  deg. (Pt. Tolerating increased knee flexion well). Seated R knee contract/relax blue mat table. Patellar mobs (all planes),  Scar healing well, began massage today.   Pt response for medical necessity: Benefits from progression of R knee ROM/ stability to improve pain-free mobility and progression to independent ambulation. Pt. Ambulating with improved gait/ confidence around PT clinic.          PT Long Term Goals - 10/03/16 0932      PT LONG TERM GOAL #1   Title Pt. independent with HEP to increase R knee AROM (0 to 115 deg.) to improve pain-free mobility.     Baseline -2 to 95 deg. R knee AAROM   Time 4   Period Weeks   Status Partially Met     PT LONG TERM GOAL #2   Title Pt. will increase LEFS to >40 out of 80 to improve pain-free mobility/ return to work.     Baseline LEFS: 35 out of 80 on 10/03/16    Time 4   Period Weeks   Status Not Met     PT LONG TERM GOAL #3   Title Pt. able to ambulate community distance with normalized gait pattern with least assistive device safely to improve daily mobility.  Baseline R antalgic gait with use of SPC and decr. stance time on R with limited R hip/knee flexion.     Time 4   Period Weeks   Status Not Met     PT LONG TERM GOAL #4   Title Pt. able to ascend/descend 19 stairs at home with recip. pattern and no UE assist safety to improve ability to get to 2nd floor bedroom.     Baseline Recip. gait with ascending/ step to with descending.   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     Baseline currently out of work   Time 4   Period Weeks   Status On-going             Plan - 10/09/16 1619    Clinical Impression Statement R Knee AAROM: -2 to 108 deg. Pt's incision  healing nicely and began scar massage in tx today. Pt. is ambulating with SPC but has begun going without it at home. Gait in tx today w/o SPC progressing w/ slight hip hike.  R knee/hip flexion and heel strike has improved from last tx.  Descended stairs with reciprocal pattern and no UE assist for first time in tx today with no issues.   Good patella and scar mobility (all planes).     Rehab Potential Good   PT Frequency 3x / week   PT Duration 4 weeks   PT Treatment/Interventions ADLs/Self Care Home Management;Aquatic Therapy;Cryotherapy;Electrical Stimulation;Gait training;Stair training;Functional mobility training;Therapeutic activities;Therapeutic exercise;Patient/family education;Neuromuscular re-education;Balance training;Manual techniques;Passive range of motion;Scar mobilization   PT Next Visit Plan Progress R knee flexion/ext. ROM as tolerated.  Pt. planning on returning to work in 11 days.     PT Home Exercise Plan See handouts, as prescribed   Consulted and Agree with Plan of Care Patient      Patient will benefit from skilled therapeutic intervention in order to improve the following deficits and impairments:  Abnormal gait, Improper body mechanics, Pain, Postural dysfunction, Decreased mobility, Decreased activity tolerance, Decreased endurance, Decreased range of motion, Decreased strength, Hypomobility, Impaired flexibility, Difficulty walking, Decreased safety awareness  Visit Diagnosis: Acute pain of right knee  Joint stiffness of knee, right  Muscle weakness (generalized)  Difficulty in walking, not elsewhere classified     Problem List Patient Active Problem List   Diagnosis Date Noted  . Elevated blood sugar 09/05/2015  . OSA (obstructive sleep apnea) 08/06/2015  . Exercise-induced asthma 04/29/2015  . GERD without esophagitis 04/29/2015  . Hx of abnormal cervical Papanicolaou smear 04/29/2015  . Degenerative arthritis of knee, bilateral 03/08/2015   Pura Spice, PT, DPT # [id number], SPT 10/10/2016, 2:20 PM  [facility] [address] [place], [place], [address] Phone: [phone]   Fax:  [phone]  Name: MIREYA MEDITZ MRN: [id number] Date of Birth: 1971-02-28

## 2016-10-15 ENCOUNTER — Encounter: Payer: Self-pay | Admitting: Physical Therapy

## 2016-10-15 ENCOUNTER — Ambulatory Visit: Payer: 59 | Admitting: Physical Therapy

## 2016-10-15 DIAGNOSIS — M25661 Stiffness of right knee, not elsewhere classified: Secondary | ICD-10-CM | POA: Diagnosis not present

## 2016-10-15 DIAGNOSIS — R262 Difficulty in walking, not elsewhere classified: Secondary | ICD-10-CM

## 2016-10-15 DIAGNOSIS — M25561 Pain in right knee: Secondary | ICD-10-CM | POA: Diagnosis not present

## 2016-10-15 DIAGNOSIS — M6281 Muscle weakness (generalized): Secondary | ICD-10-CM | POA: Diagnosis not present

## 2016-10-15 NOTE — Therapy (Signed)
[facility] [address] [place], [place], [address] Phone: [phone]   Fax:  [phone]  Physical Therapy Treatment  Patient Details  Name: Kaitlyn Mcdaniel MRN: [id number] Date of Birth: March 09, 1971 Referring Provider: Dr. Jones Bales.   Encounter Date: 10/15/2016      PT End of Session - 10/15/16 1436    Visit Number 11   Number of Visits 18   Date for PT Re-Evaluation 11/11/16   PT Start Time 4920   PT Stop Time 1525   PT Time Calculation (min) 57 min   Activity Tolerance Patient tolerated treatment well;Patient limited by pain   Behavior During Therapy [facility] for tasks assessed/performed      History reviewed. No pertinent past medical history.  Past Surgical History:  Procedure Laterality Date  . BREAST REDUCTION SURGERY  1999  . CERVIX LESION DESTRUCTION  1998   abnormal PAP  . REDUCTION MAMMAPLASTY Bilateral 1999    There were no vitals filed for this visit.      Subjective Assessment - 10/15/16 1430    Subjective Pt. entered PT clinic today w/o SPC and reports some adhesion with scar. Has been working scar at home.    Patient is accompained by: Family member   Pertinent History 19 stairs at home and has been able to complete since surgery.     Limitations Lifting;Standing;Sitting;Walking;House hold activities;Other (comment)   Patient Stated Goals Increase R knee AROM/ strength to improve mobility/ return to work.     Currently in Pain? Yes   Pain Score 3    Pain Location Knee   Pain Orientation Right   Pain Type Surgical pain       Therex.: Nustep L6 Seat 7 10 min (warm-up/no charge) Hallway sled-pushing 4x (90 lbs) - no incr. Pain/some fatigue noted  Stairs ascending (reciprocal gait pattern); descending reciprocal gait pattern. No Ue/finger tip assist Squats (VMO/VL isolated) /heel raises/single leg squatin TG20x R/L- good knee alignment noted during squats. Knee ext. Maintained throughout  heel raises.   BOSU step-ups in //-bars (no assist) 10x R/L. Good ankle control noted Reverse BOSU partial squats 10x. Improved WB on R w/ incr. Knee flexion  Manual tx: Supine AAROM/AROM R knee flexion/ext. 30 secstaticholds R knee to 112 deg. (Pt. Tolerating increased knee flexion well). Seated R knee contract/relax blue mat table. , Scar healing well, some adhesing noted. Supine hamstring/gastroc stretching 30 sec. Static holds.   Pt response for medical necessity: Benefits from progression of R knee ROM/ stability to improve pain-free mobility and progression to independent ambulation. Pt. Ambulating with improved gait/ confidence around PT clinic         PT Long Term Goals - 10/15/16 1837      PT LONG TERM GOAL #1   Title Pt. independent with HEP to increase R knee AROM (0 to 115 deg.) to improve pain-free mobility.     Baseline -3 to 113 deg. R knee AAROM   Time 4   Period Weeks   Status Partially Met     PT LONG TERM GOAL #2   Title Pt. will increase LEFS to >40 out of 80 to improve pain-free mobility/ return to work.     Baseline LEFS: 35 out of 80 on 10/03/16    Time 4   Period Weeks   Status On-going     PT LONG TERM GOAL #3   Title Pt. able to ambulate community distance with normalized gait pattern with  least assistive device safely to improve daily mobility.    Baseline Improving gait pattern without use of SPC on level surfaces.  Pt. recommends SPC with uneven/ outside terrain.    Time 4   Period Weeks   Status Partially Met     PT LONG TERM GOAL #4   Title Pt. able to ascend/descend 19 stairs at home with recip. pattern and no UE assist safety to improve ability to get to 2nd floor bedroom.     Baseline Recip. gait with ascending/ descending.   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     Baseline currently out of work   Time 4   Period  Weeks   Status On-going            Plan - 10/15/16 1445    Clinical Impression Statement R Knee AAROM: -3 to 112 deg. R knee AROM: 110 deg.  Pt's incision healing nicely with some adhesion present midway down incision. Pt. is ambulating without use of SPC mostly and is progressing toward a normalized gait pattern with decreased hip hike and good B heelstrike. R knee/hip flexion and heel strike has improved from last tx.  Ascends/descends stairs with reciprocal pattern and no UE assist for first time in tx today with no issues. Good patella and scar mobility (all planes).  Pt. planning on returning to work part-time next week.  Pt. will benefit from continued PT 1-2x/week for next several weeks during transition back to work.     Rehab Potential Good   PT Frequency 2x / week   PT Duration 4 weeks   PT Treatment/Interventions ADLs/Self Care Home Management;Aquatic Therapy;Cryotherapy;Electrical Stimulation;Gait training;Stair training;Functional mobility training;Therapeutic activities;Therapeutic exercise;Patient/family education;Neuromuscular re-education;Balance training;Manual techniques;Passive range of motion;Scar mobilization   PT Next Visit Plan Send MD progress note with knee ROM.  Discuss RTW.     PT Home Exercise Plan See handouts, as prescribed   Consulted and Agree with Plan of Care Patient      Patient will benefit from skilled therapeutic intervention in order to improve the following deficits and impairments:  Abnormal gait, Improper body mechanics, Pain, Postural dysfunction, Decreased mobility, Decreased activity tolerance, Decreased endurance, Decreased range of motion, Decreased strength, Hypomobility, Impaired flexibility, Difficulty walking, Decreased safety awareness  Visit Diagnosis: Acute pain of right knee  Joint stiffness of knee, right  Muscle weakness (generalized)  Difficulty in walking, not elsewhere classified     Problem List Patient Active Problem  List   Diagnosis Date Noted  . Elevated blood sugar 09/05/2015  . OSA (obstructive sleep apnea) 08/06/2015  . Exercise-induced asthma 04/29/2015  . GERD without esophagitis 04/29/2015  . Hx of abnormal cervical Papanicolaou smear 04/29/2015  . Degenerative arthritis of knee, bilateral 03/08/2015   Pura Spice, PT, DPT # [id number], SPT 10/15/2016, 6:39 PM  [facility] [address] [place], [place], [address] Phone: [phone]   Fax:  [phone]  Name: SATONYA LUX MRN: [id number] Date of Birth: [birth year]

## 2016-10-16 ENCOUNTER — Ambulatory Visit: Payer: 59 | Attending: Unknown Physician Specialty | Admitting: Physical Therapy

## 2016-10-16 DIAGNOSIS — M25661 Stiffness of right knee, not elsewhere classified: Secondary | ICD-10-CM | POA: Diagnosis not present

## 2016-10-16 DIAGNOSIS — M25561 Pain in right knee: Secondary | ICD-10-CM | POA: Diagnosis not present

## 2016-10-16 DIAGNOSIS — M6281 Muscle weakness (generalized): Secondary | ICD-10-CM | POA: Insufficient documentation

## 2016-10-16 DIAGNOSIS — R262 Difficulty in walking, not elsewhere classified: Secondary | ICD-10-CM | POA: Diagnosis not present

## 2016-10-16 NOTE — Therapy (Signed)
[facility] [address] [place], [place], [address] Phone: [phone]   Fax:  [phone]  Physical Therapy Treatment  Patient Details  Name: Kaitlyn Mcdaniel MRN: [id number] Date of Birth: 1970/12/04 Referring Provider: Dr. Jones Bales.   Encounter Date: 10/16/2016      PT End of Session - 10/15/16 1436    Visit Number 11   Number of Visits 18   Date for PT Re-Evaluation 11/11/16   PT Start Time 6962   PT Stop Time 1525   PT Time Calculation (min) 57 min   Activity Tolerance Patient tolerated treatment well;Patient limited by pain   Behavior During Therapy [facility] for tasks assessed/performed      No past medical history on file.  Past Surgical History:  Procedure Laterality Date  . BREAST REDUCTION SURGERY  1999  . CERVIX LESION DESTRUCTION  1998   abnormal PAP  . REDUCTION MAMMAPLASTY Bilateral 1999    There were no vitals filed for this visit.      Subjective Assessment - 10/16/16 1429    Subjective Pt. reports improvement with adhesions and tweaked her L leg catching herself from tripping. 3/10 R knee pain today   Patient is accompained by: Family member   Pertinent History 19 stairs at home and has been able to complete since surgery.     Limitations Lifting;Standing;Sitting;Walking;House hold activities;Other (comment)   Patient Stated Goals Increase R knee AROM/ strength to improve mobility/ return to work.     Currently in Pain? Yes   Pain Score 3    Pain Location Knee   Pain Orientation Right   Pain Type Surgical pain     Therex.: Nustep L6 Seat 6 10 min (warm-up/no charge) TG Squats (VMO/VL isolated) /heel raises/single leg squatin TG20x R/L- good knee alignment noted during squats. Knee ext. Maintained throughout heel raises. Verbal cuing for foot positioning Supine bridging w/ ball squeezes 10x2  Supine single leg bridging L side only 10x1 (muscle fatigue noted)  Standing hip abduction,  extension, marching 10x R/L each on airex fingertip assist  airex balancing eyes closed no assist  Eccentric Heel taps off 3" step 1 finger tip assist 10x R/L  Rebounder ex. (tandem/ SLS).   Manual tx: Supine AAROM/AROM R knee flexion/ext. 30 secstaticholds R knee to 116deg. (Pt. Tolerating increased knee flexion well). Scarhealing well, some adhesing from yesterday resolved  Pt response for medical necessity: Benefits from progression of R knee ROM/ stability to improve pain-free mobility and progression to independent ambulation. Pt. Ambulating with improved gait/ confidence around PT clinic          PT Long Term Goals - 10/15/16 1837      PT LONG TERM GOAL #1   Title Pt. independent with HEP to increase R knee AROM (0 to 115 deg.) to improve pain-free mobility.     Baseline -3 to 113 deg. R knee AAROM   Time 4   Period Weeks   Status Partially Met     PT LONG TERM GOAL #2   Title Pt. will increase LEFS to >40 out of 80 to improve pain-free mobility/ return to work.     Baseline LEFS: 35 out of 80 on 10/03/16    Time 4   Period Weeks   Status On-going     PT LONG TERM GOAL #3   Title Pt. able to ambulate community distance with normalized gait pattern with least assistive device safely to improve daily mobility.  Baseline Improving gait pattern without use of SPC on level surfaces.  Pt. recommends SPC with uneven/ outside terrain.    Time 4   Period Weeks   Status Partially Met     PT LONG TERM GOAL #4   Title Pt. able to ascend/descend 19 stairs at home with recip. pattern and no UE assist safety to improve ability to get to 2nd floor bedroom.     Baseline Recip. gait with ascending/ descending.   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     Baseline currently out of work   Time 4   Period Weeks   Status On-going               Plan  - 10/15/16 1445    Clinical Impression Statement R Knee AAROM: -3 to 112 deg. R knee AROM: 110 deg.  Pt's incision healing nicely with some adhesion present midway down incision. Pt. is ambulating without use of SPC mostly and is progressing toward a normalized gait pattern with decreased hip hike and good B heelstrike. R knee/hip flexion and heel strike has improved from last tx.  Ascends/descends stairs with reciprocal pattern and no UE assist for first time in tx today with no issues. Good patella and scar mobility (all planes).  Pt. planning on returning to work part-time next week.  Pt. will benefit from continued PT 1-2x/week for next several weeks during transition back to work.     Rehab Potential Good   PT Frequency 2x / week   PT Duration 4 weeks   PT Treatment/Interventions ADLs/Self Care Home Management;Aquatic Therapy;Cryotherapy;Electrical Stimulation;Gait training;Stair training;Functional mobility training;Therapeutic activities;Therapeutic exercise;Patient/family education;Neuromuscular re-education;Balance training;Manual techniques;Passive range of motion;Scar mobilization   PT Next Visit Plan Send MD progress note with knee ROM.  Discuss RTW.     PT Home Exercise Plan See handouts, as prescribed   Consulted and Agree with Plan of Care Patient      Patient will benefit from skilled therapeutic intervention in order to improve the following deficits and impairments:     Visit Diagnosis: No diagnosis found.     Problem List Patient Active Problem List   Diagnosis Date Noted  . Elevated blood sugar 09/05/2015  . OSA (obstructive sleep apnea) 08/06/2015  . Exercise-induced asthma 04/29/2015  . GERD without esophagitis 04/29/2015  . Hx of abnormal cervical Papanicolaou smear 04/29/2015  . Degenerative arthritis of knee, bilateral 03/08/2015   Pura Spice, PT, DPT # [id number] Willodean Rosenthal, SPT 10/16/2016, 3:01 PM  [facility] [address] [place], [place], [address] Phone: [phone]   Fax:  [phone]  Name: Kaitlyn Mcdaniel MRN: [id number] Date of Birth: March 21, 1971

## 2016-10-17 DIAGNOSIS — Z96651 Presence of right artificial knee joint: Secondary | ICD-10-CM | POA: Insufficient documentation

## 2016-10-21 ENCOUNTER — Other Ambulatory Visit: Payer: Self-pay | Admitting: Internal Medicine

## 2016-10-21 ENCOUNTER — Ambulatory Visit (INDEPENDENT_AMBULATORY_CARE_PROVIDER_SITE_OTHER): Payer: 59 | Admitting: Internal Medicine

## 2016-10-21 ENCOUNTER — Encounter: Payer: Self-pay | Admitting: Internal Medicine

## 2016-10-21 VITALS — BP 130/86 | HR 85 | Temp 97.7°F | Ht 63.0 in | Wt 252.0 lb

## 2016-10-21 DIAGNOSIS — R739 Hyperglycemia, unspecified: Secondary | ICD-10-CM | POA: Diagnosis not present

## 2016-10-21 DIAGNOSIS — Z0000 Encounter for general adult medical examination without abnormal findings: Secondary | ICD-10-CM | POA: Diagnosis not present

## 2016-10-21 DIAGNOSIS — K219 Gastro-esophageal reflux disease without esophagitis: Secondary | ICD-10-CM | POA: Diagnosis not present

## 2016-10-21 DIAGNOSIS — Z1231 Encounter for screening mammogram for malignant neoplasm of breast: Secondary | ICD-10-CM

## 2016-10-21 DIAGNOSIS — J45990 Exercise induced bronchospasm: Secondary | ICD-10-CM

## 2016-10-21 DIAGNOSIS — Z1239 Encounter for other screening for malignant neoplasm of breast: Secondary | ICD-10-CM

## 2016-10-21 LAB — POCT URINALYSIS DIPSTICK
BILIRUBIN UA: NEGATIVE
Blood, UA: NEGATIVE
Glucose, UA: NEGATIVE
Leukocytes, UA: NEGATIVE
Nitrite, UA: NEGATIVE
Protein, UA: NEGATIVE
Urobilinogen, UA: 0.2
pH, UA: 5

## 2016-10-21 MED ORDER — ALBUTEROL SULFATE HFA 108 (90 BASE) MCG/ACT IN AERS: 2.0000 | INHALATION_SPRAY | Freq: Four times a day (QID) | RESPIRATORY_TRACT | 3 refills | Status: DC | PRN

## 2016-10-21 NOTE — Progress Notes (Signed)
Date:  10/21/2016   Name:  Kaitlyn Mcdaniel   DOB:  04/25/1971   MRN:  [id number]   Chief Complaint: Annual Exam Kaitlyn Mcdaniel is a 46 y.o. female who presents today for her Complete Annual Exam. She feels fairly well. She reports exercising regularly - PT for knees. She reports she is sleeping well.   Gastroesophageal Reflux  She complains of heartburn and wheezing. She reports no abdominal pain, no chest pain or no coughing. This is a recurrent problem. The problem occurs occasionally. The heartburn is of mild intensity. Pertinent negatives include no fatigue. She has tried a PPI for the symptoms. The treatment provided significant relief.  Asthma  She complains of wheezing. There is no cough or shortness of breath. This is a recurrent problem. The problem occurs intermittently. Associated symptoms include heartburn. Pertinent negatives include no chest pain, fever, headaches or trouble swallowing. Her symptoms are aggravated by exercise and URI. Her past medical history is significant for asthma.    Review of Systems  Constitutional: Positive for unexpected weight change (lost 20 lbs over past months with diet change). Negative for chills, fatigue and fever.  HENT: Negative for congestion, hearing loss, tinnitus, trouble swallowing and voice change.   Eyes: Negative for visual disturbance.  Respiratory: Positive for wheezing. Negative for cough, chest tightness and shortness of breath.   Cardiovascular: Negative for chest pain, palpitations and leg swelling.  Gastrointestinal: Positive for heartburn. Negative for abdominal pain, constipation, diarrhea and vomiting.  Endocrine: Negative for polydipsia and polyuria.  Genitourinary: Negative for dysuria, frequency, genital sores, vaginal bleeding and vaginal discharge.  Musculoskeletal: Positive for arthralgias (but improving since knee surgery). Negative for gait problem and joint swelling.  Skin: Negative for color change and rash.    Neurological: Negative for dizziness, tremors, light-headedness and headaches.  Hematological: Negative for adenopathy. Does not bruise/bleed easily.  Psychiatric/Behavioral: Negative for dysphoric mood and sleep disturbance. The patient is not nervous/anxious.     Patient Active Problem List   Diagnosis Date Noted  . 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 Status post total knee replacement, left 09/02/2016  . Elevated blood sugar 09/05/2015  . OSA (obstructive sleep apnea) 08/06/2015  . Exercise-induced asthma 04/29/2015  . GERD without esophagitis 04/29/2015  . Hx of abnormal cervical Papanicolaou smear 04/29/2015  . Degenerative arthritis of knee, bilateral 03/08/2015    Prior to Admission medications   Medication Sig Start Date End Date Taking? Authorizing Provider  albuterol (PROVENTIL HFA;VENTOLIN HFA) 108 (90 BASE) MCG/ACT inhaler Inhale 2 puffs into the lungs every 6 (six) hours as needed for wheezing or shortness of breath. 06/08/15  Yes Glean Hess, MD  celecoxib (CELEBREX) 200 MG capsule Take 200 mg by mouth 2 (two) times daily.   Yes Historical Provider, MD  ibuprofen (ADVIL,MOTRIN) 800 MG tablet Take 1 tablet (800 mg total) by mouth 3 (three) times daily. 04/29/15  Yes Olen Cordial, NP  omeprazole (PRILOSEC) 20 MG capsule Take 1 capsule (20 mg total) by mouth 2 (two) times daily before a meal. 10/16/15  Yes Glean Hess, MD  temazepam (RESTORIL) 15 MG capsule TAKE 1 CAPSULE BY MOUTH ONCE DAILY AT BEDTIME AS NEEDED FOR SLEEP 06/06/16  Yes Glean Hess, MD  UNABLE TO FIND CPAP nite time   Yes Historical Provider, MD    No Known Allergies  Past Surgical History:  Procedure Laterality Date  . BREAST REDUCTION SURGERY  1999  . CERVIX LESION  DESTRUCTION  1998   abnormal PAP  . REDUCTION MAMMAPLASTY Bilateral 1999  . TOTAL KNEE ARTHROPLASTY Left 06/2016  . TOTAL KNEE ARTHROPLASTY Right 09/2016    Social History  Substance Use Topics  .  Smoking status: Never Smoker  . Smokeless tobacco: Never Used  . Alcohol use 0.6 oz/week    1 Glasses of wine per week     Medication list has been reviewed and updated.   Physical Exam  Constitutional: She is oriented to person, place, and time. She appears well-developed and well-nourished. No distress.  HENT:  Head: Normocephalic and atraumatic.  Right Ear: Tympanic membrane and ear canal normal.  Left Ear: Tympanic membrane and ear canal normal.  Nose: Right sinus exhibits no maxillary sinus tenderness. Left sinus exhibits no maxillary sinus tenderness.  Mouth/Throat: Uvula is midline and oropharynx is clear and moist.  Eyes: Conjunctivae and EOM are normal. Right eye exhibits no discharge. Left eye exhibits no discharge. No scleral icterus.  Neck: Normal range of motion. Carotid bruit is not present. No erythema present. No thyromegaly present.  Cardiovascular: Normal rate, regular rhythm, normal heart sounds and normal pulses.   Pulmonary/Chest: Effort normal. No respiratory distress. She has no wheezes. Right breast exhibits no mass, no nipple discharge, no skin change and no tenderness. Left breast exhibits no mass, no nipple discharge, no skin change and no tenderness.  Abdominal: Soft. Bowel sounds are normal. There is no hepatosplenomegaly. There is no tenderness. There is no CVA tenderness.  Musculoskeletal: She exhibits no edema.  Lymphadenopathy:    She has no cervical adenopathy.    She has no axillary adenopathy.  Neurological: She is alert and oriented to person, place, and time. She has normal reflexes. No cranial nerve deficit or sensory deficit.  Skin: Skin is warm, dry and intact. No rash noted.  Psychiatric: She has a normal mood and affect. Her speech is normal and behavior is normal. Thought content normal.  Nursing note and vitals reviewed.   BP 130/86   Pulse 85   Temp 97.7 F (36.5 C)   Ht 5\' 3"  (1.6 m)   Wt 252 lb (114.3 kg)   SpO2 100%   BMI 44.64  kg/m   Assessment and Plan: 1. Annual physical exam Pap normal last year Doing well s/p bilateral TKR - Lipid panel - TSH - POCT urinalysis dipstick  2. Breast cancer screening Schedule at [place]; Future  3. Exercise-induced asthma Intermittent, stable - albuterol (PROVENTIL HFA;VENTOLIN HFA) 108 (90 Base) MCG/ACT inhaler; Inhale 2 puffs into the lungs every 6 (six) hours as needed for wheezing or shortness of breath.  Dispense: 18 g; Refill: 3  4. GERD without esophagitis Controlled with PPI - CBC with Differential/Platelet  5. Elevated blood sugar Continue diet and weight loss - Comprehensive metabolic panel - Hemoglobin A1c   Halina Maidens, MD [facility]  10/21/2016

## 2016-10-22 ENCOUNTER — Ambulatory Visit: Payer: 59 | Admitting: Physical Therapy

## 2016-10-22 DIAGNOSIS — M6281 Muscle weakness (generalized): Secondary | ICD-10-CM

## 2016-10-22 DIAGNOSIS — M25661 Stiffness of right knee, not elsewhere classified: Secondary | ICD-10-CM

## 2016-10-22 DIAGNOSIS — M25561 Pain in right knee: Secondary | ICD-10-CM

## 2016-10-22 DIAGNOSIS — R262 Difficulty in walking, not elsewhere classified: Secondary | ICD-10-CM

## 2016-10-22 LAB — COMPREHENSIVE METABOLIC PANEL
ALBUMIN: 4.5 g/dL (ref 3.5–5.5)
ALT: 12 IU/L (ref 0–32)
AST: 12 IU/L (ref 0–40)
Albumin/Globulin Ratio: 1.6 (ref 1.2–2.2)
Alkaline Phosphatase: 105 IU/L (ref 39–117)
BUN/Creatinine Ratio: 27 — ABNORMAL HIGH (ref 9–23)
BUN: 21 mg/dL (ref 6–24)
Bilirubin Total: 0.3 mg/dL (ref 0.0–1.2)
CALCIUM: 9.6 mg/dL (ref 8.7–10.2)
CO2: 20 mmol/L (ref 18–29)
CREATININE: 0.78 mg/dL (ref 0.57–1.00)
Chloride: 97 mmol/L (ref 96–106)
GFR calc Af Amer: 105 mL/min/{1.73_m2} (ref >59)
GFR, EST NON AFRICAN AMERICAN: 91 mL/min/{1.73_m2} (ref >59)
GLOBULIN, TOTAL: 2.9 g/dL (ref 1.5–4.5)
GLUCOSE: 102 mg/dL — AB (ref 65–99)
Potassium: 4.2 mmol/L (ref 3.5–5.2)
SODIUM: 139 mmol/L (ref 134–144)
Total Protein: 7.4 g/dL (ref 6.0–8.5)

## 2016-10-22 LAB — HEMOGLOBIN A1C
ESTIMATED AVERAGE GLUCOSE: 114 mg/dL
HEMOGLOBIN A1C: 5.6 % (ref 4.8–5.6)

## 2016-10-22 LAB — CBC WITH DIFFERENTIAL/PLATELET
BASOS ABS: 0 10*3/uL (ref 0.0–0.2)
Basos: 0 %
EOS (ABSOLUTE): 0.6 10*3/uL — ABNORMAL HIGH (ref 0.0–0.4)
EOS: 9 %
HEMATOCRIT: 37 % (ref 34.0–46.6)
HEMOGLOBIN: 11.7 g/dL (ref 11.1–15.9)
IMMATURE GRANULOCYTES: 0 %
Immature Grans (Abs): 0 10*3/uL (ref 0.0–0.1)
Lymphocytes Absolute: 1.4 10*3/uL (ref 0.7–3.1)
Lymphs: 20 %
MCH: 23.6 pg — ABNORMAL LOW (ref 26.6–33.0)
MCHC: 31.6 g/dL (ref 31.5–35.7)
MCV: 75 fL — ABNORMAL LOW (ref 79–97)
MONOCYTES: 7 %
Monocytes Absolute: 0.5 10*3/uL (ref 0.1–0.9)
NEUTROS PCT: 64 %
Neutrophils Absolute: 4.5 10*3/uL (ref 1.4–7.0)
Platelets: 289 10*3/uL (ref 150–379)
RBC: 4.96 x10E6/uL (ref 3.77–5.28)
RDW: 15.7 % — AB (ref 12.3–15.4)
WBC: 7 10*3/uL (ref 3.4–10.8)

## 2016-10-22 LAB — LIPID PANEL
CHOL/HDL RATIO: 4.5 ratio — AB (ref 0.0–4.4)
Cholesterol, Total: 244 mg/dL — ABNORMAL HIGH (ref 100–199)
HDL: 54 mg/dL (ref >39)
LDL Calculated: 164 mg/dL — ABNORMAL HIGH (ref 0–99)
Triglycerides: 128 mg/dL (ref 0–149)
VLDL Cholesterol Cal: 26 mg/dL (ref 5–40)

## 2016-10-22 LAB — TSH: TSH: 0.945 u[IU]/mL (ref 0.450–4.500)

## 2016-10-22 NOTE — Therapy (Signed)
[facility] [address] [place], [place], [address] Phone: [phone]   Fax:  [phone]  Physical Therapy Treatment  Patient Details  Name: Kaitlyn Mcdaniel MRN: [id number] Date of Birth: April 12, 1971 Referring Provider: Dr. Jones Bales.   Encounter Date: 10/22/2016      PT End of Session - 10/22/16 1453    Visit Number 13   Number of Visits 18   Date for PT Re-Evaluation 11/11/16   PT Start Time 1448   PT Stop Time 1549   PT Time Calculation (min) 61 min   Activity Tolerance Patient tolerated treatment well;Patient limited by pain   Behavior During Therapy [facility] for tasks assessed/performed      No past medical history on file.  Past Surgical History:  Procedure Laterality Date  . BREAST REDUCTION SURGERY  1999  . CERVIX LESION DESTRUCTION  1998   abnormal PAP  . REDUCTION MAMMAPLASTY Bilateral 1999  . TOTAL KNEE ARTHROPLASTY Left 06/2016  . TOTAL KNEE ARTHROPLASTY Right 09/2016    There were no vitals filed for this visit.      Subjective Assessment - 10/22/16 1450    Subjective Patient is good, reports being a little tired and taking a while to get from sitting to standing.  Pt. has returned to work with limited hours for the first couple of weeks.  Patient has been cleared for driving but was driven to PT today by her daughter so she would not have to walk in the rain from the parking lot.  It is still taking a while to go to sleep and the patient reports having a twinge last night in her knee.    Patient is accompained by: Family member   Pertinent History 19 stairs at home and has been able to complete since surgery.     Limitations Lifting;Standing;Sitting;Walking;House hold activities;Other (comment)   Patient Stated Goals Increase R knee AROM/ strength to improve mobility/ return to work.     Currently in Pain? Yes   Pain Score 3    Pain Location Knee   Pain Orientation Right   Pain Descriptors /  Indicators Aching   Pain Type Surgical pain     TherEx : NuStep Lvl 6 10 minutes LE (no charge) Bosu Clocks (12,3,6,9) clockwise & counterclockwise in // bars no UE support Nautilus resisted walking: backwards 70 lbs x 10, R side steps 60lb x10, L side step 60 lb x10, forwards 60 lbx10 Good posture, control of body mechanics, and rhythm Airex bilateral heel raises in // bars. 2x10 with occasional forward LOB.  Standing calf stretch against parallel bars, both sides x20 seconds SLS: L:30 seconds, R: trunk sway ~10-15 seconds Crane step ups with LLE & RLE onto 6' . Occasional forward LOB. Required cues for core activation Eccentric heel touches on 3" multiple trials  Ambulating between activities to demonstrate carryover.   Manual  Supine PROM R knee flexion 122 degrees, extension 5x with hold Supine AAROM/AROM R knee flexion/ext, heel slides.  Single leg raise RLE [facility] R Patellar mobilization, good medial, lateral, superior and posterior translation, (4 min.). RLE quadricep STM    Pt response for medical necessity: Benefits from progression of R knee ROM/ stability to improve pain-free mobility and progression to independent ambulation. Pt. Ambulating with improved gait/ confidence around PT clinic        PT Long Term Goals - 10/15/16 1837      PT LONG TERM GOAL #1  Title Pt. independent with HEP to increase R knee AROM (0 to 115 deg.) to improve pain-free mobility.     Baseline -3 to 113 deg. R knee AAROM   Time 4   Period Weeks   Status Partially Met     PT LONG TERM GOAL #2   Title Pt. will increase LEFS to >40 out of 80 to improve pain-free mobility/ return to work.     Baseline LEFS: 35 out of 80 on 10/03/16    Time 4   Period Weeks   Status On-going     PT LONG TERM GOAL #3   Title Pt. able to ambulate community distance with normalized gait pattern with least assistive device safely to improve daily mobility.    Baseline Improving gait pattern without use of SPC  on level surfaces.  Pt. recommends SPC with uneven/ outside terrain.    Time 4   Period Weeks   Status Partially Met     PT LONG TERM GOAL #4   Title Pt. able to ascend/descend 19 stairs at home with recip. pattern and no UE assist safety to improve ability to get to 2nd floor bedroom.     Baseline Recip. gait with ascending/ descending.   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     Baseline currently out of work   Time 4   Period Weeks   Status On-going            Plan - 10/22/16 1505    Clinical Impression Statement Pt. presents to physical therapy session today after returning to week for the first time and has a little swelling from being on feet at work. Doctor has released patient from all precautions such as driving. Patient has good control of body mechanics with dynamic balance activities such as clocks on Bosu ball. Patient's gait continues to normalize with less noted deviations with slightly less weight bearing on RLE with improved bilateral LE strengthening. Patient was able to obtain R knee AAROM/PROM of 122 degrees. Right knee eccentric and concentric strength and control has progressed and will benefit from continued functional strengthening. Patient would benefit from continued skilled physical therapy to strengthen RLE, improved balance, functional capacity for activities, and return to work activities.    Rehab Potential Good   PT Frequency 1x / week   PT Duration 4 weeks   PT Treatment/Interventions ADLs/Self Care Home Management;Aquatic Therapy;Cryotherapy;Electrical Stimulation;Gait training;Stair training;Functional mobility training;Therapeutic activities;Therapeutic exercise;Patient/family education;Neuromuscular re-education;Balance training;Manual techniques;Passive range of motion;Scar mobilization   PT Next Visit Plan establish leg  strengthening HEP   PT Home Exercise Plan See handouts, as prescribed   Consulted and Agree with Plan of Care Patient      Patient will benefit from skilled therapeutic intervention in order to improve the following deficits and impairments:  Abnormal gait, Improper body mechanics, Pain, Postural dysfunction, Decreased mobility, Decreased activity tolerance, Decreased endurance, Decreased range of motion, Decreased strength, Hypomobility, Impaired flexibility, Difficulty walking, Decreased safety awareness  Visit Diagnosis: Acute pain of right knee  Joint stiffness of knee, right  Muscle weakness (generalized)  Difficulty in walking, not elsewhere classified     Problem List Patient Active Problem List   Diagnosis Date Noted  . 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 Status post total knee replacement, left 09/02/2016  .  Elevated blood sugar 09/05/2015  . OSA (obstructive sleep apnea) 08/06/2015  . Exercise-induced asthma 04/29/2015  . GERD without esophagitis 04/29/2015  . Hx of abnormal cervical Papanicolaou smear 04/29/2015  . Degenerative arthritis of knee, bilateral 03/08/2015   Pura Spice, PT, DPT # [id number] Willodean Rosenthal, SPT 10/23/2016, 9:41 AM  [facility] [address] [place], [place], [address] Phone: [phone]   Fax:  [phone]  Name: Kaitlyn Mcdaniel MRN: [id number] Date of Birth: 1971/04/30

## 2016-10-29 ENCOUNTER — Ambulatory Visit: Payer: 59 | Admitting: Physical Therapy

## 2016-10-29 DIAGNOSIS — R262 Difficulty in walking, not elsewhere classified: Secondary | ICD-10-CM | POA: Diagnosis not present

## 2016-10-29 DIAGNOSIS — M25561 Pain in right knee: Secondary | ICD-10-CM

## 2016-10-29 DIAGNOSIS — M6281 Muscle weakness (generalized): Secondary | ICD-10-CM | POA: Diagnosis not present

## 2016-10-29 DIAGNOSIS — M25661 Stiffness of right knee, not elsewhere classified: Secondary | ICD-10-CM

## 2016-10-29 NOTE — Therapy (Signed)
[facility] [address] [place], [place], [address] Phone: [phone]   Fax:  [phone]  Physical Therapy Treatment  Patient Details  Name: Kaitlyn Mcdaniel MRN: [id number] Date of Birth: [birth year] Referring Provider: Dr. Jones Bales.   Encounter Date: 10/29/2016      PT End of Session - 10/29/16 1521    Visit Number 14   Number of Visits 18   Date for PT Re-Evaluation 11/11/16   PT Start Time 8850   PT Stop Time 1542   PT Time Calculation (min) 55 min   Activity Tolerance Patient tolerated treatment well;Patient limited by pain   Behavior During Therapy [facility] for tasks assessed/performed      No past medical history on file.  Past Surgical History:  Procedure Laterality Date  . BREAST REDUCTION SURGERY  1999  . CERVIX LESION DESTRUCTION  1998   abnormal PAP  . REDUCTION MAMMAPLASTY Bilateral 1999  . TOTAL KNEE ARTHROPLASTY Left 06/2016  . TOTAL KNEE ARTHROPLASTY Right 09/2016    There were no vitals filed for this visit.      Subjective Assessment - 10/29/16 1519    Subjective Pt reports the past two days she has returned to work full schedule. Patient is a little sore and tired but is driving herself more places now. Patient reports increased bilateral swelling and knots in BLE.    Patient is accompained by: Family member   Pertinent History 19 stairs at home and has been able to complete since surgery.     Limitations Lifting;Standing;Sitting;Walking;House hold activities;Other (comment)   Patient Stated Goals Increase R knee AROM/ strength to improve mobility/ return to work.     Currently in Pain? No/denies      OBJECTIVE:  Ther.ex.: Nustep L6 Seat 6 10 min B UE/LE. Monster walks without band 1x length of hopscotch. Monster walks with RTB 1x length of hopscotch.Goblet squats with 5lb box 2x10. Verbal cues for weight bearing through heels. Good form w/ squat with cuing to remind pt. Of alignment of  knees over toes. Walking lunges w/ core rotation, good quad control and no increased pain.    Neuro:Green balance disk PF and DF 1x15. Single limb balance. Eccentric step downs from 6" step. Pt. Progressed from 3" step last tx session and was able to complete Single leg ball rebounds, pt able to complete 10 on L leg without LOB, pt completed 3 without LOB standing on RLE. Will continue to progress dynamic balance next tx session.      Pt response for medical necessity: Benefits from progression of R knee ROM/ stability to improve pain-free mobility and progression to independent ambulation. Pt. Ambulating with improved gait/ confidence around PT clinic and at work.          PT Long Term Goals - 10/15/16 1837      PT LONG TERM GOAL #1   Title Pt. independent with HEP to increase R knee AROM (0 to 115 deg.) to improve pain-free mobility.     Baseline -3 to 113 deg. R knee AAROM   Time 4   Period Weeks   Status Partially Met     PT LONG TERM GOAL #2   Title Pt. will increase LEFS to >40 out of 80 to improve pain-free mobility/ return to work.     Baseline LEFS: 35 out of 80 on 10/03/16    Time 4   Period Weeks   Status On-going  PT LONG TERM GOAL #3   Title Pt. able to ambulate community distance with normalized gait pattern with least assistive device safely to improve daily mobility.    Baseline Improving gait pattern without use of SPC on level surfaces.  Pt. recommends SPC with uneven/ outside terrain.    Time 4   Period Weeks   Status Partially Met     PT LONG TERM GOAL #4   Title Pt. able to ascend/descend 19 stairs at home with recip. pattern and no UE assist safety to improve ability to get to 2nd floor bedroom.     Baseline Recip. gait with ascending/ descending.   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     Baseline currently out of work   Time 4    Period Weeks   Status On-going           Plan - 10/29/16 1543    Clinical Impression Statement Pt. presented to PT clinic today continuing to ambulate independently without use of SPC. Pt. has returned to full-time work schedule and is experiencing soreness and muscle tightness in BLEs. Pt. is progressing through standing length strengthening exercises and is able to complete tasks such as walking lunges with dynamic core rotation without any loss of balance. Pt is not limited by pain with any strengthening and is demonstrating better eccentric control of R quad during step-downs from 6" step this week. Pt. will benefit from continued skilled PT to increase strength of R LE to improve stability and reduce muscle fatigue during return to work.    Rehab Potential Good   PT Frequency 1x / week   PT Duration 4 weeks   PT Treatment/Interventions ADLs/Self Care Home Management;Aquatic Therapy;Cryotherapy;Electrical Stimulation;Gait training;Stair training;Functional mobility training;Therapeutic activities;Therapeutic exercise;Patient/family education;Neuromuscular re-education;Balance training;Manual techniques;Passive range of motion;Scar mobilization   PT Next Visit Plan establish leg strengthening HEP   PT Home Exercise Plan See handouts, as prescribed   Consulted and Agree with Plan of Care Patient      Patient will benefit from skilled therapeutic intervention in order to improve the following deficits and impairments:  Abnormal gait, Improper body mechanics, Pain, Postural dysfunction, Decreased mobility, Decreased activity tolerance, Decreased endurance, Decreased range of motion, Decreased strength, Hypomobility, Impaired flexibility, Difficulty walking, Decreased safety awareness  Visit Diagnosis: Acute pain of right knee  Joint stiffness of knee, right  Muscle weakness (generalized)  Difficulty in walking, not elsewhere classified     Problem List Patient Active Problem  List   Diagnosis Date Noted  . 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 Status post total knee replacement, left 09/02/2016  . Elevated blood sugar 09/05/2015  . OSA (obstructive sleep apnea) 08/06/2015  . Exercise-induced asthma 04/29/2015  . GERD without esophagitis 04/29/2015  . Hx of abnormal cervical Papanicolaou smear 04/29/2015  . Degenerative arthritis of knee, bilateral 03/08/2015   Pura Spice, PT, DPT # [id number] SPT 10/30/2016, 7:54 AM   [facility] [address] [place], [place], [address] Phone: [phone]   Fax:  [phone]  Name: DWANA GARIN MRN: [id number] Date of Birth: 16-Dec-1970

## 2016-11-05 ENCOUNTER — Ambulatory Visit: Payer: 59 | Admitting: Physical Therapy

## 2016-11-05 DIAGNOSIS — R262 Difficulty in walking, not elsewhere classified: Secondary | ICD-10-CM | POA: Diagnosis not present

## 2016-11-05 DIAGNOSIS — M25561 Pain in right knee: Secondary | ICD-10-CM

## 2016-11-05 DIAGNOSIS — M25661 Stiffness of right knee, not elsewhere classified: Secondary | ICD-10-CM | POA: Diagnosis not present

## 2016-11-05 DIAGNOSIS — M6281 Muscle weakness (generalized): Secondary | ICD-10-CM

## 2016-11-07 NOTE — Therapy (Signed)
[facility] [address] [place], [place], [address] Phone: [phone]   Fax:  [phone]  Physical Therapy Treatment  Patient Details  Name: DANISSA VANDEPOL MRN: [id number] Date of Birth: 1971-07-08 Referring Provider: Dr. Jones Bales.   Encounter Date: 11/05/2016      PT End of Session - 11/07/16 1119    Visit Number 15   Number of Visits 18   Date for PT Re-Evaluation 11/11/16   PT Start Time 1455   PT Stop Time 1544   PT Time Calculation (min) 49 min   Activity Tolerance Patient tolerated treatment well   Behavior During Therapy [facility] for tasks assessed/performed      No past medical history on file.  Past Surgical History:  Procedure Laterality Date  . BREAST REDUCTION SURGERY  1999  . CERVIX LESION DESTRUCTION  1998   abnormal PAP  . REDUCTION MAMMAPLASTY Bilateral 1999  . TOTAL KNEE ARTHROPLASTY Left 06/2016  . TOTAL KNEE ARTHROPLASTY Right 09/2016    There were no vitals filed for this visit.      Subjective Assessment - 11/06/16 1354    Subjective Pt. reports moderate B quad soreness after last tx. session.  Pt. states she has been doing well at work with no limitations reported.     Patient is accompained by: Family member   Pertinent History 19 stairs at home and has been able to complete since surgery.     Limitations Lifting;Standing;Sitting;Walking;House hold activities;Other (comment)   Patient Stated Goals Increase R knee AROM/ strength to improve mobility/ return to work.     Currently in Pain? No/denies      Discussed HEP/ daily activity.  Manual tx.:  Supine A/AROM L and R knee flexion 5x each.  Patellar mobs. All planes.  STM from distal to proximal for pain control/ decrease swelling.  Good proximal tibia mobility.        PT Long Term Goals - 11/07/16 1124      PT LONG TERM GOAL #1   Title Pt. independent with HEP to increase R knee AROM (0 to 115 deg.) to improve pain-free  mobility.     Baseline see above   Time 4   Period Weeks   Status Achieved     PT LONG TERM GOAL #2   Title Pt. will increase LEFS to >40 out of 80 to improve pain-free mobility/ return to work.     Baseline LEFS: 57 out of 80 on 11/07/16    Time 4   Period Weeks   Status Achieved     PT LONG TERM GOAL #3   Title Pt. able to ambulate community distance with normalized gait pattern with least assistive device safely to improve daily mobility.    Baseline Normalized gait with no assistive device   Time 4   Period Weeks   Status Achieved     PT LONG TERM GOAL #4   Title Pt. able to ascend/descend 19 stairs at home with recip. pattern and no UE assist safety to improve ability to get to 2nd floor bedroom.     Baseline Recip. gait with ascending/ descending.   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     Time 4   Period Weeks   Status Achieved  Plan - 11/07/16 1120    Clinical Impression Statement Pt. has progressed well towards all PT goals.  L knee AROM: 127 deg.  R knee AROM: 119 deg.  Good patellar mobility and B LE muscle strength grossly 5/5 MMT.  Pt. will continue to benefit from consistent walking/ HEP to maintain functional gains/ mobility.     Rehab Potential Good   PT Frequency 1x / week   PT Duration 4 weeks   PT Treatment/Interventions ADLs/Self Care Home Management;Aquatic Therapy;Cryotherapy;Electrical Stimulation;Gait training;Stair training;Functional mobility training;Therapeutic activities;Therapeutic exercise;Patient/family education;Neuromuscular re-education;Balance training;Manual techniques;Passive range of motion;Scar mobilization   PT Next Visit Plan Discharge   PT Home Exercise Plan See handouts, as prescribed   Consulted and Agree with Plan of Care Patient      Patient will benefit from skilled therapeutic  intervention in order to improve the following deficits and impairments:  Abnormal gait, Improper body mechanics, Pain, Postural dysfunction, Decreased mobility, Decreased activity tolerance, Decreased endurance, Decreased range of motion, Decreased strength, Hypomobility, Impaired flexibility, Difficulty walking, Decreased safety awareness  Visit Diagnosis: Acute pain of right knee  Joint stiffness of knee, right  Muscle weakness (generalized)  Difficulty in walking, not elsewhere classified     Problem List Patient Active Problem List   Diagnosis Date Noted  . 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 Status post total knee replacement, left 09/02/2016  . Elevated blood sugar 09/05/2015  . OSA (obstructive sleep apnea) 08/06/2015  . Exercise-induced asthma 04/29/2015  . GERD without esophagitis 04/29/2015  . Hx of abnormal cervical Papanicolaou smear 04/29/2015  . Degenerative arthritis of knee, bilateral 03/08/2015   Pura Spice, PT, DPT # [phone] 11/07/2016, 11:27 AM  [facility] [address] [place], [place], [address] Phone: [phone]   Fax:  [phone]  Name: LETIZIA BOXLEY MRN: [id number] Date of Birth: 24-Sep-1970

## 2016-11-26 ENCOUNTER — Ambulatory Visit
Admission: RE | Admit: 2016-11-26 | Discharge: 2016-11-26 | Disposition: A | Discharge status: Home / Self Care | Payer: 59 | Source: Ambulatory Visit | Attending: Internal Medicine | Admitting: Internal Medicine

## 2016-11-26 DIAGNOSIS — Z1231 Encounter for screening mammogram for malignant neoplasm of breast: Secondary | ICD-10-CM | POA: Diagnosis not present

## 2016-11-26 DIAGNOSIS — Z1239 Encounter for other screening for malignant neoplasm of breast: Secondary | ICD-10-CM

## 2017-01-18 ENCOUNTER — Encounter: Payer: Self-pay | Admitting: Internal Medicine

## 2017-01-19 ENCOUNTER — Other Ambulatory Visit: Payer: Self-pay

## 2017-01-19 DIAGNOSIS — K219 Gastro-esophageal reflux disease without esophagitis: Secondary | ICD-10-CM

## 2017-01-19 MED ORDER — OMEPRAZOLE 20 MG PO CPDR: 20.0000 mg | DELAYED_RELEASE_CAPSULE | Freq: Two times a day (BID) | ORAL | 3 refills | Status: DC

## 2017-01-21 DIAGNOSIS — G4733 Obstructive sleep apnea (adult) (pediatric): Secondary | ICD-10-CM | POA: Diagnosis not present

## 2017-01-29 DIAGNOSIS — Z96652 Presence of left artificial knee joint: Secondary | ICD-10-CM | POA: Diagnosis not present

## 2017-01-29 DIAGNOSIS — M1711 Unilateral primary osteoarthritis, right knee: Secondary | ICD-10-CM | POA: Diagnosis not present

## 2017-01-29 DIAGNOSIS — Z96651 Presence of right artificial knee joint: Secondary | ICD-10-CM | POA: Diagnosis not present

## 2017-05-21 DIAGNOSIS — H524 Presbyopia: Secondary | ICD-10-CM | POA: Diagnosis not present

## 2017-05-21 DIAGNOSIS — H5201 Hypermetropia, right eye: Secondary | ICD-10-CM | POA: Diagnosis not present

## 2017-05-21 DIAGNOSIS — H5212 Myopia, left eye: Secondary | ICD-10-CM | POA: Diagnosis not present

## 2017-05-21 DIAGNOSIS — H04123 Dry eye syndrome of bilateral lacrimal glands: Secondary | ICD-10-CM | POA: Diagnosis not present

## 2017-05-21 DIAGNOSIS — H52223 Regular astigmatism, bilateral: Secondary | ICD-10-CM | POA: Diagnosis not present

## 2017-07-21 DIAGNOSIS — Z96652 Presence of left artificial knee joint: Secondary | ICD-10-CM | POA: Diagnosis not present

## 2017-07-21 DIAGNOSIS — Z96653 Presence of artificial knee joint, bilateral: Secondary | ICD-10-CM | POA: Diagnosis not present

## 2017-07-21 DIAGNOSIS — Z96651 Presence of right artificial knee joint: Secondary | ICD-10-CM | POA: Diagnosis not present

## 2017-10-13 ENCOUNTER — Ambulatory Visit (INDEPENDENT_AMBULATORY_CARE_PROVIDER_SITE_OTHER): Payer: No Typology Code available for payment source | Admitting: Internal Medicine

## 2017-10-13 ENCOUNTER — Encounter: Payer: Self-pay | Admitting: Internal Medicine

## 2017-10-13 VITALS — BP 120/84 | HR 69 | Temp 98.2°F | Ht 63.0 in | Wt 273.0 lb

## 2017-10-13 DIAGNOSIS — J40 Bronchitis, not specified as acute or chronic: Secondary | ICD-10-CM

## 2017-10-13 MED ORDER — BENZONATATE 100 MG PO CAPS: 100.0000 mg | ORAL_CAPSULE | Freq: Three times a day (TID) | ORAL | 0 refills | Status: DC

## 2017-10-13 MED ORDER — DOXYCYCLINE HYCLATE 100 MG PO TABS: 100.0000 mg | ORAL_TABLET | Freq: Two times a day (BID) | ORAL | 0 refills | Status: AC

## 2017-10-13 NOTE — Progress Notes (Signed)
Date:  10/13/2017   Name:  Kaitlyn Mcdaniel   DOB:  19-Dec-1970   MRN:  [id number]   Chief Complaint: Cough (X 2 weeks. No mucous coming up. No fever noticed. )  Cough  This is a new problem. The current episode started 1 to 4 weeks ago. The problem has been unchanged. The problem occurs every few minutes. The cough is non-productive. Associated symptoms include shortness of breath and wheezing. Pertinent negatives include no chest pain, chills, ear pain, fever or sore throat. There is no history of environmental allergies.     Review of Systems  Constitutional: Negative for chills and fever.  HENT: Negative for ear pain and sore throat.   Eyes: Negative for visual disturbance.  Respiratory: Positive for cough (disturbing her during the day), shortness of breath and wheezing.   Cardiovascular: Negative for chest pain.  Allergic/Immunologic: Negative for environmental allergies.  Psychiatric/Behavioral: Negative for sleep disturbance (using nyquil at night).    Patient Active Problem List   Diagnosis Date Noted  . 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 Status post total knee replacement, left 09/02/2016  . Elevated blood sugar 09/05/2015  . OSA (obstructive sleep apnea) 08/06/2015  . Exercise-induced asthma 04/29/2015  . GERD without esophagitis 04/29/2015  . Hx of abnormal cervical Papanicolaou smear 04/29/2015  . Degenerative arthritis of knee, bilateral 03/08/2015    Prior to Admission medications   Medication Sig Start Date End Date Taking? Authorizing Provider  albuterol (PROVENTIL HFA;VENTOLIN HFA) 108 (90 Base) MCG/ACT inhaler Inhale 2 puffs into the lungs every 6 (six) hours as needed for wheezing or shortness of breath. 10/21/16  Yes Glean Hess, MD  celecoxib (CELEBREX) 200 MG capsule Take 200 mg by mouth 2 (two) times daily.   Yes [provider]  ibuprofen (ADVIL,MOTRIN) 800 MG tablet Take 1 tablet (800 mg total) by mouth 3 (three)  times daily. 04/29/15  Yes Betancourt, Aura Fey, NP  omeprazole (PRILOSEC) 20 MG capsule Take 1 capsule (20 mg total) by mouth 2 (two) times daily before a meal. 01/19/17  Yes Glean Hess, MD  temazepam (RESTORIL) 15 MG capsule TAKE 1 CAPSULE BY MOUTH ONCE DAILY AT BEDTIME AS NEEDED FOR SLEEP 06/06/16  Yes Glean Hess, MD  UNABLE TO FIND CPAP nite time   Yes [provider]    No Known Allergies  Past Surgical History:  Procedure Laterality Date  . BREAST REDUCTION SURGERY  1999  . CERVIX LESION DESTRUCTION  1998   abnormal PAP  . REDUCTION MAMMAPLASTY Bilateral 1999  . TOTAL KNEE ARTHROPLASTY Left 06/2016  . TOTAL KNEE ARTHROPLASTY Right 09/2016    Social History   Tobacco Use  . Smoking status: Never Smoker  . Smokeless tobacco: Never Used  Substance Use Topics  . Alcohol use: Yes    Alcohol/week: 0.6 oz    Types: 1 Glasses of wine per week  . Drug use: No     Medication list has been reviewed and updated.  PHQ 2/9 Scores 10/21/2016  PHQ - 2 Score 0    Physical Exam  Constitutional: She is oriented to person, place, and time. She appears well-developed. No distress.  HENT:  Head: Normocephalic and atraumatic.  Right Ear: Ear canal normal. A middle ear effusion is present.  Left Ear: Ear canal normal. A middle ear effusion is present.  Mouth/Throat: No posterior oropharyngeal edema or posterior oropharyngeal erythema.  Neck: Normal range of motion. Neck supple. No thyromegaly  present.  Cardiovascular: Normal rate, regular rhythm and normal heart sounds.  Pulmonary/Chest: Breath sounds normal. No respiratory distress (course upper lung BS). She has no wheezes.  Musculoskeletal: Normal range of motion.  Neurological: She is alert and oriented to person, place, and time.  Skin: Skin is warm and dry. No rash noted.  Psychiatric: She has a normal mood and affect. Her behavior is normal. Thought content normal.  Nursing note and vitals reviewed.   BP  120/84   Pulse 69   Temp 98.2 F (36.8 C) (Oral)   Ht 5\' 3"  (1.6 m)   Wt 273 lb (123.8 kg)   SpO2 100%   BMI 48.36 kg/m   Assessment and Plan: 1. Bronchitis Use MDI as needed Continue Nyquil at night - doxycycline (VIBRA-TABS) 100 MG tablet; Take 1 tablet (100 mg total) by mouth 2 (two) times daily for 10 days.  Dispense: 20 tablet; Refill: 0 - benzonatate (TESSALON) 100 MG capsule; Take 1 capsule (100 mg total) by mouth 3 (three) times daily.  Dispense: 30 capsule; Refill: 0   Meds ordered this encounter  Medications  . doxycycline (VIBRA-TABS) 100 MG tablet    Sig: Take 1 tablet (100 mg total) by mouth 2 (two) times daily for 10 days.    Dispense:  20 tablet    Refill:  0  . benzonatate (TESSALON) 100 MG capsule    Sig: Take 1 capsule (100 mg total) by mouth 3 (three) times daily.    Dispense:  30 capsule    Refill:  0    Partially dictated using Editor, commissioning. Any errors are unintentional.  Halina Maidens, MD [facility]  10/13/2017

## 2018-02-24 ENCOUNTER — Encounter: Payer: Self-pay | Admitting: Internal Medicine

## 2018-02-24 ENCOUNTER — Ambulatory Visit (INDEPENDENT_AMBULATORY_CARE_PROVIDER_SITE_OTHER): Payer: No Typology Code available for payment source | Admitting: Internal Medicine

## 2018-02-24 VITALS — BP 110/60 | HR 65 | Temp 97.8°F | Resp 16 | Ht 63.5 in | Wt 272.0 lb

## 2018-02-24 DIAGNOSIS — J45990 Exercise induced bronchospasm: Secondary | ICD-10-CM | POA: Diagnosis not present

## 2018-02-24 DIAGNOSIS — K219 Gastro-esophageal reflux disease without esophagitis: Secondary | ICD-10-CM

## 2018-02-24 DIAGNOSIS — R739 Hyperglycemia, unspecified: Secondary | ICD-10-CM

## 2018-02-24 DIAGNOSIS — Z0001 Encounter for general adult medical examination with abnormal findings: Secondary | ICD-10-CM | POA: Diagnosis not present

## 2018-02-24 DIAGNOSIS — E1169 Type 2 diabetes mellitus with other specified complication: Secondary | ICD-10-CM | POA: Insufficient documentation

## 2018-02-24 DIAGNOSIS — G4733 Obstructive sleep apnea (adult) (pediatric): Secondary | ICD-10-CM | POA: Diagnosis not present

## 2018-02-24 DIAGNOSIS — E782 Mixed hyperlipidemia: Secondary | ICD-10-CM | POA: Diagnosis not present

## 2018-02-24 DIAGNOSIS — Z0000 Encounter for general adult medical examination without abnormal findings: Secondary | ICD-10-CM

## 2018-02-24 DIAGNOSIS — M170 Bilateral primary osteoarthritis of knee: Secondary | ICD-10-CM

## 2018-02-24 DIAGNOSIS — Z1239 Encounter for other screening for malignant neoplasm of breast: Secondary | ICD-10-CM

## 2018-02-24 LAB — POCT URINALYSIS DIPSTICK
Bilirubin, UA: NEGATIVE
Blood, UA: NEGATIVE
Glucose, UA: NEGATIVE
Ketones, UA: NEGATIVE
LEUKOCYTES UA: NEGATIVE
Nitrite, UA: NEGATIVE
PH UA: 5 (ref 5.0–8.0)
Protein, UA: NEGATIVE
Spec Grav, UA: 1.02 (ref 1.010–1.025)
UROBILINOGEN UA: 0.2 U/dL

## 2018-02-24 MED ORDER — OMEPRAZOLE 20 MG PO CPDR: 20.0000 mg | DELAYED_RELEASE_CAPSULE | Freq: Two times a day (BID) | ORAL | 3 refills | Status: DC

## 2018-02-24 MED ORDER — ALBUTEROL SULFATE 108 (90 BASE) MCG/ACT IN AEPB: 2.0000 | INHALATION_SPRAY | Freq: Two times a day (BID) | RESPIRATORY_TRACT | 3 refills | Status: DC | PRN

## 2018-02-24 MED ORDER — MOMETASONE FUROATE 50 MCG/ACT NA SUSP: 2.0000 | Freq: Every day | NASAL | 3 refills | Status: DC

## 2018-02-24 MED ORDER — CELECOXIB 200 MG PO CAPS: 200.0000 mg | ORAL_CAPSULE | Freq: Every day | ORAL | 3 refills | Status: DC

## 2018-02-24 NOTE — Progress Notes (Signed)
Date:  02/24/2018   Name:  Kaitlyn Mcdaniel   DOB:  11/18/1970   MRN:  [id number]   Chief Complaint: Annual Exam Kaitlyn Mcdaniel is a 47 y.o. female who presents today for her Complete Annual Exam. She feels fairly well. She reports exercising walking. She reports she is sleeping fairly well. Mammogram is due.  Gastroesophageal Reflux  She reports no abdominal pain, no chest pain, no coughing or no wheezing. Pertinent negatives include no fatigue. She has tried a PPI for the symptoms. The treatment provided significant relief.   OSA - uses CPAP at night with nasal pillows but can't use it more than 3-4 hours before having to remove it due to mild claustrophobia.  Lately she has not been putting it on nightly - only when she gets very tired.  Knee pain - still having chronic right knee pain since TKR.  Xrays show normal hardware alignment.  She has been using celebrex but is out of refills.    Review of Systems  Constitutional: Negative for chills, fatigue and fever.  HENT: Negative for congestion, hearing loss, tinnitus, trouble swallowing and voice change.   Eyes: Negative for visual disturbance.  Respiratory: Negative for cough, chest tightness, shortness of breath and wheezing.   Cardiovascular: Positive for leg swelling (intermittent foot swelling - resolves with elevation). Negative for chest pain and palpitations.  Gastrointestinal: Negative for abdominal pain, constipation, diarrhea and vomiting.  Endocrine: Negative for polydipsia and polyuria.  Genitourinary: Negative for dysuria, frequency, genital sores, vaginal bleeding and vaginal discharge.  Musculoskeletal: Positive for arthralgias (in right knee). Negative for gait problem and joint swelling.  Skin: Negative for color change and rash.  Neurological: Positive for dizziness (mild intermittent sx over past few weeks). Negative for tremors, light-headedness and headaches.  Hematological: Negative for adenopathy. Does  not bruise/bleed easily.  Psychiatric/Behavioral: Negative for dysphoric mood and sleep disturbance. The patient is not nervous/anxious.     Patient Active Problem List   Diagnosis Date Noted  . Mixed hyperlipidemia 02/24/2018  . 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 Status post total knee replacement, left 09/02/2016  . Elevated blood sugar 09/05/2015  . OSA (obstructive sleep apnea) 08/06/2015  . Exercise-induced asthma 04/29/2015  . GERD without esophagitis 04/29/2015  . Hx of abnormal cervical Papanicolaou smear 04/29/2015  . Degenerative arthritis of knee, bilateral 03/08/2015    Prior to Admission medications   Medication Sig Start Date End Date Taking? Authorizing Provider  albuterol (PROVENTIL HFA;VENTOLIN HFA) 108 (90 Base) MCG/ACT inhaler Inhale 2 puffs into the lungs every 6 (six) hours as needed for wheezing or shortness of breath. 10/21/16  Yes Glean Hess, MD  NON FORMULARY CPAP at night   Yes [provider]  omeprazole (PRILOSEC) 20 MG capsule Take 1 capsule (20 mg total) by mouth 2 (two) times daily before a meal. 01/19/17  Yes Glean Hess, MD  celecoxib (CELEBREX) 200 MG capsule Take 200 mg by mouth 2 (two) times daily.    [provider]    No Known Allergies  Past Surgical History:  Procedure Laterality Date  . BREAST REDUCTION SURGERY  1999  . CERVIX LESION DESTRUCTION  1998   abnormal PAP  . REDUCTION MAMMAPLASTY Bilateral 1999  . TOTAL KNEE ARTHROPLASTY Left 06/2016  . TOTAL KNEE ARTHROPLASTY Right 09/2016    Social History   Tobacco Use  . Smoking status: Never Smoker  . Smokeless tobacco: Never Used  Substance Use Topics  .  Alcohol use: Yes    Alcohol/week: 0.6 oz    Types: 1 Glasses of wine per week  . Drug use: No     Medication list has been reviewed and updated.  Current Meds  Medication Sig  . albuterol (PROVENTIL HFA;VENTOLIN HFA) 108 (90 Base) MCG/ACT inhaler Inhale 2 puffs into the lungs  every 6 (six) hours as needed for wheezing or shortness of breath.  . NON FORMULARY CPAP at night  . omeprazole (PRILOSEC) 20 MG capsule Take 1 capsule (20 mg total) by mouth 2 (two) times daily before a meal.  . [DISCONTINUED] benzonatate (TESSALON) 100 MG capsule Take 1 capsule (100 mg total) by mouth 3 (three) times daily.  . [DISCONTINUED] ibuprofen (ADVIL,MOTRIN) 800 MG tablet Take 1 tablet (800 mg total) by mouth 3 (three) times daily.  . [DISCONTINUED] UNABLE TO FIND CPAP nite time    PHQ 2/9 Scores 02/24/2018 10/21/2016  PHQ - 2 Score 0 0    Physical Exam  Constitutional: She is oriented to person, place, and time. She appears well-developed and well-nourished. No distress.  HENT:  Head: Normocephalic and atraumatic.  Right Ear: Tympanic membrane and ear canal normal.  Left Ear: Tympanic membrane and ear canal normal.  Nose: Right sinus exhibits no maxillary sinus tenderness. Left sinus exhibits no maxillary sinus tenderness.  Mouth/Throat: Uvula is midline and oropharynx is clear and moist.  Eyes: Conjunctivae and EOM are normal. Right eye exhibits no discharge. Left eye exhibits no discharge. No scleral icterus.  Neck: Normal range of motion. Carotid bruit is not present. No erythema present. No thyromegaly present.  Cardiovascular: Normal rate, regular rhythm, normal heart sounds and normal pulses.  Pulmonary/Chest: Effort normal. No respiratory distress. She has no wheezes. Right breast exhibits no mass, no nipple discharge, no skin change and no tenderness. Left breast exhibits no mass, no nipple discharge, no skin change and no tenderness.  Abdominal: Soft. Bowel sounds are normal. There is no hepatosplenomegaly. There is no tenderness. There is no CVA tenderness.  Musculoskeletal: Normal range of motion.  Lymphadenopathy:    She has no cervical adenopathy.    She has no axillary adenopathy.  Neurological: She is alert and oriented to person, place, and time. She has normal  reflexes. No cranial nerve deficit or sensory deficit.  Skin: Skin is warm, dry and intact. No rash noted.  Psychiatric: She has a normal mood and affect. Her speech is normal and behavior is normal. Thought content normal.  Nursing note and vitals reviewed.   BP 110/60   Pulse 65   Temp 97.8 F (36.6 C) (Oral)   Resp 16   Ht 5' 3.5" (1.613 m)   Wt 272 lb (123.4 kg)   LMP 01/04/2018   SpO2 100%   BMI 47.43 kg/m   Assessment and Plan: 1. Annual physical exam Normal exam except for weight Continue efforts at diet and exercise - POCT urinalysis dipstick  2. Breast cancer screening Schedule at [place]; Future  3. GERD without esophagitis Continue omeprozole - CBC with Differential/Platelet  4. OSA (obstructive sleep apnea) Encourage pt to use CPAP nightly - even 3-4 hours per night is beneficial  5. Primary osteoarthritis of both knees Continue celebrex  6. Elevated blood sugar - Comprehensive metabolic panel - Hemoglobin A1c - TSH  7. Mixed hyperlipidemia - Lipid panel   Halina Maidens

## 2018-02-26 LAB — CBC WITH DIFFERENTIAL/PLATELET
BASOS: 0 %
Basophils Absolute: 0 10*3/uL (ref 0.0–0.2)
EOS (ABSOLUTE): 0.2 10*3/uL (ref 0.0–0.4)
EOS: 4 %
HEMATOCRIT: 35.9 % (ref 34.0–46.6)
Hemoglobin: 10.7 g/dL — ABNORMAL LOW (ref 11.1–15.9)
Immature Grans (Abs): 0 10*3/uL (ref 0.0–0.1)
Immature Granulocytes: 0 %
LYMPHS ABS: 1.6 10*3/uL (ref 0.7–3.1)
Lymphs: 26 %
MCH: 22.5 pg — ABNORMAL LOW (ref 26.6–33.0)
MCHC: 29.8 g/dL — AB (ref 31.5–35.7)
MCV: 76 fL — AB (ref 79–97)
MONOS ABS: 0.5 10*3/uL (ref 0.1–0.9)
Monocytes: 8 %
NEUTROS PCT: 62 %
Neutrophils Absolute: 3.8 10*3/uL (ref 1.4–7.0)
PLATELETS: 248 10*3/uL (ref 150–450)
RBC: 4.75 x10E6/uL (ref 3.77–5.28)
RDW: 16.3 % — AB (ref 12.3–15.4)
WBC: 6.2 10*3/uL (ref 3.4–10.8)

## 2018-02-26 LAB — COMPREHENSIVE METABOLIC PANEL
ALBUMIN: 3.8 g/dL (ref 3.5–5.5)
ALT: 9 IU/L (ref 0–32)
AST: 9 IU/L (ref 0–40)
Albumin/Globulin Ratio: 1.4 (ref 1.2–2.2)
Alkaline Phosphatase: 109 IU/L (ref 39–117)
BILIRUBIN TOTAL: 0.3 mg/dL (ref 0.0–1.2)
BUN / CREAT RATIO: 18 (ref 9–23)
BUN: 14 mg/dL (ref 6–24)
CALCIUM: 8.9 mg/dL (ref 8.7–10.2)
CO2: 23 mmol/L (ref 20–29)
CREATININE: 0.76 mg/dL (ref 0.57–1.00)
Chloride: 103 mmol/L (ref 96–106)
GFR, EST AFRICAN AMERICAN: 107 mL/min/{1.73_m2} (ref >59)
GFR, EST NON AFRICAN AMERICAN: 93 mL/min/{1.73_m2} (ref >59)
GLUCOSE: 81 mg/dL (ref 65–99)
Globulin, Total: 2.8 g/dL (ref 1.5–4.5)
Potassium: 4.5 mmol/L (ref 3.5–5.2)
Sodium: 141 mmol/L (ref 134–144)
TOTAL PROTEIN: 6.6 g/dL (ref 6.0–8.5)

## 2018-02-26 LAB — HEMOGLOBIN A1C
Est. average glucose Bld gHb Est-mCnc: 123 mg/dL
Hgb A1c MFr Bld: 5.9 % — ABNORMAL HIGH (ref 4.8–5.6)

## 2018-02-26 LAB — LIPID PANEL
CHOL/HDL RATIO: 4.6 ratio — AB (ref 0.0–4.4)
CHOLESTEROL TOTAL: 223 mg/dL — AB (ref 100–199)
HDL: 48 mg/dL (ref >39)
LDL CALC: 145 mg/dL — AB (ref 0–99)
TRIGLYCERIDES: 151 mg/dL — AB (ref 0–149)
VLDL CHOLESTEROL CAL: 30 mg/dL (ref 5–40)

## 2018-02-26 LAB — TSH: TSH: 1.41 u[IU]/mL (ref 0.450–4.500)

## 2018-03-15 ENCOUNTER — Ambulatory Visit
Admission: RE | Admit: 2018-03-15 | Discharge: 2018-03-15 | Disposition: A | Discharge status: Home / Self Care | Payer: No Typology Code available for payment source | Source: Ambulatory Visit | Attending: Internal Medicine | Admitting: Internal Medicine

## 2018-03-15 DIAGNOSIS — Z1239 Encounter for other screening for malignant neoplasm of breast: Secondary | ICD-10-CM

## 2018-03-15 DIAGNOSIS — Z1231 Encounter for screening mammogram for malignant neoplasm of breast: Secondary | ICD-10-CM | POA: Diagnosis present

## 2018-03-25 ENCOUNTER — Encounter: Payer: Self-pay | Admitting: Internal Medicine

## 2018-03-26 ENCOUNTER — Other Ambulatory Visit: Payer: Self-pay | Admitting: Internal Medicine

## 2018-03-26 ENCOUNTER — Other Ambulatory Visit: Payer: Self-pay

## 2018-03-26 DIAGNOSIS — D509 Iron deficiency anemia, unspecified: Secondary | ICD-10-CM | POA: Insufficient documentation

## 2018-03-26 DIAGNOSIS — D649 Anemia, unspecified: Secondary | ICD-10-CM

## 2018-03-27 ENCOUNTER — Other Ambulatory Visit: Payer: Self-pay | Admitting: Internal Medicine

## 2018-03-27 DIAGNOSIS — F418 Other specified anxiety disorders: Secondary | ICD-10-CM | POA: Insufficient documentation

## 2018-03-27 LAB — CBC WITH DIFFERENTIAL/PLATELET
BASOS ABS: 0 10*3/uL (ref 0.0–0.2)
BASOS: 0 %
EOS (ABSOLUTE): 0.2 10*3/uL (ref 0.0–0.4)
Eos: 2 %
Hematocrit: 34.9 % (ref 34.0–46.6)
Hemoglobin: 10.9 g/dL — ABNORMAL LOW (ref 11.1–15.9)
Immature Grans (Abs): 0 10*3/uL (ref 0.0–0.1)
Immature Granulocytes: 0 %
LYMPHS ABS: 1.8 10*3/uL (ref 0.7–3.1)
Lymphs: 26 %
MCH: 23.7 pg — AB (ref 26.6–33.0)
MCHC: 31.2 g/dL — AB (ref 31.5–35.7)
MCV: 76 fL — AB (ref 79–97)
Monocytes Absolute: 0.5 10*3/uL (ref 0.1–0.9)
Monocytes: 7 %
Neutrophils Absolute: 4.3 10*3/uL (ref 1.4–7.0)
Neutrophils: 65 %
PLATELETS: 234 10*3/uL (ref 150–450)
RBC: 4.59 x10E6/uL (ref 3.77–5.28)
RDW: 15.8 % — ABNORMAL HIGH (ref 12.3–15.4)
WBC: 6.9 10*3/uL (ref 3.4–10.8)

## 2018-03-27 MED ORDER — TRAZODONE HCL 50 MG PO TABS: 25.0000 mg | ORAL_TABLET | Freq: Every day | ORAL | 0 refills | Status: DC

## 2018-03-28 ENCOUNTER — Other Ambulatory Visit: Payer: Self-pay | Admitting: Internal Medicine

## 2018-03-28 DIAGNOSIS — D649 Anemia, unspecified: Secondary | ICD-10-CM

## 2018-05-03 NOTE — Progress Notes (Signed)
Patient informed of recheck labs. Will come up to recheck this week.

## 2018-11-16 ENCOUNTER — Encounter: Payer: Self-pay | Admitting: Internal Medicine

## 2018-11-16 ENCOUNTER — Other Ambulatory Visit: Payer: Self-pay

## 2018-11-16 ENCOUNTER — Ambulatory Visit (INDEPENDENT_AMBULATORY_CARE_PROVIDER_SITE_OTHER): Payer: No Typology Code available for payment source | Admitting: Internal Medicine

## 2018-11-16 VITALS — BP 142/80 | HR 76 | Ht 63.0 in | Wt 279.0 lb

## 2018-11-16 DIAGNOSIS — N920 Excessive and frequent menstruation with regular cycle: Secondary | ICD-10-CM

## 2018-11-16 DIAGNOSIS — R030 Elevated blood-pressure reading, without diagnosis of hypertension: Secondary | ICD-10-CM

## 2018-11-16 DIAGNOSIS — H6593 Unspecified nonsuppurative otitis media, bilateral: Secondary | ICD-10-CM

## 2018-11-16 DIAGNOSIS — R42 Dizziness and giddiness: Secondary | ICD-10-CM

## 2018-11-16 NOTE — Progress Notes (Signed)
Date:  11/16/2018   Name:  Kaitlyn Mcdaniel   DOB:  02-Jan-1971   MRN:  [id number]   Chief Complaint: Hypertension (At the dentist BP was 149/89. Last three visit diastolic was over 619.); Vaginal Bleeding (Spotting or bleeding everyday since Thanksgiving. Last week was going through a pad every two-3 hours. ); and Dizziness (Started 6 weeks ago to where room is spinning. Every Saturday morning she is dizzy. Woke up at normal time and it didn't happen. New problem. )  Hypertension  This is a new (noted to be high at the dentist) problem. Pertinent negatives include no chest pain, headaches, palpitations or shortness of breath. (Dental pain) There are no associated agents to hypertension. Risk factors for coronary artery disease include obesity. Past treatments include nothing.  Vaginal Bleeding  The patient's primary symptoms include vaginal bleeding. Primary symptoms comment: three months ago started to have some hot flashes and spotting in between periods.  Period last week was heavier with clots but no pain.. The patient is experiencing no pain. Pertinent negatives include no chills, fever, headaches, nausea or vomiting.  Dizziness  This is a new problem. The current episode started 1 to 4 weeks ago. The problem occurs intermittently (about once a week). Associated symptoms include vertigo. Pertinent negatives include no arthralgias, chest pain, chills, coughing, fatigue, fever, headaches, nausea, numbness, vomiting or weakness. Associated symptoms comments: Several episodes in one day - less than a minute each. She has tried nothing for the symptoms.    Review of Systems  Constitutional: Negative for chills, fatigue and fever.  HENT: Negative for ear discharge, ear pain, hearing loss and tinnitus.   Eyes: Negative for visual disturbance.  Respiratory: Negative for cough, chest tightness and shortness of breath.   Cardiovascular: Negative for chest pain, palpitations and leg swelling.    Gastrointestinal: Negative for nausea and vomiting.       Only n/v after first day of vertigo  Genitourinary: Positive for vaginal bleeding. Negative for vaginal pain.  Musculoskeletal: Negative for arthralgias.  Neurological: Positive for dizziness and vertigo. Negative for tremors, weakness, light-headedness, numbness and headaches.  Psychiatric/Behavioral: Negative for sleep disturbance.    Patient Active Problem List   Diagnosis Date Noted  . Situational anxiety 03/27/2018  . Anemia 03/26/2018  . Mixed hyperlipidemia 02/24/2018  . 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 Status post total knee replacement, left 09/02/2016  . Elevated blood sugar 09/05/2015  . OSA (obstructive sleep apnea) 08/06/2015  . Exercise-induced asthma 04/29/2015  . GERD without esophagitis 04/29/2015  . Hx of abnormal cervical Papanicolaou smear 04/29/2015  . Degenerative arthritis of knee, bilateral 03/08/2015    No Known Allergies  Past Surgical History:  Procedure Laterality Date  . BREAST REDUCTION SURGERY  1999  . CERVIX LESION DESTRUCTION  1998   abnormal PAP  . REDUCTION MAMMAPLASTY Bilateral 1999  . TOTAL KNEE ARTHROPLASTY Left 06/2016  . TOTAL KNEE ARTHROPLASTY Right 09/2016    Social History   Tobacco Use  . Smoking status: Never Smoker  . Smokeless tobacco: Never Used  Substance Use Topics  . Alcohol use: Yes    Alcohol/week: 1.0 standard drinks    Types: 1 Glasses of wine per week  . Drug use: No     Medication list has been reviewed and updated.  Current Meds  Medication Sig  . Albuterol Sulfate (PROAIR RESPICLICK) 509 (90 Base) MCG/ACT AEPB Inhale 2 puffs into the lungs 2 (two) times daily as needed.  Marland Kitchen  ibuprofen (ADVIL,MOTRIN) 600 MG tablet Take 600 mg by mouth every 6 (six) hours as needed.  . NON FORMULARY CPAP at night  . omeprazole (PRILOSEC) 20 MG capsule Take 1 capsule (20 mg total) by mouth 2 (two) times daily before a meal.  . traZODone  (DESYREL) 50 MG tablet Take 0.5-1 tablets (25-50 mg total) by mouth at bedtime.    PHQ 2/9 Scores 11/16/2018 02/24/2018 10/21/2016  PHQ - 2 Score 0 0 0   Wt Readings from Last 3 Encounters:  11/16/18 279 lb (126.6 kg)  02/24/18 272 lb (123.4 kg)  10/13/17 273 lb (123.8 kg)    Physical Exam Vitals signs and nursing note reviewed.  Constitutional:      General: She is not in acute distress.    Appearance: Normal appearance. She is well-developed.  HENT:     Head: Normocephalic and atraumatic.     Right Ear: Ear canal normal. A middle ear effusion is present.     Left Ear: Ear canal normal. A middle ear effusion is present.     Nose:     Right Sinus: No maxillary sinus tenderness.     Left Sinus: No maxillary sinus tenderness.  Eyes:     Extraocular Movements: Extraocular movements intact.     Conjunctiva/sclera: Conjunctivae normal.     Pupils: Pupils are equal, round, and reactive to light.  Neck:     Musculoskeletal: Normal range of motion and neck supple.     Vascular: No carotid bruit.  Cardiovascular:     Rate and Rhythm: Normal rate and regular rhythm.     Pulses: Normal pulses.  Pulmonary:     Effort: Pulmonary effort is normal. No respiratory distress.     Breath sounds: Normal breath sounds. No wheezing or rhonchi.  Musculoskeletal: Normal range of motion.  Lymphadenopathy:     Cervical: No cervical adenopathy.  Skin:    General: Skin is warm and dry.     Findings: No erythema or rash.  Neurological:     Mental Status: She is alert and oriented to person, place, and time.  Psychiatric:        Attention and Perception: Attention normal.        Mood and Affect: Mood normal.        Behavior: Behavior normal.        Thought Content: Thought content normal.     BP (!) 144/82 (BP Location: Right Wrist, Patient Position: Sitting, Cuff Size: Normal)   Pulse 76   Ht 5\' 3"  (1.6 m)   Wt 279 lb (126.6 kg)   SpO2 100%   BMI 49.42 kg/m   Assessment and Plan: 1.  Middle ear effusion, bilateral Recommend ENT to evaluate, esp in view of the vertigo - Ambulatory referral to ENT  2. Vertigo None in 2 weeks - hopefully will not recur - Ambulatory referral to ENT  3. Menorrhagia with regular cycle Needs GYN exam and testing - CBC with Differential/Platelet - Ambulatory referral to Obstetrics / Gynecology  4. Elevated blood pressure reading without diagnosis of hypertension Monitor at work, limit sodium, increase water intake   Partially dictated using Editor, commissioning. Any errors are unintentional.  Halina Maidens, MD [facility]  11/16/2018

## 2018-11-17 LAB — CBC WITH DIFFERENTIAL/PLATELET
Basophils Absolute: 0 10*3/uL (ref 0.0–0.2)
Basos: 1 %
EOS (ABSOLUTE): 0.2 10*3/uL (ref 0.0–0.4)
Eos: 2 %
Hematocrit: 33.7 % — ABNORMAL LOW (ref 34.0–46.6)
Hemoglobin: 11 g/dL — ABNORMAL LOW (ref 11.1–15.9)
IMMATURE GRANULOCYTES: 0 %
Immature Grans (Abs): 0 10*3/uL (ref 0.0–0.1)
Lymphocytes Absolute: 2.1 10*3/uL (ref 0.7–3.1)
Lymphs: 26 %
MCH: 24.6 pg — ABNORMAL LOW (ref 26.6–33.0)
MCHC: 32.6 g/dL (ref 31.5–35.7)
MCV: 75 fL — ABNORMAL LOW (ref 79–97)
MONOCYTES: 6 %
Monocytes Absolute: 0.5 10*3/uL (ref 0.1–0.9)
Neutrophils Absolute: 5.5 10*3/uL (ref 1.4–7.0)
Neutrophils: 65 %
Platelets: 278 10*3/uL (ref 150–450)
RBC: 4.47 x10E6/uL (ref 3.77–5.28)
RDW: 14.4 % (ref 11.7–15.4)
WBC: 8.2 10*3/uL (ref 3.4–10.8)

## 2018-11-26 ENCOUNTER — Encounter: Payer: Self-pay | Admitting: Certified Nurse Midwife

## 2018-11-26 ENCOUNTER — Other Ambulatory Visit: Payer: Self-pay

## 2018-11-26 ENCOUNTER — Ambulatory Visit (INDEPENDENT_AMBULATORY_CARE_PROVIDER_SITE_OTHER): Payer: No Typology Code available for payment source | Admitting: Certified Nurse Midwife

## 2018-11-26 VITALS — BP 165/89 | HR 70 | Ht 63.0 in | Wt 282.1 lb

## 2018-11-26 DIAGNOSIS — N939 Abnormal uterine and vaginal bleeding, unspecified: Secondary | ICD-10-CM | POA: Insufficient documentation

## 2018-11-26 DIAGNOSIS — E669 Obesity, unspecified: Secondary | ICD-10-CM | POA: Insufficient documentation

## 2018-11-26 NOTE — Progress Notes (Signed)
GYN ENCOUNTER NOTE  Subjective:       Kaitlyn Mcdaniel is a 48 y.o. No obstetric history on file. female is here for gynecologic evaluation of the following issues:  1.Abnormal uterine bleeding. Pt states she has been bleeding since Thanksgiving. It has sometime decreased to spotting but she has consistently been bleeding. She has passed some small clots. She denies associate symptoms of fatigue or lightheadedness.  She denies pain.    Gynecologic History No LMP recorded. (Menstrual status: Irregular Periods). Contraception: none Last Pap: 09/05/2015. Results were: normal  Last mammogram: 03/15/18 Results were: normal  Obstetric History OB History  No obstetric history on file.    No past medical history on file.  Past Surgical History:  Procedure Laterality Date  . BREAST REDUCTION SURGERY  1999  . CERVIX LESION DESTRUCTION  1998   abnormal PAP  . REDUCTION MAMMAPLASTY Bilateral 1999  . TOTAL KNEE ARTHROPLASTY Left 06/2016  . TOTAL KNEE ARTHROPLASTY Right 09/2016    Current Outpatient Medications on File Prior to Visit  Medication Sig Dispense Refill  . Albuterol Sulfate (PROAIR RESPICLICK) 702 (90 Base) MCG/ACT AEPB Inhale 2 puffs into the lungs 2 (two) times daily as needed. 3 each 3  . ibuprofen (ADVIL,MOTRIN) 600 MG tablet Take 600 mg by mouth every 6 (six) hours as needed.    . NON FORMULARY CPAP at night    . omeprazole (PRILOSEC) 20 MG capsule Take 1 capsule (20 mg total) by mouth 2 (two) times daily before a meal. 180 capsule 3  . traZODone (DESYREL) 50 MG tablet Take 0.5-1 tablets (25-50 mg total) by mouth at bedtime. 90 tablet 0   No current facility-administered medications on file prior to visit.     No Known Allergies  Social History   Socioeconomic History  . Marital status: Married    Spouse name: Not on file  . Number of children: Not on file  . Years of education: Not on file  . Highest education level: Not on file  Occupational History  . Not on  file  Social Needs  . Financial resource strain: Not on file  . Food insecurity:    Worry: Not on file    Inability: Not on file  . Transportation needs:    Medical: Not on file    Non-medical: Not on file  Tobacco Use  . Smoking status: Never Smoker  . Smokeless tobacco: Never Used  Substance and Sexual Activity  . Alcohol use: Yes    Alcohol/week: 1.0 standard drinks    Types: 1 Glasses of wine per week  . Drug use: No  . Sexual activity: Yes  Lifestyle  . Physical activity:    Days per week: Not on file    Minutes per session: Not on file  . Stress: Not on file  Relationships  . Social connections:    Talks on phone: Not on file    Gets together: Not on file    Attends religious service: Not on file    Active member of club or organization: Not on file    Attends meetings of clubs or organizations: Not on file    Relationship status: Not on file  . Intimate partner violence:    Fear of current or ex partner: Not on file    Emotionally abused: Not on file    Physically abused: Not on file    Forced sexual activity: Not on file  Other Topics Concern  . Not on file  Social History Narrative  . Not on file    Family History  Problem Relation Age of Onset  . Hypertension Mother   . Hypertension Father   . Renal cancer Father   . Breast cancer Neg Hx     The following portions of the patient's history were reviewed and updated as appropriate: allergies, current medications, past family history, past medical history, past social history, past surgical history and problem list.  Review of Systems Review of Systems - Negative except as mentioned in HPI Review of Systems - General ROS: negative for - chills, fatigue, fever, hot flashes, malaise or night sweats Hematological and Lymphatic ROS: negative for - bleeding problems or swollen lymph nodes Gastrointestinal ROS: negative for - abdominal pain, blood in stools, change in bowel habits and  nausea/vomiting Musculoskeletal ROS: negative for - joint pain, muscle pain or muscular weakness Genito-Urinary ROS: negative for - change in menstrual cycle, dysmenorrhea, dyspareunia, dysuria, genital discharge, genital ulcers, hematuria, incontinence, nocturia or pelvic pain. Positive for irregular/heavy menses  Objective:   There were no vitals taken for this visit. CONSTITUTIONAL: Well-developed, well-nourished, obese female in no acute distress.  HENT:  Normocephalic, atraumatic.  NECK: Normal range of motion, supple, no masses.  Normal thyroid.  SKIN: Skin is warm and dry. No rash noted. Not diaphoretic. No erythema. No pallor. [place]: Alert and oriented to person, place, and time. PSYCHIATRIC: Normal mood and affect. Normal behavior. Normal judgment and thought content. CARDIOVASCULAR:Not Examined RESPIRATORY: Not Examined BREASTS: Not Examined ABDOMEN: Soft, non distended; Non tender.  No Organomegaly. PELVIC:  External Genitalia: Normal  BUS: Normal  Vagina: Normal  Cervix: Normal, scar tissue noted for cryo procedure   Uterus: Normal size, shape,consistency, mobile. Exam compromised due to body habitus. [place]nex[place]: Normal  RV: Normal   Bladder: Nontender MUSCULOSKELETAL: Normal range of motion. No tenderness.  No cyanosis, clubbing, or edema.  Assessment:   Abnormal Uterine Bleeding     Plan:   Pelvic u/s ordered. [place] today. Pt had CBC and TSH 02/25/18. Results reviewed. Discussed possible causes and treatment options. Including ablation, IUD/birth control/ lysteda. Brochure given on Novasure. Will follow up with result.   Philip Aspen, CNM

## 2018-11-26 NOTE — Patient Instructions (Signed)
Abnormal Uterine Bleeding  Abnormal uterine bleeding means bleeding more than usual from your uterus. It can include:   Bleeding between periods.   Bleeding after sex.   Bleeding that is heavier than normal.   Periods that last longer than usual.   Bleeding after you have stopped having your period (menopause).  There are many problems that may cause this. You should see a doctor for any kind of bleeding that is not normal. Treatment depends on the cause of the bleeding.  Follow these instructions at home:   Watch your condition for any changes.   Do not use tampons, douche, or have sex, if your doctor tells you not to.   Change your pads often.   Get regular well-woman exams. Make sure they include a pelvic exam and cervical cancer screening.   Keep all follow-up visits as told by your doctor. This is important.  Contact a doctor if:   The bleeding lasts more than one week.   You feel dizzy at times.   You feel like you are going to throw up (nauseous).   You throw up.  Get help right away if:   You pass out.   You have to change pads every hour.   You have belly (abdominal) pain.   You have a fever.   You get sweaty.   You get weak.   You passing large blood clots from your vagina.  Summary   Abnormal uterine bleeding means bleeding more than usual from your uterus.   There are many problems that may cause this. You should see a doctor for any kind of bleeding that is not normal.   Treatment depends on the cause of the bleeding.  This information is not intended to replace advice given to you by your health care provider. Make sure you discuss any questions you have with your health care provider.  Document Released: 06/29/2009 Document Revised: 08/26/2016 Document Reviewed: 08/26/2016  Elsevier Interactive Patient Education  2019 Elsevier Inc[employer]

## 2018-11-27 LAB — FOLLICLE STIMULATING HORMONE: FSH: 5.1 m[IU]/mL

## 2018-12-01 ENCOUNTER — Telehealth: Payer: Self-pay | Admitting: Certified Nurse Midwife

## 2018-12-02 ENCOUNTER — Ambulatory Visit: Payer: No Typology Code available for payment source

## 2019-03-09 ENCOUNTER — Other Ambulatory Visit: Payer: Self-pay

## 2019-03-09 ENCOUNTER — Encounter: Payer: Self-pay | Admitting: Internal Medicine

## 2019-03-09 DIAGNOSIS — K219 Gastro-esophageal reflux disease without esophagitis: Secondary | ICD-10-CM

## 2019-03-09 MED ORDER — OMEPRAZOLE 20 MG PO CPDR: 20.0000 mg | DELAYED_RELEASE_CAPSULE | Freq: Two times a day (BID) | ORAL | 0 refills | Status: DC

## 2019-03-15 ENCOUNTER — Other Ambulatory Visit: Payer: Self-pay | Admitting: Internal Medicine

## 2019-03-15 DIAGNOSIS — K219 Gastro-esophageal reflux disease without esophagitis: Secondary | ICD-10-CM

## 2019-04-11 ENCOUNTER — Other Ambulatory Visit: Payer: Self-pay

## 2019-04-11 ENCOUNTER — Ambulatory Visit (INDEPENDENT_AMBULATORY_CARE_PROVIDER_SITE_OTHER): Payer: No Typology Code available for payment source | Admitting: Internal Medicine

## 2019-04-11 ENCOUNTER — Encounter: Payer: Self-pay | Admitting: Internal Medicine

## 2019-04-11 VITALS — BP 112/70 | HR 84 | Temp 98.1°F | Ht 63.0 in | Wt 271.0 lb

## 2019-04-11 DIAGNOSIS — G4733 Obstructive sleep apnea (adult) (pediatric): Secondary | ICD-10-CM

## 2019-04-11 DIAGNOSIS — J45990 Exercise induced bronchospasm: Secondary | ICD-10-CM | POA: Diagnosis not present

## 2019-04-11 DIAGNOSIS — D649 Anemia, unspecified: Secondary | ICD-10-CM

## 2019-04-11 DIAGNOSIS — K219 Gastro-esophageal reflux disease without esophagitis: Secondary | ICD-10-CM

## 2019-04-11 DIAGNOSIS — Z1231 Encounter for screening mammogram for malignant neoplasm of breast: Secondary | ICD-10-CM

## 2019-04-11 DIAGNOSIS — E782 Mixed hyperlipidemia: Secondary | ICD-10-CM

## 2019-04-11 DIAGNOSIS — Z0000 Encounter for general adult medical examination without abnormal findings: Secondary | ICD-10-CM

## 2019-04-11 LAB — POCT URINALYSIS DIPSTICK
Bilirubin, UA: NEGATIVE
Blood, UA: NEGATIVE
Glucose, UA: NEGATIVE
Ketones, UA: NEGATIVE
Leukocytes, UA: NEGATIVE
Nitrite, UA: NEGATIVE
Protein, UA: NEGATIVE
Spec Grav, UA: 1.025 (ref 1.010–1.025)
Urobilinogen, UA: 0.2 E.U./dL
pH, UA: 5 (ref 5.0–8.0)

## 2019-04-11 MED ORDER — PROAIR RESPICLICK 108 (90 BASE) MCG/ACT IN AEPB: 2.0000 | INHALATION_SPRAY | Freq: Two times a day (BID) | RESPIRATORY_TRACT | 3 refills | Status: DC | PRN

## 2019-04-11 NOTE — Progress Notes (Signed)
Date:  04/11/2019   Name:  Kaitlyn Mcdaniel   DOB:  Apr 28, 1971   MRN:  [id number]   Chief Complaint: Annual Exam (Breast Exam. ) Kaitlyn Mcdaniel is a 48 y.o. female who presents today for her Complete Annual Exam. She feels fairly well. She reports exercising some swimming. She reports she is sleeping fairly well.   Mammogram 2019 Pap with cotesting 2016  Gastroesophageal Reflux She reports no abdominal pain, no chest pain, no coughing or no wheezing. Pertinent negatives include no fatigue.  OSA - on CPAP only a few times per week.  She has been unable to tolerate using it nightly. Dx'd 2016.  She denies morning headaches, daytime somnolence, depression. Abnormal vaginal bleeding - she was seen earlier this year by GYN.  Her exam was normal.  FSH was normal.  She was scheduled for an [place] but it was cancelled due to covid.  Since then she has not had any further issues.  Lab Results  Component Value Date   CREATININE 0.76 02/25/2018   BUN 14 02/25/2018   NA 141 02/25/2018   K 4.5 02/25/2018   CL 103 02/25/2018   CO2 23 02/25/2018   Lab Results  Component Value Date   CHOL 223 (H) 02/25/2018   HDL 48 02/25/2018   LDLCALC 145 (H) 02/25/2018   TRIG 151 (H) 02/25/2018   CHOLHDL 4.6 (H) 02/25/2018    Review of Systems  Constitutional: Negative for chills, fatigue and fever.  HENT: Negative for congestion, hearing loss, tinnitus, trouble swallowing and voice change.   Eyes: Negative for visual disturbance.  Respiratory: Negative for cough, chest tightness, shortness of breath and wheezing.   Cardiovascular: Negative for chest pain, palpitations and leg swelling.  Gastrointestinal: Negative for abdominal pain, constipation, diarrhea and vomiting.  Endocrine: Negative for polydipsia and polyuria.  Genitourinary: Negative for dysuria, frequency, genital sores, menstrual problem, vaginal bleeding and vaginal discharge.  Musculoskeletal: Positive for arthralgias (both knees).  Negative for gait problem and joint swelling.  Skin: Negative for color change and rash.  Neurological: Negative for dizziness, tremors, light-headedness and headaches.  Hematological: Negative for adenopathy. Does not bruise/bleed easily.  Psychiatric/Behavioral: Negative for dysphoric mood and sleep disturbance. The patient is not nervous/anxious.     Patient Active Problem List   Diagnosis Date Noted  . Obesity 11/26/2018  . Abnormal uterine bleeding 11/26/2018  . Situational anxiety 03/27/2018  . Anemia 03/26/2018  . Mixed hyperlipidemia 02/24/2018  . 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 Status post total knee replacement, left 09/02/2016  . Elevated blood sugar 09/05/2015  . OSA (obstructive sleep apnea) 08/06/2015  . Exercise-induced asthma 04/29/2015  . GERD without esophagitis 04/29/2015  . Hx of abnormal cervical Papanicolaou smear 04/29/2015  . Degenerative arthritis of knee, bilateral 03/08/2015    No Known Allergies  Past Surgical History:  Procedure Laterality Date  . BREAST REDUCTION SURGERY  1999  . CERVIX LESION DESTRUCTION  1998   abnormal PAP  . REDUCTION MAMMAPLASTY Bilateral 1999  . TOTAL KNEE ARTHROPLASTY Left 06/2016  . TOTAL KNEE ARTHROPLASTY Right 09/2016    Social History   Tobacco Use  . Smoking status: Never Smoker  . Smokeless tobacco: Never Used  Substance Use Topics  . Alcohol use: Yes    Alcohol/week: 1.0 standard drinks    Types: 1 Glasses of wine per week  . Drug use: No     Medication list has been reviewed and updated.  Current Meds  Medication Sig  . Albuterol Sulfate (PROAIR RESPICLICK) 073 (90 Base) MCG/ACT AEPB Inhale 2 puffs into the lungs 2 (two) times daily as needed.  Marland Kitchen ibuprofen (ADVIL,MOTRIN) 600 MG tablet Take 600 mg by mouth every 6 (six) hours as needed.  . NON FORMULARY CPAP at night  . omeprazole (PRILOSEC) 20 MG capsule TAKE 1 CAPSULE BY MOUTH 2 TIMES DAILY BEFORE A MEAL.  . [DISCONTINUED]  Albuterol Sulfate (PROAIR RESPICLICK) 710 (90 Base) MCG/ACT AEPB Inhale 2 puffs into the lungs 2 (two) times daily as needed.    PHQ 2/9 Scores 04/11/2019 11/16/2018 02/24/2018 10/21/2016  PHQ - 2 Score 0 0 0 0  PHQ- 9 Score 2 - - -    BP Readings from Last 3 Encounters:  04/11/19 112/70  11/26/18 (!) 165/89  11/16/18 (!) 142/80    Physical Exam Vitals signs and nursing note reviewed.  Constitutional:      General: She is not in acute distress.    Appearance: She is well-developed.  HENT:     Head: Normocephalic and atraumatic.     Right Ear: Tympanic membrane and ear canal normal.     Left Ear: Tympanic membrane and ear canal normal.     Nose:     Right Sinus: No maxillary sinus tenderness.     Left Sinus: No maxillary sinus tenderness.     Mouth/Throat:     Pharynx: Uvula midline.  Eyes:     General: No scleral icterus.       Right eye: No discharge.        Left eye: No discharge.     Conjunctiva/sclera: Conjunctivae normal.  Neck:     Musculoskeletal: Normal range of motion. No erythema.     Thyroid: No thyromegaly.     Vascular: No carotid bruit.  Cardiovascular:     Rate and Rhythm: Normal rate and regular rhythm.     Pulses: Normal pulses.     Heart sounds: Normal heart sounds.  Pulmonary:     Effort: Pulmonary effort is normal. No respiratory distress.     Breath sounds: No wheezing.  Chest:     Breasts:        Right: No mass, nipple discharge, skin change or tenderness.        Left: No mass, nipple discharge, skin change or tenderness.     Comments: S/p breast reduction bilaterally Abdominal:     General: Bowel sounds are normal.     Palpations: Abdomen is soft.     Tenderness: There is no abdominal tenderness.  Musculoskeletal:     Right knee: She exhibits decreased range of motion. She exhibits no effusion.     Left knee: She exhibits normal range of motion, no swelling and no effusion.     Right lower leg: No edema.     Left lower leg: No edema.      Comments: S/p bilateral knee replacements  Lymphadenopathy:     Cervical: No cervical adenopathy.  Skin:    General: Skin is warm and dry.     Capillary Refill: Capillary refill takes less than 2 seconds.     Findings: No rash.  Neurological:     Mental Status: She is alert and oriented to person, place, and time.     Cranial Nerves: No cranial nerve deficit.     Sensory: No sensory deficit.     Deep Tendon Reflexes: Reflexes are normal and symmetric.  Psychiatric:        Attention and  Perception: Attention normal.        Mood and Affect: Mood normal.        Speech: Speech normal.        Behavior: Behavior normal.        Thought Content: Thought content normal.     Wt Readings from Last 3 Encounters:  04/11/19 271 lb (122.9 kg)  11/26/18 282 lb 1 oz (127.9 kg)  11/16/18 279 lb (126.6 kg)    BP 112/70   Pulse 84   Temp 98.1 F (36.7 C) (Oral)   Ht 5\' 3"  (1.6 m)   Wt 271 lb (122.9 kg)   SpO2 97%   BMI 48.01 kg/m   Assessment and Plan: 1. Annual physical exam Normal exam except for weight Continue exercise and diet changes - Comprehensive metabolic panel - TSH - POCT urinalysis dipstick  2. Encounter for screening mammogram for breast cancer Schedule at [facility]; Future  3. GERD without esophagitis Controlled on PPI - CBC with Differential/Platelet  4. Mixed hyperlipidemia Continue dietary changes - Lipid panel  5. OSA (obstructive sleep apnea) Continue CPAP as often as tolerated  6. Anemia, unspecified type Check labs No further abnormal vaginal bleeding - if recurrent, proceed with [place] - CBC with Differential/Platelet  7. Exercise-induced asthma - Albuterol Sulfate (PROAIR RESPICLICK) 443 (90 Base) MCG/ACT AEPB; Inhale 2 puffs into the lungs 2 (two) times daily as needed.  Dispense: 3 each; Refill: 3   Partially dictated using Editor, commissioning. Any errors are unintentional.  Halina Maidens, MD [facility]  04/11/2019

## 2019-04-12 LAB — LIPID PANEL
Chol/HDL Ratio: 4.6 ratio — ABNORMAL HIGH (ref 0.0–4.4)
Cholesterol, Total: 215 mg/dL — ABNORMAL HIGH (ref 100–199)
HDL: 47 mg/dL (ref >39)
LDL Calculated: 147 mg/dL — ABNORMAL HIGH (ref 0–99)
Triglycerides: 106 mg/dL (ref 0–149)
VLDL Cholesterol Cal: 21 mg/dL (ref 5–40)

## 2019-04-12 LAB — COMPREHENSIVE METABOLIC PANEL
ALT: 11 IU/L (ref 0–32)
AST: 8 IU/L (ref 0–40)
Albumin/Globulin Ratio: 1.7 (ref 1.2–2.2)
Albumin: 4 g/dL (ref 3.8–4.8)
Alkaline Phosphatase: 95 IU/L (ref 39–117)
BUN/Creatinine Ratio: 21 (ref 9–23)
BUN: 16 mg/dL (ref 6–24)
Bilirubin Total: 0.3 mg/dL (ref 0.0–1.2)
CO2: 23 mmol/L (ref 20–29)
Calcium: 8.8 mg/dL (ref 8.7–10.2)
Chloride: 102 mmol/L (ref 96–106)
Creatinine, Ser: 0.77 mg/dL (ref 0.57–1.00)
GFR calc Af Amer: 105 mL/min/{1.73_m2} (ref >59)
GFR calc non Af Amer: 91 mL/min/{1.73_m2} (ref >59)
Globulin, Total: 2.3 g/dL (ref 1.5–4.5)
Glucose: 107 mg/dL — ABNORMAL HIGH (ref 65–99)
Potassium: 4.6 mmol/L (ref 3.5–5.2)
Sodium: 141 mmol/L (ref 134–144)
Total Protein: 6.3 g/dL (ref 6.0–8.5)

## 2019-04-12 LAB — CBC WITH DIFFERENTIAL/PLATELET
Basophils Absolute: 0 10*3/uL (ref 0.0–0.2)
Basos: 0 %
EOS (ABSOLUTE): 0 10*3/uL (ref 0.0–0.4)
Eos: 0 %
Hematocrit: 32.9 % — ABNORMAL LOW (ref 34.0–46.6)
Hemoglobin: 9.7 g/dL — ABNORMAL LOW (ref 11.1–15.9)
Immature Grans (Abs): 0 10*3/uL (ref 0.0–0.1)
Immature Granulocytes: 0 %
Lymphocytes Absolute: 1.5 10*3/uL (ref 0.7–3.1)
Lymphs: 23 %
MCH: 20.7 pg — ABNORMAL LOW (ref 26.6–33.0)
MCHC: 29.5 g/dL — ABNORMAL LOW (ref 31.5–35.7)
MCV: 70 fL — ABNORMAL LOW (ref 79–97)
Monocytes Absolute: 0.4 10*3/uL (ref 0.1–0.9)
Monocytes: 6 %
Neutrophils Absolute: 4.8 10*3/uL (ref 1.4–7.0)
Neutrophils: 71 %
Platelets: 264 10*3/uL (ref 150–450)
RBC: 4.68 x10E6/uL (ref 3.77–5.28)
RDW: 17.6 % — ABNORMAL HIGH (ref 11.7–15.4)
WBC: 6.7 10*3/uL (ref 3.4–10.8)

## 2019-04-12 LAB — TSH: TSH: 1.55 u[IU]/mL (ref 0.450–4.500)

## 2019-04-15 LAB — IRON AND TIBC
Iron Saturation: 10 % — ABNORMAL LOW (ref 15–55)
Iron: 33 ug/dL (ref 27–159)
Total Iron Binding Capacity: 341 ug/dL (ref 250–450)
UIBC: 308 ug/dL (ref 131–425)

## 2019-04-15 LAB — FERRITIN: Ferritin: 10 ng/mL — ABNORMAL LOW (ref 15–150)

## 2019-04-15 LAB — SPECIMEN STATUS REPORT

## 2019-04-26 ENCOUNTER — Other Ambulatory Visit: Payer: Self-pay

## 2019-04-26 ENCOUNTER — Ambulatory Visit
Admission: RE | Admit: 2019-04-26 | Discharge: 2019-04-26 | Disposition: A | Discharge status: Home / Self Care | Payer: No Typology Code available for payment source | Source: Ambulatory Visit | Attending: Internal Medicine | Admitting: Internal Medicine

## 2019-04-26 DIAGNOSIS — Z1231 Encounter for screening mammogram for malignant neoplasm of breast: Secondary | ICD-10-CM | POA: Insufficient documentation

## 2019-06-03 ENCOUNTER — Encounter: Payer: Self-pay | Admitting: Internal Medicine

## 2019-06-08 ENCOUNTER — Other Ambulatory Visit: Payer: Self-pay

## 2019-06-08 ENCOUNTER — Other Ambulatory Visit: Payer: No Typology Code available for payment source

## 2019-06-08 DIAGNOSIS — R413 Other amnesia: Secondary | ICD-10-CM

## 2019-06-08 DIAGNOSIS — D649 Anemia, unspecified: Secondary | ICD-10-CM

## 2019-06-09 ENCOUNTER — Encounter: Payer: Self-pay | Admitting: Internal Medicine

## 2019-06-09 DIAGNOSIS — D519 Vitamin B12 deficiency anemia, unspecified: Secondary | ICD-10-CM | POA: Insufficient documentation

## 2019-06-09 LAB — IRON,TIBC AND FERRITIN PANEL
Ferritin: 16 ng/mL (ref 15–150)
Iron Saturation: 11 % — ABNORMAL LOW (ref 15–55)
Iron: 37 ug/dL (ref 27–159)
Total Iron Binding Capacity: 326 ug/dL (ref 250–450)
UIBC: 289 ug/dL (ref 131–425)

## 2019-06-09 LAB — B12 AND FOLATE PANEL
Folate: 5.2 ng/mL (ref >3.0)
Vitamin B-12: 224 pg/mL — ABNORMAL LOW (ref 232–1245)

## 2019-06-17 ENCOUNTER — Encounter: Payer: Self-pay | Admitting: Internal Medicine

## 2019-06-24 ENCOUNTER — Ambulatory Visit
Admission: RE | Admit: 2019-06-24 | Discharge: 2019-06-24 | Disposition: A | Discharge status: Home / Self Care | Payer: No Typology Code available for payment source | Source: Ambulatory Visit | Attending: Certified Nurse Midwife | Admitting: Certified Nurse Midwife

## 2019-06-24 ENCOUNTER — Other Ambulatory Visit: Payer: Self-pay

## 2019-06-24 DIAGNOSIS — N939 Abnormal uterine and vaginal bleeding, unspecified: Secondary | ICD-10-CM | POA: Insufficient documentation

## 2019-08-10 ENCOUNTER — Other Ambulatory Visit: Payer: Self-pay | Admitting: Internal Medicine

## 2019-08-10 DIAGNOSIS — J45990 Exercise induced bronchospasm: Secondary | ICD-10-CM

## 2019-11-25 ENCOUNTER — Encounter: Payer: Self-pay | Admitting: Internal Medicine

## 2019-11-25 ENCOUNTER — Other Ambulatory Visit: Payer: Self-pay | Admitting: Internal Medicine

## 2019-11-25 DIAGNOSIS — Z6841 Body Mass Index (BMI) 40.0 and over, adult: Secondary | ICD-10-CM

## 2019-11-29 ENCOUNTER — Ambulatory Visit (INDEPENDENT_AMBULATORY_CARE_PROVIDER_SITE_OTHER): Payer: No Typology Code available for payment source | Admitting: Family Medicine

## 2019-11-29 ENCOUNTER — Encounter (INDEPENDENT_AMBULATORY_CARE_PROVIDER_SITE_OTHER): Payer: Self-pay | Admitting: Family Medicine

## 2019-11-29 ENCOUNTER — Other Ambulatory Visit: Payer: Self-pay

## 2019-11-29 VITALS — BP 144/83 | HR 65 | Temp 97.9°F | Ht 64.0 in | Wt 279.0 lb

## 2019-11-29 DIAGNOSIS — Z6841 Body Mass Index (BMI) 40.0 and over, adult: Secondary | ICD-10-CM

## 2019-11-29 DIAGNOSIS — E7800 Pure hypercholesterolemia, unspecified: Secondary | ICD-10-CM

## 2019-11-29 DIAGNOSIS — R0602 Shortness of breath: Secondary | ICD-10-CM

## 2019-11-29 DIAGNOSIS — R030 Elevated blood-pressure reading, without diagnosis of hypertension: Secondary | ICD-10-CM

## 2019-11-29 DIAGNOSIS — D649 Anemia, unspecified: Secondary | ICD-10-CM

## 2019-11-29 DIAGNOSIS — F3289 Other specified depressive episodes: Secondary | ICD-10-CM

## 2019-11-29 DIAGNOSIS — E538 Deficiency of other specified B group vitamins: Secondary | ICD-10-CM | POA: Diagnosis not present

## 2019-11-29 DIAGNOSIS — R739 Hyperglycemia, unspecified: Secondary | ICD-10-CM

## 2019-11-29 DIAGNOSIS — Z9189 Other specified personal risk factors, not elsewhere classified: Secondary | ICD-10-CM

## 2019-11-29 DIAGNOSIS — Z0289 Encounter for other administrative examinations: Secondary | ICD-10-CM

## 2019-11-29 DIAGNOSIS — R5383 Other fatigue: Secondary | ICD-10-CM | POA: Diagnosis not present

## 2019-11-29 NOTE — Progress Notes (Signed)
Chief Complaint:   OBESITY Kaitlyn Mcdaniel (MR# [id number]) is a 49 y.o. female who presents for evaluation and treatment of obesity and related comorbidities. Current BMI is Body mass index is 47.89 kg/m. Kaitlyn Mcdaniel has been struggling with her weight for many years and has been unsuccessful in either losing weight, maintaining weight loss, or reaching her healthy weight goal.  Kaitlyn Mcdaniel is currently in the action stage of change and ready to dedicate time achieving and maintaining a healthier weight. Kaitlyn Mcdaniel is interested in becoming our patient and working on intensive lifestyle modifications including (but not limited to) diet and exercise for weight loss.  Kaitlyn Mcdaniel's habits were reviewed today and are as follows: Her family eats meals together, she thinks her family will eat healthier with her, her desired weight loss is 109 lbs, she has been heavy most of her life, she started gaining weight in her early 20's, her heaviest weight ever was 283 pounds, she has significant food cravings issues, she snacks frequently in the evenings, she skips meals frequently, she is frequently drinking liquids with calories, she frequently makes poor food choices, she frequently eats larger portions than normal and she struggles with emotional eating.  Depression Screen Kaitlyn Mcdaniel's Food and Mood (modified PHQ-9) score was 14.  Depression screen PHQ 2/9 11/29/2019  Decreased Interest 2  Down, Depressed, Hopeless 1  PHQ - 2 Score 3  Altered sleeping 1  Tired, decreased energy 3  Change in appetite 2  Feeling bad or failure about yourself  0  Trouble concentrating 2  Moving slowly or fidgety/restless 2  Suicidal thoughts 0  PHQ-9 Score 13  Difficult doing work/chores Somewhat difficult   Subjective:   1. Other fatigue Kaitlyn Mcdaniel admits to daytime somnolence and admits to waking up still tired. Patent has a history of symptoms of daytime fatigue and morning headache. Kaitlyn Mcdaniel generally gets 5 or 7 hours  of sleep per night, and states that she has difficulty falling asleep. Snoring is present. Apneic episodes are not present. Epworth Sleepiness Score is 6.  2. Shortness of breath on exertion Kaitlyn Mcdaniel notes increasing shortness of breath with exercising and seems to be worsening over time with weight gain. She notes getting out of breath sooner with activity than she used to. This has not gotten worse recently. Kaitlyn Mcdaniel denies shortness of breath at rest or orthopnea.  3. Elevated blood pressure reading without diagnosis of hypertension Kaitlyn Mcdaniel has previous occasional elevated blood pressure readings in Epic. She is not currently on and antihypertensive.  4. B12 deficiency Kaitlyn Mcdaniel reports inconsistent use of cyanocobalamin 500 mg tablets.   5. Hyperglycemia Kaitlyn Mcdaniel's last A1c was elevated at 5.9 on 02/25/2018. She has a history of elevated blood sugars. She is not currently on metformin, and she denies a history of diabetes mellitus.  6. Anemia, unspecified type Kaitlyn Mcdaniel's last hemoglobin level was 9.7. She reports inconsistent use of ferrous sulfate 325 mg.  7. Elevated cholesterol Kaitlyn Mcdaniel's last LDL was 147 on 04/11/2019. She is not currently on statin therapy.  8. Other depression, with emotional eating Kaitlyn Mcdaniel has a history of emotional eating behaviors, and a positive PHQ-9 score.  9. At risk for hypertension The patient is at a higher than average risk of hypertension due to elevated blood pressure.  Assessment/Plan:   1. Other fatigue Kaitlyn Mcdaniel does feel that her weight is causing her energy to be lower than it should be. Fatigue may be related to obesity, depression or many other causes. Labs will be ordered, and  in the meanwhile, Kaitlyn Mcdaniel will focus on self care including making healthy food choices, increasing physical activity and focusing on stress reduction.  - EKG 12-Lead - CBC with Differential/Platelet - T3 - T4, free - TSH - VITAMIN D 25 Hydroxy (Vit-D Deficiency,  Fractures)  2. Shortness of breath on exertion Kaitlyn Mcdaniel does feel that she gets out of breath more easily that she used to when she exercises. Kaitlyn Mcdaniel's shortness of breath appears to be obesity related and exercise induced. She has agreed to work on weight loss and gradually increase exercise to treat her exercise induced shortness of breath. Will continue to monitor closely.  3. Elevated blood pressure reading without diagnosis of hypertension We will check labs today. Kaitlyn Mcdaniel will begin her Category 3 eating plan. We will recheck her blood pressure in 2 weeks.  - Comprehensive metabolic panel  4. B12 deficiency The diagnosis was reviewed with the patient. Counseling provided today, see below. We will continue to monitor. We will check labs today. Orders and follow up as documented in patient record.  - Vitamin B12  5. Hyperglycemia Fasting labs will be obtained today and results with be discussed with Kaitlyn Mcdaniel in 2 weeks at her follow up visit. In the meanwhile Kaitlyn Mcdaniel will start her Category 3 eating plan and will work on weight loss efforts.  - Hemoglobin A1c - Insulin, random  6. Anemia, unspecified type We will check labs today. Orders and follow up as documented in patient record.  - Folate  7. Elevated cholesterol Cardiovascular risk and specific lipid/LDL goals reviewed. We discussed several lifestyle modifications today and Kaitlyn Mcdaniel will start her Category 3 eating plan, and will continue to work on exercise and weight loss efforts. We will check labs today. Orders and follow up as documented in patient record.   - Lipid Panel With LDL/HDL Ratio  8. Other depression, with emotional eating Behavior modification techniques were discussed today to help Kaitlyn Mcdaniel deal with her emotional/non-hunger eating behaviors. We will refer to Dr. Mallie Mussel, our Bariatric Psychologist for evaluation. Orders and follow up as documented in patient record.   9. At risk for hypertension Kaitlyn Mcdaniel was given approximately 15 minutes of hypertension prevention counseling today. Kaitlyn Mcdaniel is at risk for hypertension due to obesity. We discussed intensive lifestyle modifications today with an emphasis on weight loss as well as increasing exercise and decreasing salt intake.  Repetitive spaced learning was employed today to elicit superior memory formation and behavioral change.  10. Class 3 severe obesity with serious comorbidity and body mass index (BMI) of 45.0 to 49.9 in adult, unspecified obesity type (HCC) Narah is currently in the action stage of change and her goal is to continue with weight loss efforts. I recommend Charron begin the structured treatment plan as follows:  She has agreed to the Category 3 Plan.  Exercise goals: As is.   Behavioral modification strategies: increasing lean protein intake, decreasing simple carbohydrates and meal planning and cooking strategies.  She was informed of the importance of frequent follow-up visits to maximize her success with intensive lifestyle modifications for her multiple health conditions. She was informed we would discuss her lab results at her next visit unless there is a critical issue that needs to be addressed sooner. Lillyn agreed to keep her next visit at the agreed upon time to discuss these results.  Objective:   Blood pressure (!) 144/83, pulse 65, temperature 97.9 F (36.6 C), temperature source Oral, height 5\' 4"  (1.626 m), weight 279 lb (126.6 kg),  last menstrual period 11/21/2019, SpO2 98 %. Body mass index is 47.89 kg/m.  EKG: Normal sinus rhythm, rate 71 BPM.  Indirect Calorimeter completed today shows a VO2 of 293 and a REE of 2040.  Her calculated basal metabolic rate is 0000000 thus her basal metabolic rate is better than expected.  General: Cooperative, alert, well developed, in no acute distress. HEENT: Conjunctivae and lids unremarkable. Cardiovascular: Regular rhythm.  Lungs: Normal work of  breathing. Neurologic: No focal deficits.   Lab Results  Component Value Date   CREATININE 0.77 04/11/2019   BUN 16 04/11/2019   NA 141 04/11/2019   K 4.6 04/11/2019   CL 102 04/11/2019   CO2 23 04/11/2019   Lab Results  Component Value Date   ALT 11 04/11/2019   AST 8 04/11/2019   ALKPHOS 95 04/11/2019   BILITOT 0.3 04/11/2019   Lab Results  Component Value Date   HGBA1C 5.9 (H) 02/25/2018   HGBA1C 5.6 10/21/2016   HGBA1C 6.1 (H) 09/05/2015   No results found for: INSULIN Lab Results  Component Value Date   TSH 1.550 04/11/2019   Lab Results  Component Value Date   CHOL 215 (H) 04/11/2019   HDL 47 04/11/2019   LDLCALC 147 (H) 04/11/2019   TRIG 106 04/11/2019   CHOLHDL 4.6 (H) 04/11/2019   Lab Results  Component Value Date   WBC 6.7 04/11/2019   HGB 9.7 (L) 04/11/2019   HCT 32.9 (L) 04/11/2019   MCV 70 (L) 04/11/2019   PLT 264 04/11/2019   Lab Results  Component Value Date   IRON 37 06/08/2019   TIBC 326 06/08/2019   FERRITIN 16 06/08/2019   Attestation Statements:   Reviewed by clinician on day of visit: allergies, medications, problem list, medical history, surgical history, family history, social history, and previous encounter notes.   I, Trixie Dredge, am acting as transcriptionist for Dennard Nip, MD.  I have reviewed the above documentation for accuracy and completeness, and I agree with the above. - Dennard Nip, MD
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
## 2019-11-30 LAB — VITAMIN B12: Vitamin B-12: 277 pg/mL (ref 232–1245)

## 2019-11-30 LAB — CBC WITH DIFFERENTIAL/PLATELET
Basophils Absolute: 0 10*3/uL (ref 0.0–0.2)
Basos: 0 %
EOS (ABSOLUTE): 0 10*3/uL (ref 0.0–0.4)
Eos: 0 %
Hematocrit: 34.7 % (ref 34.0–46.6)
Hemoglobin: 10.3 g/dL — ABNORMAL LOW (ref 11.1–15.9)
Immature Grans (Abs): 0 10*3/uL (ref 0.0–0.1)
Immature Granulocytes: 0 %
Lymphocytes Absolute: 1.7 10*3/uL (ref 0.7–3.1)
Lymphs: 22 %
MCH: 21.8 pg — ABNORMAL LOW (ref 26.6–33.0)
MCHC: 29.7 g/dL — ABNORMAL LOW (ref 31.5–35.7)
MCV: 74 fL — ABNORMAL LOW (ref 79–97)
Monocytes Absolute: 0.4 10*3/uL (ref 0.1–0.9)
Monocytes: 5 %
Neutrophils Absolute: 5.4 10*3/uL (ref 1.4–7.0)
Neutrophils: 73 %
Platelets: 283 10*3/uL (ref 150–450)
RBC: 4.72 x10E6/uL (ref 3.77–5.28)
RDW: 16.9 % — ABNORMAL HIGH (ref 11.7–15.4)
WBC: 7.5 10*3/uL (ref 3.4–10.8)

## 2019-11-30 LAB — T4, FREE: Free T4: 1.21 ng/dL (ref 0.82–1.77)

## 2019-11-30 LAB — LIPID PANEL WITH LDL/HDL RATIO
Cholesterol, Total: 220 mg/dL — ABNORMAL HIGH (ref 100–199)
HDL: 50 mg/dL (ref >39)
LDL Chol Calc (NIH): 147 mg/dL — ABNORMAL HIGH (ref 0–99)
LDL/HDL Ratio: 2.9 ratio (ref 0.0–3.2)
Triglycerides: 126 mg/dL (ref 0–149)
VLDL Cholesterol Cal: 23 mg/dL (ref 5–40)

## 2019-11-30 LAB — COMPREHENSIVE METABOLIC PANEL
ALT: 11 IU/L (ref 0–32)
AST: 11 IU/L (ref 0–40)
Albumin/Globulin Ratio: 1.3 (ref 1.2–2.2)
Albumin: 3.9 g/dL (ref 3.8–4.8)
Alkaline Phosphatase: 108 IU/L (ref 39–117)
BUN/Creatinine Ratio: 20 (ref 9–23)
BUN: 15 mg/dL (ref 6–24)
Bilirubin Total: 0.2 mg/dL (ref 0.0–1.2)
CO2: 23 mmol/L (ref 20–29)
Calcium: 9.1 mg/dL (ref 8.7–10.2)
Chloride: 104 mmol/L (ref 96–106)
Creatinine, Ser: 0.76 mg/dL (ref 0.57–1.00)
GFR calc Af Amer: 107 mL/min/{1.73_m2} (ref >59)
GFR calc non Af Amer: 92 mL/min/{1.73_m2} (ref >59)
Globulin, Total: 2.9 g/dL (ref 1.5–4.5)
Glucose: 123 mg/dL — ABNORMAL HIGH (ref 65–99)
Potassium: 4.3 mmol/L (ref 3.5–5.2)
Sodium: 140 mmol/L (ref 134–144)
Total Protein: 6.8 g/dL (ref 6.0–8.5)

## 2019-11-30 LAB — VITAMIN D 25 HYDROXY (VIT D DEFICIENCY, FRACTURES): Vit D, 25-Hydroxy: 15.1 ng/mL — ABNORMAL LOW (ref 30.0–100.0)

## 2019-11-30 LAB — HEMOGLOBIN A1C
Est. average glucose Bld gHb Est-mCnc: 134 mg/dL
Hgb A1c MFr Bld: 6.3 % — ABNORMAL HIGH (ref 4.8–5.6)

## 2019-11-30 LAB — TSH: TSH: 1.32 u[IU]/mL (ref 0.450–4.500)

## 2019-11-30 LAB — T3: T3, Total: 123 ng/dL (ref 71–180)

## 2019-11-30 LAB — FOLATE: Folate: 5 ng/mL (ref >3.0)

## 2019-11-30 LAB — INSULIN, RANDOM: INSULIN: 22.8 u[IU]/mL (ref 2.6–24.9)

## 2019-12-05 NOTE — Progress Notes (Signed)
Office: [phone]  /  Fax: [phone]    Date: December 06, 2019   Appointment Start Time: 9:00am Duration: 34 minutes Provider: Glennie Isle, Psy.D. Type of Session: Intake for Individual Therapy  Location of Patient: Work Location of Provider: Provider's Home Type of Contact: Telepsychological Visit via [employer]  Informed Consent: Prior to proceeding with today's appointment, two pieces of identifying information were obtained. In addition, Honestie's physical location at the time of this appointment was obtained as well a phone number she could be reached at in the event of technical difficulties. [name] and this provider participated in today's telepsychological service.   The provider's role was explained to [employer] The provider reviewed and discussed issues of confidentiality, privacy, and limits therein (e.g., reporting obligations). In addition to verbal informed consent, written informed consent for psychological services was obtained prior to the initial appointment. Since the clinic is not a 24/7 crisis center, mental health emergency resources were shared and this  provider explained MyChart, e-mail, voicemail, and/or other messaging systems should be utilized only for non-emergency reasons. This provider also explained that information obtained during appointments will be placed in Pamelyn's medical record and relevant information will be shared with other providers at [facility] for coordination of care. Moreover, Laiah agreed information may be shared with other [facility] providers as needed for coordination of care. By signing the service agreement document, Santia provided written consent for coordination of care. Prior to initiating telepsychological services, Angelmarie completed an informed consent document, which included the development of a safety plan (i.e., an emergency contact, nearest emergency room, and emergency resources)  in the event of an emergency/crisis. Debralee expressed understanding of the rationale of the safety plan. Emanuelle verbally acknowledged understanding she is ultimately responsible for understanding her insurance benefits for telepsychological and in-person services. This provider also reviewed confidentiality, as it relates to telepsychological services, as well as the rationale for telepsychological services (i.e., to reduce exposure risk to COVID-19). Azoria  acknowledged understanding that appointments cannot be recorded without both party consent and she is aware she is responsible for securing confidentiality on her end of the session. Elizabella verbally consented to proceed.  Of note, Daje reported she was notified this provider would be unable to meet with her and her daughter for services. Due to HIPAA, this provider explained she could neither confirm nor deny the aforementioned; however, this provider explained her commitment to the ethics code as it relates to dual relationships, and also discussed providers within the clinic can refer patients to other mental health providers as deemed appropriate. She acknowledged understanding.   Chief Complaint/HPI: Pammie was referred by Dr. Dennard Nip due to other depression, with emotional eating. Per the note for the initial visit with Dr. Dennard Nip on November 29, 2019, "Cynthya has a history of emotional eating behaviors, and a positive PHQ-9 score." The note for the initial appointment with Dr. Dennard Nip November 29, 2019 indicated the following: "Ronnita's habits were reviewed today and are as follows: Her family eats meals together, she thinks her family will eat healthier with her, her desired weight loss is 109 lbs, she has been heavy most of her life, she started gaining weight in her early 20's, her heaviest weight ever was 283 pounds, she has significant food cravings issues, she snacks frequently in the evenings, she skips meals frequently,  she is frequently drinking liquids with calories, she frequently makes poor food choices, she frequently eats larger portions  than normal and she struggles with emotional eating." Oaklynn's Food and Mood (modified PHQ-9) score on November 29, 2019 was 13.  During today's appointment, Shayra was verbally administered a questionnaire assessing various behaviors related to emotional eating. Opal endorsed the following: overeat when you are celebrating, experience food cravings on a regular basis, overeat frequently when you are bored or lonely, not worry about what you eat when you are in a good mood and eat as a reward. She shared she craves sugar (e.g., soda). Lilya believes the onset of emotional eating was likely in childhood and described the current frequency of emotional eating as "a few times a month." She noted a recent reduction in mindless snacking. In addition, Dayleen denied a history of binge eating. Leotha denied a history of restricting food intake, purging and engagement in other compensatory strategies, and has never been diagnosed with an eating disorder. She also denied a history of treatment for emotional eating. Moreover, Lashika indicated boredom and celebrations trigger emotional eating, whereas crocheting and increased awareness makes emotional eating better. Furthermore, [name] denied other problems of concern.    Mental Status Examination:  Appearance: well groomed and appropriate hygiene  Behavior: appropriate to circumstances Mood: euthymic Affect: mood congruent Speech: normal in rate, volume, and tone Eye Contact: appropriate Psychomotor Activity: appropriate Gait: unable to assess Thought Process: linear, logical, and goal directed  Thought Content/Perception: denies suicidal and homicidal ideation, plan, and intent and no hallucinations, delusions, bizarre thinking or behavior reported or observed Orientation: time, person, place and purpose of  appointment Memory/Concentration: memory, attention, language, and fund of knowledge intact  Insight/Judgment: good  Family & Psychosocial History: Love reported she is married and she has two daughters (ages 37 and 34). 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 Currently, Korrine's social support system consists of her husband, daughters, sister, and close network of friends. Moreover, Mignonne stated she resides with her husband and 42 year old daughter. She added her mother resides with her 6 months out of the year.    Medical History:  Past Medical History:  Diagnosis Date  . Anemia   . Asthma   . Back pain   . Dependent edema   . Dyspnea   . GERD (gastroesophageal reflux disease)   . Joint pain   . Osteoarthritis   . S/P TKR (total knee replacement), bilateral   . Sleep apnea   . Vitamin B 12 deficiency    Past Surgical History:  Procedure Laterality Date  . BREAST REDUCTION SURGERY  1999  . CERVIX LESION DESTRUCTION  1998   abnormal PAP  . REDUCTION MAMMAPLASTY Bilateral 1999  . TOTAL KNEE ARTHROPLASTY Left 06/2016  . TOTAL KNEE ARTHROPLASTY Right 09/2016   Current Outpatient Medications on File Prior to Visit  Medication Sig Dispense Refill  . acetaminophen (TYLENOL) 500 MG tablet Take 1,000 mg by mouth every 6 (six) hours as needed.    . Albuterol Sulfate (PROAIR RESPICLICK) 149 (90 Base) MCG/ACT AEPB Inhale 2 puffs into the lungs 2 (two) times daily as needed. 3 each 3  . cetirizine (ZYRTEC) 10 MG tablet Take 10 mg by mouth daily as needed for allergies.    . ferrous sulfate 325 (65 FE) MG tablet Take 325 mg by mouth daily with breakfast.    . ibuprofen (ADVIL) 800  MG tablet Take 800 mg by mouth every 6 (six) hours as needed.     . naproxen sodium (ALEVE)  220 MG tablet Take 220 mg by mouth daily as needed.    . NON FORMULARY CPAP at night    . omeprazole (PRILOSEC) 20 MG capsule TAKE 1 CAPSULE BY MOUTH 2 TIMES DAILY BEFORE A MEAL. 180 capsule 3  . PREBIOTIC PRODUCT PO Take 1 capsule by mouth daily as needed.    . Probiotic Product (PROBIOTIC-10 PO) Take 1 capsule by mouth daily as needed.    . vitamin B-12 (CYANOCOBALAMIN) 500 MCG tablet Take 500 mcg by mouth daily as needed.     No current facility-administered medications on file prior to visit.  Yarelie denied a history of head injuries and loss of consciousness.    Mental Health History: Marlette reported there is no history of therapeutic services. Niyati reported there is no history of hospitalizations for psychiatric concerns, and she has never met with a psychiatrist. Jereline stated she has never been prescribed psychotropic medications. Joette endorsed a family history of mental health related concerns. She stated her sister suffers from depression and her mother is diagnosed with bipolar disorder. Aairah reported there is no history of trauma including psychological, physical  and sexual abuse, as well as neglect.   Tuesday described her typical mood lately as "pretty even keel." Aside from concerns noted above and endorsed on the PHQ-9 and GAD-7, Nakina reported experiencing "reverse body image," noting she is not focused on the number on the scale, rather her desire to lose weight is related to her overall well-being. Mandisa endorsed current alcohol use, noting she consumes approximately one standard drink every couple weeks. She denied tobacco use. She denied illicit/recreational substance use. Regarding caffeine intake, Kareli reported consuming one can of soda daily. Furthermore, [name] indicated she is not experiencing the following: hallucinations and delusions, paranoia, symptoms of mania , social withdrawal, crying spells, panic attacks and decreased motivation. She also denied  history of and current suicidal ideation, plan, and intent; history of and current homicidal ideation, plan, and intent; and history of and current engagement in self-harm.  The following strengths were reported by [name]: "even nature," "creative,"openness to change," "pretty quick to grasp new concepts," caring and "good sense of humor." The following strengths were observed by this provider: ability to express thoughts and feelings during the therapeutic session, ability to establish and benefit from a therapeutic relationship, willingness to work toward established goal(s) with the clinic and ability to engage in reciprocal conversation.  Legal History: Carely reported there is no history of legal involvement.   Structured Assessments Results: The Patient Health Questionnaire-9 (PHQ-9) is a self-report measure that assesses symptoms and severity of depression over the course of the last two weeks. [name] obtained a score of 3 suggesting minimal depression. Farrie finds the endorsed symptoms to be not difficult at all. [0= Not at all; 1= Several days; 2= More than half the days; 3= Nearly every day] Little interest or pleasure in doing things 0  Feeling down, depressed, or hopeless 0  Trouble falling or staying asleep, or sleeping too much 1  Feeling tired or having little energy 1  Poor appetite or overeating 0  Feeling bad about yourself --- or that you are a failure or have let yourself or your family down 0  Trouble concentrating on things, such as reading the newspaper or watching television 1  Moving or speaking so slowly that other people could have  noticed? Or the opposite --- being so fidgety or restless that you have been moving around a lot more than usual 0  Thoughts that you would be better off dead or hurting yourself in some way 0  PHQ-9 Score 3    The Generalized Anxiety Disorder-7 (GAD-7) is a brief self-report measure that assesses symptoms of anxiety over the course of  the last two weeks. Harlo obtained a score of 1 suggesting minimal anxiety. Stephanny finds the endorsed symptoms to be not difficult at all. [0= Not at all; 1= Several days; 2= Over half the days; 3= Nearly every day] Feeling nervous, anxious, on edge 0  Not being able to stop or control worrying 0  Worrying too much about different things 0  Trouble relaxing 0  Being so restless that it's hard to sit still 0  Becoming easily annoyed or irritable 1  Feeling afraid as if something awful might happen 0  GAD-7 Score 1   Interventions:  Conducted a chart review Focused on rapport building Verbally administered PHQ-9 and GAD-7 for symptom monitoring Verbally administered Food & Mood questionnaire to assess various behaviors related to emotional eating. Provided emphatic reflections and validation Psychoeducation provided regarding physical versus emotional hunger  Provisional DSM-5 Diagnosis(es): 311 (F32.8) Other Specified Depressive Disorder, Emotional Eating Behaviors  Plan: Melania declined future appointments with this provider at this time, noting things are going well her current meal plan. She acknowledged understanding that she may request a follow-up appointment with this provider in the future as long as she is still established with the clinic. Tkeyah will be sent a handout via e-mail to increase awareness of hunger patterns and subsequent eating. [name] provided verbal consent during today's appointment for this provider to send the handout via e-mail. No further follow-up planned by this provider.

## 2019-12-06 ENCOUNTER — Ambulatory Visit (INDEPENDENT_AMBULATORY_CARE_PROVIDER_SITE_OTHER): Payer: No Typology Code available for payment source | Admitting: Psychology

## 2019-12-06 ENCOUNTER — Encounter: Payer: Self-pay | Admitting: Internal Medicine

## 2019-12-06 ENCOUNTER — Other Ambulatory Visit: Payer: Self-pay

## 2019-12-06 DIAGNOSIS — F3289 Other specified depressive episodes: Secondary | ICD-10-CM

## 2019-12-07 ENCOUNTER — Other Ambulatory Visit: Payer: Self-pay

## 2019-12-07 ENCOUNTER — Other Ambulatory Visit: Payer: Self-pay | Admitting: Internal Medicine

## 2019-12-07 DIAGNOSIS — R42 Dizziness and giddiness: Secondary | ICD-10-CM

## 2019-12-08 ENCOUNTER — Ambulatory Visit (INDEPENDENT_AMBULATORY_CARE_PROVIDER_SITE_OTHER): Payer: No Typology Code available for payment source | Admitting: Psychology

## 2019-12-13 ENCOUNTER — Encounter (INDEPENDENT_AMBULATORY_CARE_PROVIDER_SITE_OTHER): Payer: Self-pay | Admitting: Family Medicine

## 2019-12-13 ENCOUNTER — Other Ambulatory Visit: Payer: Self-pay

## 2019-12-13 ENCOUNTER — Ambulatory Visit (INDEPENDENT_AMBULATORY_CARE_PROVIDER_SITE_OTHER): Payer: No Typology Code available for payment source | Admitting: Family Medicine

## 2019-12-13 VITALS — BP 134/84 | HR 82 | Temp 97.8°F | Ht 64.0 in | Wt 271.0 lb

## 2019-12-13 DIAGNOSIS — E538 Deficiency of other specified B group vitamins: Secondary | ICD-10-CM

## 2019-12-13 DIAGNOSIS — E7849 Other hyperlipidemia: Secondary | ICD-10-CM

## 2019-12-13 DIAGNOSIS — D509 Iron deficiency anemia, unspecified: Secondary | ICD-10-CM | POA: Diagnosis not present

## 2019-12-13 DIAGNOSIS — Z9189 Other specified personal risk factors, not elsewhere classified: Secondary | ICD-10-CM | POA: Diagnosis not present

## 2019-12-13 DIAGNOSIS — E559 Vitamin D deficiency, unspecified: Secondary | ICD-10-CM

## 2019-12-13 DIAGNOSIS — R7303 Prediabetes: Secondary | ICD-10-CM

## 2019-12-13 DIAGNOSIS — Z6841 Body Mass Index (BMI) 40.0 and over, adult: Secondary | ICD-10-CM

## 2019-12-14 MED ORDER — VITAMIN D (ERGOCALCIFEROL) 1.25 MG (50000 UNIT) PO CAPS: 50000.0000 [IU] | ORAL_CAPSULE | ORAL | 0 refills | Status: DC

## 2019-12-14 MED ORDER — VITAMIN B-12 1000 MCG PO TABS: 1000.0000 ug | ORAL_TABLET | Freq: Every day | ORAL | 0 refills | Status: DC

## 2019-12-14 MED FILL — VIT D2 1.25 MG (50,000 UNIT: 1.25 MG | 28 days supply | Qty: 4 | Fill #0

## 2019-12-14 NOTE — Progress Notes (Signed)
Chief Complaint:   OBESITY Kaitlyn Mcdaniel is here to discuss her progress with her obesity treatment plan along with follow-up of her obesity related diagnoses. Kaitlyn Mcdaniel is on the Category 3 Plan and states she is following her eating plan approximately 90% of the time. Kaitlyn Mcdaniel states she is doing 0 minutes 0 times per week.  Today's visit was #: 2 Starting weight: 279 lbs Starting date: 11/29/2019 Today's weight: 271 lbs Today's date: 12/13/2019 Total lbs lost to date: 8 Total lbs lost since last in-office visit: 8  Interim History: Kaitlyn Mcdaniel is missing the fat/oil in her daily meals. She was not hungry at all and states she was almost nausea with dinner. She used 150 snack calories for soda/Pepsi. She was offered the lower calorie mayo options. She estimates to drink 20-25 oz of sparkling water daily. I recommended increasing plain flat water intake.  Subjective:   1. Pre-diabetes Kaitlyn Mcdaniel's A1c on 11/29/2019 was 6.3. Her level is worsening. Due to fullness, will defer starting metformin. I discussed labs with the patient today.  2. Other hyperlipidemia Kaitlyn Mcdaniel's lipid panel on 11/29/2019 showed a total cholesterol of 220, triglycerides 126, HDL 50, and LDL 147. She is not currently on statin therapy. I discussed labs with the patient today.  3. B12 deficiency Kaitlyn Mcdaniel's B12 level on 11/29/2019 was 277. I recommended restarting OTC supplementation. She has not had intestinal or abdominal surgeries. I discussed labs with the patient today.  4. Chronic iron deficiency anemia Kaitlyn Mcdaniel's CBC on 11/29/2019 showed hemoglobin/hematocrit was stable for her. She reports sporadic menses that will range from light to heavy. I discussed labs with the patient today.  5. Vitamin D deficiency Kaitlyn Mcdaniel's Vit D level on 11/29/2019 was 15.1. I recommended starting prescription strength Vit D supplementation.  6. At risk for diabetes mellitus Kaitlyn Mcdaniel is at higher than average risk for developing diabetes due to  rising A1c.   Assessment/Plan:   1. Pre-diabetes Kaitlyn Mcdaniel will continue her Category 3 meal plan, and will continue to work on weight loss, exercise, and decreasing simple carbohydrates to help decrease the risk of diabetes. We will recheck labs in 3 months.  2. Other hyperlipidemia Cardiovascular risk and specific lipid/LDL goals reviewed. We discussed several lifestyle modifications today. Kaitlyn Mcdaniel will continue her Category 3 meal plan, and will continue to work on exercise and weight loss efforts. We will recheck labs in 3 months. Orders and follow up as documented in patient record.   3. B12 deficiency The diagnosis was reviewed with the patient. We will continue to monitor. Kaitlyn Mcdaniel agreed to start OTC B12 1,000 mcg q daily. We will recheck labs in 3 months. Orders and follow up as documented in patient record.  - vitamin B-12 (CYANOCOBALAMIN) 1000 MCG tablet; Take 1 tablet (1,000 mcg total) by mouth daily.  Dispense: 30 tablet; Refill: 0  4. Chronic iron deficiency anemia Kaitlyn Mcdaniel agreed to continue ferrous sulfate 325 mg (65 FE) without breakfast. Orders and follow up as documented in patient record.  5. Vitamin D deficiency Low Vitamin D level contributes to fatigue and are associated with obesity, breast, and colon cancer. Kaitlyn Mcdaniel agreed to start prescription Vitamin D 50,000 IU every week with no refills. She will follow-up for routine testing of Vitamin D, at least 2-3 times per year to avoid over-replacement.  - Vitamin D, Ergocalciferol, (DRISDOL) 1.25 MG (50000 UNIT) CAPS capsule; Take 1 capsule (50,000 Units total) by mouth every 7 (seven) days.  Dispense: 4 capsule; Refill: 0  6. At risk for  diabetes mellitus Kaitlyn Mcdaniel was given approximately 30 minutes of diabetes education and counseling today. We discussed intensive lifestyle modifications today with an emphasis on weight loss as well as increasing exercise and decreasing simple carbohydrates in her diet. We also reviewed  medication options with an emphasis on risk versus benefit of those discussed.   Repetitive spaced learning was employed today to elicit superior memory formation and behavioral change.  7. Class 3 severe obesity with serious comorbidity and body mass index (BMI) of 45.0 to 49.9 in adult, unspecified obesity type (HCC) Kaitlyn Mcdaniel is currently in the action stage of change. As such, her goal is to continue with weight loss efforts. She has agreed to the Category 3 Plan with additional lunch options.   Exercise goals: No exercise has been prescribed at this time.  Behavioral modification strategies: increasing lean protein intake, decreasing simple carbohydrates, increasing water intake and no skipping meals.  Kaitlyn Mcdaniel has agreed to follow-up with our clinic in 2 weeks. She was informed of the importance of frequent follow-up visits to maximize her success with intensive lifestyle modifications for her multiple health conditions.   Objective:   Blood pressure 134/84, pulse 82, temperature 97.8 F (36.6 C), temperature source Oral, height 5\' 4"  (1.626 m), weight 271 lb (122.9 kg), last menstrual period 11/21/2019, SpO2 98 %. Body mass index is 46.52 kg/m.  General: Cooperative, alert, well developed, in no acute distress. HEENT: Conjunctivae and lids unremarkable. Cardiovascular: Regular rhythm.  Lungs: Normal work of breathing. Neurologic: No focal deficits.   Lab Results  Component Value Date   CREATININE 0.76 11/29/2019   BUN 15 11/29/2019   NA 140 11/29/2019   K 4.3 11/29/2019   CL 104 11/29/2019   CO2 23 11/29/2019   Lab Results  Component Value Date   ALT 11 11/29/2019   AST 11 11/29/2019   ALKPHOS 108 11/29/2019   BILITOT 0.2 11/29/2019   Lab Results  Component Value Date   HGBA1C 6.3 (H) 11/29/2019   HGBA1C 5.9 (H) 02/25/2018   HGBA1C 5.6 10/21/2016   HGBA1C 6.1 (H) 09/05/2015   Lab Results  Component Value Date   INSULIN 22.8 11/29/2019   Lab Results  Component  Value Date   TSH 1.320 11/29/2019   Lab Results  Component Value Date   CHOL 220 (H) 11/29/2019   HDL 50 11/29/2019   LDLCALC 147 (H) 11/29/2019   TRIG 126 11/29/2019   CHOLHDL 4.6 (H) 04/11/2019   Lab Results  Component Value Date   WBC 7.5 11/29/2019   HGB 10.3 (L) 11/29/2019   HCT 34.7 11/29/2019   MCV 74 (L) 11/29/2019   PLT 283 11/29/2019   Lab Results  Component Value Date   IRON 37 06/08/2019   TIBC 326 06/08/2019   FERRITIN 16 06/08/2019   Attestation Statements:   Reviewed by clinician on day of visit: allergies, medications, problem list, medical history, surgical history, family history, social history, and previous encounter notes.   I, Trixie Dredge, am acting as transcriptionist for Dennard Nip, MD.  I have reviewed the above documentation for accuracy and completeness, and I agree with the above. -  Dennard Nip, MD

## 2019-12-28 ENCOUNTER — Encounter (INDEPENDENT_AMBULATORY_CARE_PROVIDER_SITE_OTHER): Payer: Self-pay | Admitting: Bariatrics

## 2019-12-28 ENCOUNTER — Ambulatory Visit (INDEPENDENT_AMBULATORY_CARE_PROVIDER_SITE_OTHER): Payer: No Typology Code available for payment source | Admitting: Bariatrics

## 2019-12-28 ENCOUNTER — Other Ambulatory Visit: Payer: Self-pay

## 2019-12-28 VITALS — BP 127/82 | HR 69 | Temp 97.9°F | Ht 64.0 in | Wt 273.0 lb

## 2019-12-28 DIAGNOSIS — R7303 Prediabetes: Secondary | ICD-10-CM

## 2019-12-28 DIAGNOSIS — Z6841 Body Mass Index (BMI) 40.0 and over, adult: Secondary | ICD-10-CM

## 2019-12-28 DIAGNOSIS — E559 Vitamin D deficiency, unspecified: Secondary | ICD-10-CM | POA: Diagnosis not present

## 2019-12-28 MED ORDER — VITAMIN D (ERGOCALCIFEROL) 1.25 MG (50000 UNIT) PO CAPS: 50000.0000 [IU] | ORAL_CAPSULE | ORAL | 0 refills | Status: DC

## 2019-12-28 NOTE — Progress Notes (Signed)
Chief Complaint:   Kaitlyn Mcdaniel is here to discuss her progress with her obesity treatment plan along with follow-up of her obesity related diagnoses. Kaitlyn Mcdaniel is on the Category 3 Plan and states she is following her eating plan approximately 70% of the time. Kaitlyn Mcdaniel states she is doing yard work 45 minutes 5 times per week.  Today's visit was #: 3 Starting weight: 279 lbs Starting date: 11/29/2019 Today's weight: 273 lbs Today's date: 12/28/2019 Total lbs lost to date: 6 Total lbs lost since last in-office visit: 0  Interim History: Kaitlyn Mcdaniel has been installing a fence at home and the posts are in. Due to a hectic work schedule and yard work, she has been consuming more fast food. She will be traveling to the [place] in a few weeks to celebrate her birthday. We discussed travel eating strategies.  Subjective:   Vitamin D deficiency. Last Vitamin D 15.1 on 11/29/2019. Kaitlyn Mcdaniel is on prescription strength Vitamin D supplementation.  Prediabetes. Kaitlyn Mcdaniel has a diagnosis of prediabetes based on her elevated HgA1c and was informed this puts her at greater risk of developing diabetes. She continues to work on diet and exercise to decrease her risk of diabetes. She denies nausea or hypoglycemia. Kaitlyn Mcdaniel is not on metformin.  Lab Results  Component Value Date   HGBA1C 6.3 (H) 11/29/2019   Lab Results  Component Value Date   INSULIN 22.8 11/29/2019   Assessment/Plan:   Vitamin D deficiency. Low Vitamin D level contributes to fatigue and are associated with obesity, breast, and colon cancer. She agrees to continue to take prescription Vitamin D @50 ,000 IU every week #4 with 0 refills and will follow-up for routine testing of Vitamin D, at least 2-3 times per year to avoid over-replacement.  Prediabetes. Kaitlyn Mcdaniel will continue to work on weight loss, exercise, and decreasing simple carbohydrates to help decrease the risk of diabetes. She will continue the Category 3  meal plan. Will check A1c and insulin levels in 3 months.  Class 3 severe obesity with serious comorbidity and body mass index (BMI) of 45.0 to 49.9 in adult, unspecified obesity type ([place]).  Kaitlyn Mcdaniel is currently in the action stage of change. As such, her goal is to continue with weight loss efforts. She has agreed to the Category 3 Plan and will journal 450-600 calories and 40 grams of protein at supper with additional dinner options.   Exercise goals: Kaitlyn Mcdaniel will continue her current exercise regimen.  Behavioral modification strategies: increasing water intake, decreasing eating out, meal planning and cooking strategies and travel eating strategies.  Kaitlyn Mcdaniel has agreed to follow-up with our clinic in 3 weeks. She was informed of the importance of frequent follow-up visits to maximize her success with intensive lifestyle modifications for her multiple health conditions.   Objective:   Blood pressure 127/82, pulse 69, temperature 97.9 F (36.6 C), height 5\' 4"  (1.626 m), weight 273 lb (123.8 kg), SpO2 99 %. Body mass index is 46.86 kg/m.  General: Cooperative, alert, well developed, in no acute distress. HEENT: Conjunctivae and lids unremarkable. Cardiovascular: Regular rhythm.  Lungs: Normal work of breathing. Neurologic: No focal deficits.   Lab Results  Component Value Date   CREATININE 0.76 11/29/2019   BUN 15 11/29/2019   NA 140 11/29/2019   K 4.3 11/29/2019   CL 104 11/29/2019   CO2 23 11/29/2019   Lab Results  Component Value Date   ALT 11 11/29/2019   AST 11 11/29/2019   ALKPHOS 108  11/29/2019   BILITOT 0.2 11/29/2019   Lab Results  Component Value Date   HGBA1C 6.3 (H) 11/29/2019   HGBA1C 5.9 (H) 02/25/2018   HGBA1C 5.6 10/21/2016   HGBA1C 6.1 (H) 09/05/2015   Lab Results  Component Value Date   INSULIN 22.8 11/29/2019   Lab Results  Component Value Date   TSH 1.320 11/29/2019   Lab Results  Component Value Date   CHOL 220 (H) 11/29/2019   HDL 50  11/29/2019   LDLCALC 147 (H) 11/29/2019   TRIG 126 11/29/2019   CHOLHDL 4.6 (H) 04/11/2019   Lab Results  Component Value Date   WBC 7.5 11/29/2019   HGB 10.3 (L) 11/29/2019   HCT 34.7 11/29/2019   MCV 74 (L) 11/29/2019   PLT 283 11/29/2019   Lab Results  Component Value Date   IRON 37 06/08/2019   TIBC 326 06/08/2019   FERRITIN 16 06/08/2019   Attestation Statements:   Reviewed by clinician on day of visit: allergies, medications, problem list, medical history, surgical history, family history, social history, and previous encounter notes.  Migdalia Dk, am acting as Location manager for [employer], DO   I have reviewed the above documentation for accuracy and completeness, and I agree with the above. -  mec

## 2020-01-09 MED FILL — VIT D2 1.25 MG (50,000 UNIT: 1.25 MG | 28 days supply | Qty: 4 | Fill #0

## 2020-01-19 ENCOUNTER — Ambulatory Visit (INDEPENDENT_AMBULATORY_CARE_PROVIDER_SITE_OTHER): Payer: No Typology Code available for payment source | Admitting: Family Medicine

## 2020-01-30 MED FILL — VIT D2 1.25 MG (50,000 UNIT: 1.25 MG | 28 days supply | Qty: 4 | Fill #0

## 2020-02-17 ENCOUNTER — Other Ambulatory Visit (HOSPITAL_COMMUNITY): Payer: Self-pay | Admitting: Acute Care

## 2020-02-17 ENCOUNTER — Other Ambulatory Visit: Payer: Self-pay | Admitting: Acute Care

## 2020-02-17 DIAGNOSIS — R42 Dizziness and giddiness: Secondary | ICD-10-CM

## 2020-02-27 ENCOUNTER — Other Ambulatory Visit: Payer: Self-pay

## 2020-02-27 ENCOUNTER — Encounter: Payer: Self-pay | Admitting: Internal Medicine

## 2020-02-27 DIAGNOSIS — R42 Dizziness and giddiness: Secondary | ICD-10-CM

## 2020-02-29 ENCOUNTER — Other Ambulatory Visit: Payer: Self-pay

## 2020-02-29 ENCOUNTER — Ambulatory Visit (HOSPITAL_COMMUNITY)
Admission: RE | Admit: 2020-02-29 | Discharge: 2020-02-29 | Disposition: A | Discharge status: Home / Self Care | Payer: No Typology Code available for payment source | Source: Ambulatory Visit | Attending: Acute Care | Admitting: Acute Care

## 2020-02-29 DIAGNOSIS — R42 Dizziness and giddiness: Secondary | ICD-10-CM | POA: Diagnosis present

## 2020-04-10 NOTE — Progress Notes (Signed)
Date:  04/11/2020   Name:  Kaitlyn Mcdaniel   DOB:  09-Jun-1971   MRN:  [id number]   Chief Complaint: Annual Exam (breast exam no pap feels well, sleeps fairly well) and Colon Cancer Screening (FIT)  Kaitlyn Mcdaniel is a 49 y.o. female who presents today for her Complete Annual Exam. She feels well. She reports exercising rarely. She reports she is sleeping well. Breast complaints none. She is dieting using Optivia (?) - low calorie snacks and one lean and green meal.  Her menses continue but are irregular and heavy at times.  Recent pelvic US was normal.  Mammogram: 04/2019 DEXA: none Pap smear: 08/2015 thin prep Colonoscopy: none. She would prefer Cologuard  Immunization History  Administered Date(s) Administered  . Influenza-Unspecified 06/30/2015, 06/13/2017, 06/17/2019  . PFIZER SARS-COV-2 Vaccination 09/07/2019, 10/06/2019  . Tdap 09/15/2009    Gastroesophageal Reflux She complains of heartburn and wheezing. She reports no abdominal pain, no chest pain or no coughing. The problem occurs rarely. Pertinent negatives include no fatigue. She has tried a PPI for the symptoms. The treatment provided significant relief.  Asthma She complains of wheezing. There is no cough or shortness of breath. This is a recurrent problem. The problem occurs intermittently (seasonally). Associated symptoms include heartburn. Pertinent negatives include no chest pain, fever, headaches or trouble swallowing. Her symptoms are aggravated by change in weather. Her symptoms are alleviated by beta-agonist. Her past medical history is significant for asthma.    Lab Results  Component Value Date   CREATININE 0.76 11/29/2019   BUN 15 11/29/2019   NA 140 11/29/2019   K 4.3 11/29/2019   CL 104 11/29/2019   CO2 23 11/29/2019   Lab Results  Component Value Date   CHOL 220 (H) 11/29/2019   HDL 50 11/29/2019   LDLCALC 147 (H) 11/29/2019   TRIG 126 11/29/2019   CHOLHDL 4.6 (H) 04/11/2019   Lab Results    Component Value Date   TSH 1.320 11/29/2019   Lab Results  Component Value Date   HGBA1C 6.3 (H) 11/29/2019   Lab Results  Component Value Date   WBC 7.5 11/29/2019   HGB 10.3 (L) 11/29/2019   HCT 34.7 11/29/2019   MCV 74 (L) 11/29/2019   PLT 283 11/29/2019   Lab Results  Component Value Date   ALT 11 11/29/2019   AST 11 11/29/2019   ALKPHOS 108 11/29/2019   BILITOT 0.2 11/29/2019     Review of Systems  Constitutional: Negative for chills, fatigue and fever.  HENT: Negative for congestion, hearing loss, tinnitus, trouble swallowing and voice change.   Eyes: Negative for visual disturbance.  Respiratory: Positive for wheezing. Negative for cough, chest tightness and shortness of breath.   Cardiovascular: Negative for chest pain, palpitations and leg swelling.  Gastrointestinal: Positive for heartburn. Negative for abdominal pain, constipation, diarrhea and vomiting.  Endocrine: Negative for polydipsia and polyuria.  Genitourinary: Negative for dysuria, frequency, genital sores, vaginal bleeding and vaginal discharge.  Musculoskeletal: Negative for arthralgias, gait problem and joint swelling.  Skin: Negative for color change and rash.  Neurological: Negative for dizziness, tremors, light-headedness and headaches.  Hematological: Negative for adenopathy. Does not bruise/bleed easily.  Psychiatric/Behavioral: Negative for dysphoric mood and sleep disturbance. The patient is not nervous/anxious.     Patient Active Problem List   Diagnosis Date Noted  . B12 deficiency anemia 06/09/2019  . Obesity 11/26/2018  . Abnormal uterine bleeding 11/26/2018  . Situational anxiety 03/27/2018  . Chronic  iron deficiency anemia 03/26/2018  . Mixed hyperlipidemia 02/24/2018  . 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 Status post total knee replacement, left 09/02/2016  . Elevated blood sugar 09/05/2015  . OSA (obstructive sleep apnea) 08/06/2015  . Exercise-induced  asthma 04/29/2015  . GERD without esophagitis 04/29/2015  . Hx of abnormal cervical Papanicolaou smear 04/29/2015  . Degenerative arthritis of knee, bilateral 03/08/2015    No Known Allergies  Past Surgical History:  Procedure Laterality Date  . BREAST REDUCTION SURGERY  1999  . CERVIX LESION DESTRUCTION  1998   abnormal PAP  . REDUCTION MAMMAPLASTY Bilateral 1999  . TOTAL KNEE ARTHROPLASTY Left 06/2016  . TOTAL KNEE ARTHROPLASTY Right 09/2016    Social History   Tobacco Use  . Smoking status: Never Smoker  . Smokeless tobacco: Never Used  Vaping Use  . Vaping Use: Never used  Substance Use Topics  . Alcohol use: Yes    Alcohol/week: 1.0 standard drink    Types: 1 Glasses of wine per week  . Drug use: No     Medication list has been reviewed and updated.  Current Meds  Medication Sig  . acetaminophen (TYLENOL) 500 MG tablet Take 1,000 mg by mouth every 6 (six) hours as needed.  . Albuterol Sulfate (PROAIR RESPICLICK) 270 (90 Base) MCG/ACT AEPB Inhale 2 puffs into the lungs 2 (two) times daily as needed.  . cetirizine (ZYRTEC) 10 MG tablet Take 10 mg by mouth daily as needed for allergies.  . NON FORMULARY CPAP at night  . omeprazole (PRILOSEC) 20 MG capsule TAKE 1 CAPSULE BY MOUTH 2 TIMES DAILY BEFORE A MEAL.  . Probiotic Product (PROBIOTIC-10 PO) Take 1 capsule by mouth daily as needed.  . vitamin B-12 (CYANOCOBALAMIN) 1000 MCG tablet Take 1 tablet (1,000 mcg total) by mouth daily.  . [DISCONTINUED] ferrous sulfate 325 (65 FE) MG tablet Take 325 mg by mouth daily with breakfast.    PHQ 2/9 Scores 04/11/2020 11/29/2019 04/11/2019 11/16/2018  PHQ - 2 Score 0 3 0 0  PHQ- 9 Score 0 13 2 -    GAD 7 : Generalized Anxiety Score 04/11/2020  Nervous, Anxious, on Edge 0  Control/stop worrying 0  Worry too much - different things 0  Trouble relaxing 0  Restless 0  Easily annoyed or irritable 0  Afraid - awful might happen 0  Total GAD 7 Score 0  Anxiety Difficulty Not  difficult at all    BP Readings from Last 3 Encounters:  04/11/20 126/82  12/28/19 127/82  12/13/19 134/84    Physical Exam Vitals and nursing note reviewed.  Constitutional:      General: She is not in acute distress.    Appearance: She is well-developed.  HENT:     Head: Normocephalic and atraumatic.     Right Ear: Tympanic membrane and ear canal normal.     Left Ear: Tympanic membrane and ear canal normal.     Nose:     Right Sinus: No maxillary sinus tenderness.     Left Sinus: No maxillary sinus tenderness.  Eyes:     General: No scleral icterus.       Right eye: No discharge.        Left eye: No discharge.     Conjunctiva/sclera: Conjunctivae normal.  Neck:     Thyroid: No thyromegaly.     Vascular: No carotid bruit.  Cardiovascular:     Rate and Rhythm: Normal rate and regular rhythm.  Pulses: Normal pulses.     Heart sounds: Normal heart sounds.  Pulmonary:     Effort: Pulmonary effort is normal. No respiratory distress.     Breath sounds: No wheezing.  Chest:     Breasts:        Right: No mass, nipple discharge, skin change or tenderness.        Left: No mass, nipple discharge, skin change or tenderness.     Comments: Breast reduction scars bilaterally Abdominal:     General: Bowel sounds are normal.     Palpations: Abdomen is soft.     Tenderness: There is no abdominal tenderness.  Genitourinary:    Labia:        Right: No tenderness or lesion.        Left: No tenderness or lesion.      Vagina: Normal.     Cervix: Friability and cervical bleeding (dark menstrual blood) present.     Uterus: Normal.      Adnexa: Right adnexa normal and left adnexa normal.  Musculoskeletal:     Cervical back: Normal range of motion. No erythema.     Right lower leg: No edema.     Left lower leg: No edema.  Lymphadenopathy:     Cervical: No cervical adenopathy.  Skin:    General: Skin is warm and dry.     Findings: No rash.  Neurological:     Mental Status: She  is alert and oriented to person, place, and time.     Cranial Nerves: No cranial nerve deficit.     Sensory: No sensory deficit.     Deep Tendon Reflexes: Reflexes are normal and symmetric.  Psychiatric:        Attention and Perception: Attention normal.        Mood and Affect: Mood normal.     Wt Readings from Last 3 Encounters:  04/11/20 (!) 255 lb (115.7 kg)  12/28/19 273 lb (123.8 kg)  12/13/19 271 lb (122.9 kg)    BP 126/82   Pulse 70   Temp 98 F (36.7 C) (Oral)   Ht 5\' 4"  (1.626 m)   Wt (!) 255 lb (115.7 kg)   LMP 04/07/2020   SpO2 98%   BMI 43.77 kg/m   Assessment and Plan: 1. Annual physical exam Continue to work on weight loss Exercise recommended - CBC with Differential/Platelet - Comprehensive metabolic panel - Lipid panel - TSH - POCT urinalysis dipstick - MM 3D SCREEN BREAST BILATERAL; Future - VITAMIN D 25 Hydroxy (Vit-D Deficiency, Fractures) - Vitamin B12 - Hemoglobin A1c  2. Encounter for screening mammogram for breast cancer Schedule at [facility]  3. Colon cancer screening - Cologuard  4. Need for hepatitis C screening test - Hepatitis C antibody  5. Encounter for screening for cervical cancer  - Cytology - PAP  6. GERD without esophagitis Symptoms well controlled on daily PPI No red flag signs such as unintentional weight loss, n/v, melena Will continue omeprazole. - omeprazole (PRILOSEC) 20 MG capsule; Take 1 capsule (20 mg total) by mouth 2 (two) times daily before a meal.  Dispense: 180 capsule; Refill: 3  7. Exercise-induced asthma Seasonal symptoms well controlled on PRN albuterol - Albuterol Sulfate (PROAIR RESPICLICK) 413 (90 Base) MCG/ACT AEPB; Inhale 2 puffs into the lungs 2 (two) times daily as needed.  Dispense: 3 each; Refill: 3   Partially dictated using Editor, commissioning. Any errors are unintentional.  Halina Maidens, MD [facility]  04/11/2020   

## 2020-04-11 ENCOUNTER — Other Ambulatory Visit: Payer: Self-pay

## 2020-04-11 ENCOUNTER — Encounter: Payer: Self-pay | Admitting: Internal Medicine

## 2020-04-11 ENCOUNTER — Ambulatory Visit (INDEPENDENT_AMBULATORY_CARE_PROVIDER_SITE_OTHER): Payer: No Typology Code available for payment source | Admitting: Internal Medicine

## 2020-04-11 ENCOUNTER — Other Ambulatory Visit (HOSPITAL_COMMUNITY)
Admission: RE | Admit: 2020-04-11 | Discharge: 2020-04-11 | Disposition: A | Discharge status: Home / Self Care | Payer: No Typology Code available for payment source | Source: Ambulatory Visit | Attending: Internal Medicine | Admitting: Internal Medicine

## 2020-04-11 ENCOUNTER — Other Ambulatory Visit: Payer: Self-pay | Admitting: Internal Medicine

## 2020-04-11 VITALS — BP 126/82 | HR 70 | Temp 98.0°F | Ht 64.0 in | Wt 255.0 lb

## 2020-04-11 DIAGNOSIS — Z124 Encounter for screening for malignant neoplasm of cervix: Secondary | ICD-10-CM | POA: Insufficient documentation

## 2020-04-11 DIAGNOSIS — Z1231 Encounter for screening mammogram for malignant neoplasm of breast: Secondary | ICD-10-CM

## 2020-04-11 DIAGNOSIS — Z1159 Encounter for screening for other viral diseases: Secondary | ICD-10-CM | POA: Diagnosis not present

## 2020-04-11 DIAGNOSIS — J45990 Exercise induced bronchospasm: Secondary | ICD-10-CM

## 2020-04-11 DIAGNOSIS — K219 Gastro-esophageal reflux disease without esophagitis: Secondary | ICD-10-CM | POA: Diagnosis not present

## 2020-04-11 DIAGNOSIS — Z1211 Encounter for screening for malignant neoplasm of colon: Secondary | ICD-10-CM | POA: Diagnosis not present

## 2020-04-11 DIAGNOSIS — Z0000 Encounter for general adult medical examination without abnormal findings: Secondary | ICD-10-CM

## 2020-04-11 LAB — POCT URINALYSIS DIPSTICK
Bilirubin, UA: NEGATIVE
Glucose, UA: NEGATIVE
Ketones, UA: NEGATIVE
Leukocytes, UA: NEGATIVE
Nitrite, UA: NEGATIVE
Protein, UA: NEGATIVE
Spec Grav, UA: 1.015 (ref 1.010–1.025)
Urobilinogen, UA: 0.2 E.U./dL
pH, UA: 6 (ref 5.0–8.0)

## 2020-04-11 MED ORDER — PROAIR RESPICLICK 108 (90 BASE) MCG/ACT IN AEPB: 2.0000 | INHALATION_SPRAY | Freq: Two times a day (BID) | RESPIRATORY_TRACT | 3 refills | Status: DC | PRN

## 2020-04-11 MED ORDER — OMEPRAZOLE 20 MG PO CPDR: 20.0000 mg | DELAYED_RELEASE_CAPSULE | Freq: Two times a day (BID) | ORAL | 3 refills | Status: DC

## 2020-04-12 LAB — CBC WITH DIFFERENTIAL/PLATELET
Basophils Absolute: 0 10*3/uL (ref 0.0–0.2)
Basos: 1 %
EOS (ABSOLUTE): 0.2 10*3/uL (ref 0.0–0.4)
Eos: 3 %
Hematocrit: 33.7 % — ABNORMAL LOW (ref 34.0–46.6)
Hemoglobin: 10 g/dL — ABNORMAL LOW (ref 11.1–15.9)
Immature Grans (Abs): 0 10*3/uL (ref 0.0–0.1)
Immature Granulocytes: 0 %
Lymphocytes Absolute: 1.4 10*3/uL (ref 0.7–3.1)
Lymphs: 23 %
MCH: 20.8 pg — ABNORMAL LOW (ref 26.6–33.0)
MCHC: 29.7 g/dL — ABNORMAL LOW (ref 31.5–35.7)
MCV: 70 fL — ABNORMAL LOW (ref 79–97)
Monocytes Absolute: 0.5 10*3/uL (ref 0.1–0.9)
Monocytes: 8 %
Neutrophils Absolute: 4 10*3/uL (ref 1.4–7.0)
Neutrophils: 65 %
Platelets: 258 10*3/uL (ref 150–450)
RBC: 4.8 x10E6/uL (ref 3.77–5.28)
RDW: 17.6 % — ABNORMAL HIGH (ref 11.7–15.4)
WBC: 6.1 10*3/uL (ref 3.4–10.8)

## 2020-04-12 LAB — VITAMIN D 25 HYDROXY (VIT D DEFICIENCY, FRACTURES): Vit D, 25-Hydroxy: 39.7 ng/mL (ref 30.0–100.0)

## 2020-04-12 LAB — HEPATITIS C ANTIBODY: Hep C Virus Ab: <0.1 s/co ratio (ref 0.0–0.9)

## 2020-04-12 LAB — COMPREHENSIVE METABOLIC PANEL
ALT: 14 IU/L (ref 0–32)
AST: 12 IU/L (ref 0–40)
Albumin/Globulin Ratio: 1.5 (ref 1.2–2.2)
Albumin: 4 g/dL (ref 3.8–4.8)
Alkaline Phosphatase: 94 IU/L (ref 48–121)
BUN/Creatinine Ratio: 19 (ref 9–23)
BUN: 18 mg/dL (ref 6–24)
Bilirubin Total: 0.3 mg/dL (ref 0.0–1.2)
CO2: 23 mmol/L (ref 20–29)
Calcium: 9.3 mg/dL (ref 8.7–10.2)
Chloride: 103 mmol/L (ref 96–106)
Creatinine, Ser: 0.97 mg/dL (ref 0.57–1.00)
GFR calc Af Amer: 79 mL/min/{1.73_m2} (ref >59)
GFR calc non Af Amer: 68 mL/min/{1.73_m2} (ref >59)
Globulin, Total: 2.7 g/dL (ref 1.5–4.5)
Glucose: 93 mg/dL (ref 65–99)
Potassium: 4.8 mmol/L (ref 3.5–5.2)
Sodium: 141 mmol/L (ref 134–144)
Total Protein: 6.7 g/dL (ref 6.0–8.5)

## 2020-04-12 LAB — LIPID PANEL
Chol/HDL Ratio: 4.3 ratio (ref 0.0–4.4)
Cholesterol, Total: 181 mg/dL (ref 100–199)
HDL: 42 mg/dL (ref >39)
LDL Chol Calc (NIH): 122 mg/dL — ABNORMAL HIGH (ref 0–99)
Triglycerides: 94 mg/dL (ref 0–149)
VLDL Cholesterol Cal: 17 mg/dL (ref 5–40)

## 2020-04-12 LAB — HEMOGLOBIN A1C
Est. average glucose Bld gHb Est-mCnc: 123 mg/dL
Hgb A1c MFr Bld: 5.9 % — ABNORMAL HIGH (ref 4.8–5.6)

## 2020-04-12 LAB — VITAMIN B12: Vitamin B-12: 390 pg/mL (ref 232–1245)

## 2020-04-12 LAB — TSH: TSH: 0.943 u[IU]/mL (ref 0.450–4.500)

## 2020-04-13 LAB — CYTOLOGY - PAP
Comment: NEGATIVE
Diagnosis: NEGATIVE
High risk HPV: NEGATIVE

## 2020-05-16 LAB — COLOGUARD: Cologuard: NEGATIVE

## 2020-05-17 ENCOUNTER — Ambulatory Visit
Admission: RE | Admit: 2020-05-17 | Discharge: 2020-05-17 | Disposition: A | Discharge status: Home / Self Care | Payer: No Typology Code available for payment source | Source: Ambulatory Visit | Attending: Internal Medicine | Admitting: Internal Medicine

## 2020-05-17 ENCOUNTER — Other Ambulatory Visit: Payer: Self-pay

## 2020-05-17 DIAGNOSIS — Z0000 Encounter for general adult medical examination without abnormal findings: Secondary | ICD-10-CM

## 2020-05-17 DIAGNOSIS — Z1231 Encounter for screening mammogram for malignant neoplasm of breast: Secondary | ICD-10-CM | POA: Diagnosis present

## 2020-05-25 LAB — COLOGUARD: COLOGUARD: NEGATIVE

## 2020-05-25 NOTE — Progress Notes (Signed)
Please let pt know that her Cologuard test was negative.  Repeat in 3 yrs for screening purposes.

## 2020-05-29 NOTE — Progress Notes (Signed)
Patient informed of neg cologuard and to repeat in 3 years.  CM

## 2020-09-11 ENCOUNTER — Telehealth: Payer: Self-pay

## 2020-09-11 NOTE — Telephone Encounter (Signed)
Called and spoke with patients insurance for at least 30 mins. Finally found the issue. CPT code 65784 for cologuard is coming over at genetic testing instead of Cologuard ( colon cancer screening ) for patients insurance. Her insurance company, [employer], said they need medical records from our office showing what this test is for to try and get it covered.  Called patient and received verbal permission to send over her last CPE, along with her cologuard to her insurance company to show when and what was order for this and then sent over a letter to go this stating what the test is for ( colon cancer screening.)

## 2020-09-11 NOTE — Telephone Encounter (Unsigned)
Copied from CRM [id number]. Topic: General - Other >> Sep 11, 2020 10:54 AM Wyonia Hough E wrote: Reason for CRM: Pt had a colorguard test and received a bill for it from [employer] and [employer] advised that they have requested info from the provider to cover the bill but haven't received a response or been able to contact PCP/ please advise   [employer] Ref# [id number]

## 2021-01-29 ENCOUNTER — Other Ambulatory Visit: Payer: Self-pay

## 2021-01-29 MED FILL — Omeprazole Cap Delayed Release 20 MG: ORAL | 90 days supply | Qty: 180 | Fill #0 | Status: AC

## 2021-02-20 ENCOUNTER — Encounter: Payer: Self-pay | Admitting: Internal Medicine

## 2021-04-16 ENCOUNTER — Encounter: Payer: No Typology Code available for payment source | Admitting: Internal Medicine

## 2021-05-08 ENCOUNTER — Other Ambulatory Visit: Payer: Self-pay | Admitting: Internal Medicine

## 2021-05-09 ENCOUNTER — Other Ambulatory Visit: Payer: Self-pay

## 2021-05-09 ENCOUNTER — Encounter: Payer: Self-pay | Admitting: Internal Medicine

## 2021-05-09 ENCOUNTER — Ambulatory Visit (INDEPENDENT_AMBULATORY_CARE_PROVIDER_SITE_OTHER): Payer: No Typology Code available for payment source | Admitting: Internal Medicine

## 2021-05-09 ENCOUNTER — Telehealth: Payer: Self-pay | Admitting: Internal Medicine

## 2021-05-09 VITALS — BP 122/88 | HR 86 | Temp 98.3°F | Ht 64.0 in | Wt 287.0 lb

## 2021-05-09 DIAGNOSIS — D485 Neoplasm of uncertain behavior of skin: Secondary | ICD-10-CM | POA: Diagnosis not present

## 2021-05-09 DIAGNOSIS — G4733 Obstructive sleep apnea (adult) (pediatric): Secondary | ICD-10-CM | POA: Diagnosis not present

## 2021-05-09 DIAGNOSIS — R7303 Prediabetes: Secondary | ICD-10-CM | POA: Diagnosis not present

## 2021-05-09 DIAGNOSIS — E782 Mixed hyperlipidemia: Secondary | ICD-10-CM

## 2021-05-09 DIAGNOSIS — D509 Iron deficiency anemia, unspecified: Secondary | ICD-10-CM

## 2021-05-09 DIAGNOSIS — Z1231 Encounter for screening mammogram for malignant neoplasm of breast: Secondary | ICD-10-CM

## 2021-05-09 DIAGNOSIS — Z0000 Encounter for general adult medical examination without abnormal findings: Secondary | ICD-10-CM

## 2021-05-09 DIAGNOSIS — J45990 Exercise induced bronchospasm: Secondary | ICD-10-CM

## 2021-05-09 DIAGNOSIS — Z6841 Body Mass Index (BMI) 40.0 and over, adult: Secondary | ICD-10-CM

## 2021-05-09 DIAGNOSIS — N393 Stress incontinence (female) (male): Secondary | ICD-10-CM

## 2021-05-09 MED ORDER — PROAIR RESPICLICK 108 (90 BASE) MCG/ACT IN AEPB
2.0000 | INHALATION_SPRAY | Freq: Two times a day (BID) | RESPIRATORY_TRACT | 3 refills | Status: DC | PRN
Start: 2021-05-09 — End: 2021-05-09
  Filled 2021-05-09: qty 3, 90d supply, fill #0

## 2021-05-09 MED ORDER — TIRZEPATIDE 2.5 MG/0.5ML ~~LOC~~ SOAJ
2.5000 mg | SUBCUTANEOUS | 0 refills | Status: DC
  Filled 2021-05-09: qty 2, 28d supply, fill #0

## 2021-05-09 MED ORDER — PROAIR RESPICLICK 108 (90 BASE) MCG/ACT IN AEPB
2.0000 | INHALATION_SPRAY | Freq: Two times a day (BID) | RESPIRATORY_TRACT | 3 refills | Status: DC | PRN
  Filled 2021-05-09: qty 3, 50d supply, fill #0

## 2021-05-09 NOTE — Telephone Encounter (Signed)
[facility] pharmacy calling to advise the pt's rx Albuterol Sulfate (PROAIR RESPICLICK) [id number] (90 Base) MCG/ACT AEPB  Is not on our preferred med list. The regular ventolin is what is preferred. Does the pt still want to get this medication or have the dr change?

## 2021-05-09 NOTE — Telephone Encounter (Signed)
Generic inhaler sent in to pharmacy.  KP

## 2021-05-09 NOTE — Progress Notes (Signed)
Date:  05/09/2021   Name:  Kaitlyn Mcdaniel   DOB:  [birth year]   MRN:  [id number]   Chief Complaint: Annual Exam Kaitlyn Mcdaniel is a 50 y.o. female who presents today for her Complete Annual Exam. She feels well. She reports exercising walking 1-2 days a week. She reports she is sleeping well. Breast complaints none.  Perimenopausal sx - interested in hormonal therapy.  Mammogram: 05/2020 DEXA: none Pap smear: 03/2020 neg with co-testing Colonoscopy: Cologuard neg 05/2020  Immunization History  Administered Date(s) Administered   Influenza-Unspecified 06/30/2015, 06/13/2017, 06/17/2019   PFIZER(Purple Top)SARS-COV-2 Vaccination 09/07/2019, 10/06/2019, 07/06/2020   Tdap 09/15/2009    Asthma There is no cough, shortness of breath or wheezing. Associated symptoms include heartburn. Pertinent negatives include no chest pain, fever, headaches or trouble swallowing. Her symptoms are alleviated by beta-agonist. Her past medical history is significant for asthma.  Anemia Presents for follow-up visit. There has been no abdominal pain, bruising/bleeding easily, fever, light-headedness or palpitations. Signs of blood loss that are not present include hematemesis, melena and vaginal bleeding.  Gastroesophageal Reflux She complains of heartburn. She reports no abdominal pain, no chest pain, no coughing or no wheezing. This is a recurrent problem. The problem occurs rarely. Pertinent negatives include no fatigue or melena. She has tried a PPI for the symptoms.  Diabetes She presents for her follow-up diabetic visit. Diabetes type: prediabetes. Pertinent negatives for hypoglycemia include no dizziness, headaches, nervousness/anxiousness or tremors. Pertinent negatives for diabetes include no chest pain, no fatigue, no polydipsia and no polyuria. Current diabetic treatment includes diet (she would like to try Mounjaro inj).  OSA - on CPAP nightly - most of the time, sleeps well.  No daytime  somnolence, morning HA.   Lab Results  Component Value Date   CREATININE 0.97 04/11/2020   BUN 18 04/11/2020   NA 141 04/11/2020   K 4.8 04/11/2020   CL 103 04/11/2020   CO2 23 04/11/2020   Lab Results  Component Value Date   CHOL 181 04/11/2020   HDL 42 04/11/2020   LDLCALC 122 (H) 04/11/2020   TRIG 94 04/11/2020   CHOLHDL 4.3 04/11/2020   Lab Results  Component Value Date   TSH 0.943 04/11/2020   Lab Results  Component Value Date   HGBA1C 5.9 (H) 04/11/2020   Lab Results  Component Value Date   WBC 6.1 04/11/2020   HGB 10.0 (L) 04/11/2020   HCT 33.7 (L) 04/11/2020   MCV 70 (L) 04/11/2020   PLT 258 04/11/2020   Lab Results  Component Value Date   ALT 14 04/11/2020   AST 12 04/11/2020   ALKPHOS 94 04/11/2020   BILITOT 0.3 04/11/2020     Review of Systems  Constitutional:  Negative for chills, fatigue and fever.  HENT:  Negative for congestion, hearing loss, tinnitus, trouble swallowing and voice change.   Eyes:  Negative for visual disturbance.  Respiratory:  Negative for cough, chest tightness, shortness of breath and wheezing.   Cardiovascular:  Negative for chest pain, palpitations and leg swelling.  Gastrointestinal:  Positive for heartburn. Negative for abdominal pain, constipation, diarrhea, hematemesis, melena and vomiting.  Endocrine: Negative for polydipsia and polyuria.  Genitourinary:  Positive for frequency and urgency. Negative for dysuria, genital sores, menstrual problem (but mild hot flashes), vaginal bleeding and vaginal discharge.  Musculoskeletal:  Negative for arthralgias, gait problem and joint swelling.  Skin:  Negative for color change and rash.  Neurological:  Negative for dizziness, tremors,  light-headedness and headaches.  Hematological:  Negative for adenopathy. Does not bruise/bleed easily.  Psychiatric/Behavioral:  Negative for dysphoric mood and sleep disturbance. The patient is not nervous/anxious.    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   Status post total knee replacement, left 09/02/2016   Prediabetes 09/05/2015   OSA (obstructive sleep apnea) 08/06/2015   Exercise-induced asthma 04/29/2015   GERD without esophagitis 04/29/2015   Hx of abnormal cervical Papanicolaou smear 04/29/2015   Degenerative arthritis of knee, bilateral 03/08/2015    No Known Allergies  Past Surgical History:  Procedure Laterality Date   [place]rad   abnormal PAP   REDUCTION MAMMAPLASTY Bilateral 1999   TOTAL KNEE ARTHROPLASTY Left 06/2016   TOTAL KNEE ARTHROPLASTY Right 09/2016    Social History   Tobacco Use   Smoking status: Never   Smokeless tobacco: Never  Vaping Use   Vaping Use: Never used  Substance Use Topics   Alcohol use: Yes    Alcohol/week: 1.0 standard drink    Types: 1 Glasses of wine per week   Drug use: No     Medication list has been reviewed and updated.  Current Meds  Medication Sig   acetaminophen (TYLENOL) 500 MG tablet Take 1,000 mg by mouth every 6 (six) hours as needed.   Albuterol Sulfate (PROAIR RESPICLICK) 123XX123 (90 Base) MCG/ACT AEPB Inhale 2 puffs into the lungs 2 (two) times daily as needed.   cetirizine (ZYRTEC) 10 MG tablet Take 10 mg by mouth daily as needed for allergies.   furosemide (LASIX) 40 MG tablet Take 40 mg by mouth daily.   Naproxen Sodium (ALEVE PO) Take by mouth.   NON FORMULARY CPAP at night   omeprazole (PRILOSEC) 20 MG capsule TAKE 1 CAPSULE BY MOUTH 2 TIMES DAILY BEFORE A MEAL.   Probiotic Product (PROBIOTIC-10 PO) Take 1 capsule by mouth daily as needed.   [DISCONTINUED] hydrochlorothiazide (HYDRODIURIL) 25 MG tablet Take 25 mg by  mouth daily.   [DISCONTINUED] vitamin B-12 (CYANOCOBALAMIN) 1000 MCG tablet Take 1 tablet (1,000 mcg total) by mouth daily.    PHQ 2/9 Scores 04/11/2020 11/29/2019 04/11/2019 11/16/2018  PHQ - 2 Score 0 3 0 0  PHQ- 9 Score 0 13 2 -    GAD 7 : Generalized Anxiety Score 04/11/2020  Nervous, Anxious, on Edge 0  Control/stop worrying 0  Worry too much - different things 0  Trouble relaxing 0  Restless 0  Easily annoyed or irritable 0  Afraid - awful might happen 0  Total GAD 7 Score 0  Anxiety Difficulty Not difficult at all    BP Readings from Last 3 Encounters:  05/09/21 122/88  04/11/20 126/82  12/28/19 127/82    Physical Exam Vitals and nursing note reviewed.  Constitutional:      General: She is not in acute distress.    Appearance: She is well-developed.  HENT:     Head: Normocephalic and atraumatic.     Right Ear: Tympanic membrane and ear canal normal.     Left Ear: Tympanic membrane and ear canal normal.     Nose:     Right Sinus: No maxillary sinus tenderness.  Left Sinus: No maxillary sinus tenderness.  Eyes:     General: No scleral icterus.       Right eye: No discharge.        Left eye: No discharge.     Conjunctiva/sclera: Conjunctivae normal.  Neck:     Thyroid: No thyromegaly.     Vascular: No carotid bruit.  Cardiovascular:     Rate and Rhythm: Normal rate and regular rhythm.     Pulses: Normal pulses.     Heart sounds: Normal heart sounds.  Pulmonary:     Effort: Pulmonary effort is normal. No respiratory distress.     Breath sounds: No wheezing.  Chest:  Breasts:    Right: No mass, nipple discharge, skin change or tenderness.     Left: No mass, nipple discharge, skin change or tenderness.  Abdominal:     General: Bowel sounds are normal.     Palpations: Abdomen is soft.     Tenderness: There is no abdominal tenderness.  Musculoskeletal:     Cervical back: Normal range of motion. No erythema.     Right lower leg: No edema.     Left lower  leg: No edema.  Lymphadenopathy:     Cervical: No cervical adenopathy.  Skin:    General: Skin is warm and dry.     Capillary Refill: Capillary refill takes less than 2 seconds.     Findings: No rash.  Neurological:     General: No focal deficit present.     Mental Status: She is alert and oriented to person, place, and time.     Cranial Nerves: No cranial nerve deficit.     Sensory: No sensory deficit.     Deep Tendon Reflexes: Reflexes are normal and symmetric.  Psychiatric:        Attention and Perception: Attention normal.        Mood and Affect: Mood normal.        Behavior: Behavior normal.    Wt Readings from Last 3 Encounters:  05/09/21 287 lb (130.2 kg)  04/11/20 (!) 255 lb (115.7 kg)  12/28/19 273 lb (123.8 kg)    BP 122/88   Pulse 86   Temp 98.3 F (36.8 C) (Oral)   Ht '5\' 4"'$  (1.626 m)   Wt 287 lb (130.2 kg)   LMP 03/16/2021   SpO2 97%   BMI 49.26 kg/m   Assessment and Plan: 1. Annual physical exam Exam is normal except for weight. Encourage regular exercise and appropriate dietary changes. Likely peri-menopausal - she is interested in HRT when appropriate but is not ready at this time due to hesitancy about using cyclic combination estrogen/progesterone agents. - TSH  2. Encounter for screening mammogram for breast cancer Schedule at [facility] - MM 3D SCREEN BREAST BILATERAL  3. OSA (obstructive sleep apnea) Doing well on CPap nightly  4. Prediabetes Will check labs; continue diet efforts Start Mounjaro injections 2.5 mg x 4 weeks then 5 mg weekly Follow up in 2 months - Comprehensive metabolic panel - Hemoglobin A1c - tirzepatide (MOUNJARO) 2.5 MG/0.5ML Pen; Inject 2.5 mg into the skin once a week.  Dispense: 2 mL; Refill: 0  5. Mixed hyperlipidemia Check labs and advise - Lipid panel  6. Chronic iron deficiency anemia She is still having regular menses which contribute to low iron/mild anemia Will check labs and advise - CBC with  Differential/Platelet - Iron, TIBC and Ferritin Panel  7. Exercise-induced asthma Mild intermittent sx relieved by albuterol MDI -  Albuterol Sulfate (PROAIR RESPICLICK) 123XX123 (90 Base) MCG/ACT AEPB; Inhale 2 puffs into the lungs 2 (two) times daily as needed.  Dispense: 3 each; Refill: 3  8. Stress incontinence Requiring the use of a pad daily. Will refer for consideration of other management strategies - Ambulatory referral to Urology  9. Neoplasm of uncertain behavior of skin Recommend annual skin survey - Ambulatory referral to Dermatology  10. BMI 45.0-49.9, adult ([place]) Weight loss efforts and Mounjaro as above   Partially dictated using Editor, commissioning. Any errors are unintentional.  Halina Maidens, MD [facility]  05/09/2021

## 2021-05-10 ENCOUNTER — Other Ambulatory Visit: Payer: Self-pay

## 2021-05-10 ENCOUNTER — Encounter: Payer: Self-pay | Admitting: Internal Medicine

## 2021-05-10 ENCOUNTER — Other Ambulatory Visit: Payer: Self-pay | Admitting: Internal Medicine

## 2021-05-10 ENCOUNTER — Other Ambulatory Visit (HOSPITAL_COMMUNITY): Payer: Self-pay

## 2021-05-10 DIAGNOSIS — R7303 Prediabetes: Secondary | ICD-10-CM

## 2021-05-10 DIAGNOSIS — K219 Gastro-esophageal reflux disease without esophagitis: Secondary | ICD-10-CM

## 2021-05-10 DIAGNOSIS — E785 Hyperlipidemia, unspecified: Secondary | ICD-10-CM

## 2021-05-10 DIAGNOSIS — E1169 Type 2 diabetes mellitus with other specified complication: Secondary | ICD-10-CM

## 2021-05-10 LAB — COMPREHENSIVE METABOLIC PANEL
ALT: 13 IU/L (ref 0–32)
AST: 8 IU/L (ref 0–40)
Albumin/Globulin Ratio: 1.6 (ref 1.2–2.2)
Albumin: 4.1 g/dL (ref 3.8–4.9)
Alkaline Phosphatase: 109 IU/L (ref 44–121)
BUN/Creatinine Ratio: 18 (ref 9–23)
BUN: 15 mg/dL (ref 6–24)
Bilirubin Total: 0.3 mg/dL (ref 0.0–1.2)
CO2: 25 mmol/L (ref 20–29)
Calcium: 9.3 mg/dL (ref 8.7–10.2)
Chloride: 102 mmol/L (ref 96–106)
Creatinine, Ser: 0.84 mg/dL (ref 0.57–1.00)
Globulin, Total: 2.6 g/dL (ref 1.5–4.5)
Glucose: 119 mg/dL — ABNORMAL HIGH (ref 65–99)
Potassium: 4.6 mmol/L (ref 3.5–5.2)
Sodium: 140 mmol/L (ref 134–144)
Total Protein: 6.7 g/dL (ref 6.0–8.5)
eGFR: 84 mL/min/{1.73_m2} (ref >59)

## 2021-05-10 LAB — LIPID PANEL
Chol/HDL Ratio: 4.5 ratio — ABNORMAL HIGH (ref 0.0–4.4)
Cholesterol, Total: 220 mg/dL — ABNORMAL HIGH (ref 100–199)
HDL: 49 mg/dL (ref >39)
LDL Chol Calc (NIH): 151 mg/dL — ABNORMAL HIGH (ref 0–99)
Triglycerides: 112 mg/dL (ref 0–149)
VLDL Cholesterol Cal: 20 mg/dL (ref 5–40)

## 2021-05-10 LAB — CBC WITH DIFFERENTIAL/PLATELET
Basophils Absolute: 0 10*3/uL (ref 0.0–0.2)
Basos: 1 %
EOS (ABSOLUTE): 0 10*3/uL (ref 0.0–0.4)
Eos: 0 %
Hematocrit: 32.6 % — ABNORMAL LOW (ref 34.0–46.6)
Hemoglobin: 10.3 g/dL — ABNORMAL LOW (ref 11.1–15.9)
Immature Grans (Abs): 0 10*3/uL (ref 0.0–0.1)
Immature Granulocytes: 0 %
Lymphocytes Absolute: 1.9 10*3/uL (ref 0.7–3.1)
Lymphs: 28 %
MCH: 22.1 pg — ABNORMAL LOW (ref 26.6–33.0)
MCHC: 31.6 g/dL (ref 31.5–35.7)
MCV: 70 fL — ABNORMAL LOW (ref 79–97)
Monocytes Absolute: 0.5 10*3/uL (ref 0.1–0.9)
Monocytes: 7 %
Neutrophils Absolute: 4.5 10*3/uL (ref 1.4–7.0)
Neutrophils: 64 %
Platelets: 271 10*3/uL (ref 150–450)
RBC: 4.67 x10E6/uL (ref 3.77–5.28)
RDW: 16.5 % — ABNORMAL HIGH (ref 11.7–15.4)
WBC: 6.9 10*3/uL (ref 3.4–10.8)

## 2021-05-10 LAB — IRON,TIBC AND FERRITIN PANEL
Ferritin: 14 ng/mL — ABNORMAL LOW (ref 15–150)
Iron Saturation: 10 % — ABNORMAL LOW (ref 15–55)
Iron: 37 ug/dL (ref 27–159)
Total Iron Binding Capacity: 370 ug/dL (ref 250–450)
UIBC: 333 ug/dL (ref 131–425)

## 2021-05-10 LAB — HEMOGLOBIN A1C
Est. average glucose Bld gHb Est-mCnc: 154 mg/dL
Hgb A1c MFr Bld: 7 % — ABNORMAL HIGH (ref 4.8–5.6)

## 2021-05-10 LAB — TSH: TSH: 0.835 u[IU]/mL (ref 0.450–4.500)

## 2021-05-10 MED ORDER — TIRZEPATIDE 2.5 MG/0.5ML ~~LOC~~ SOAJ
2.5000 mg | SUBCUTANEOUS | 0 refills | Status: DC
Start: 2021-05-10 — End: 2021-05-10
  Filled 2021-05-10: qty 2, 28d supply, fill #0

## 2021-05-10 MED ORDER — TIRZEPATIDE 2.5 MG/0.5ML ~~LOC~~ SOAJ
2.5000 mg | SUBCUTANEOUS | 0 refills | Status: DC
  Filled 2021-05-10: qty 2, 28d supply, fill #0

## 2021-05-10 MED ORDER — ATORVASTATIN CALCIUM 10 MG PO TABS
10.0000 mg | ORAL_TABLET | Freq: Every day | ORAL | 1 refills | Status: DC
Start: 2021-05-10 — End: 2021-09-19
  Filled 2021-05-10: qty 90, 90d supply, fill #0

## 2021-05-10 MED ORDER — MOUNJARO 2.5 MG/0.5ML ~~LOC~~ SOAJ
2.5000 mg | SUBCUTANEOUS | 0 refills | Status: DC
Start: 2021-05-10 — End: 2021-05-21
  Filled 2021-05-10: qty 2, 28d supply, fill #0

## 2021-05-10 MED ORDER — OMEPRAZOLE 20 MG PO CPDR
DELAYED_RELEASE_CAPSULE | ORAL | 1 refills | Status: DC
  Filled 2021-05-10: qty 180, 90d supply, fill #0
  Filled 2021-08-21: qty 180, 90d supply, fill #1

## 2021-05-10 NOTE — Telephone Encounter (Signed)
Please review.  KP

## 2021-05-13 ENCOUNTER — Telehealth: Payer: Self-pay

## 2021-05-13 NOTE — Telephone Encounter (Signed)
PA completed waiting for insurance approval.  [id number]   KP

## 2021-05-13 NOTE — Telephone Encounter (Signed)
PA completed waiting for insurance approval.  [id number]  KP

## 2021-05-15 NOTE — Telephone Encounter (Signed)
Denied by insurance.  KP 

## 2021-05-17 ENCOUNTER — Other Ambulatory Visit (HOSPITAL_COMMUNITY): Payer: Self-pay

## 2021-05-17 ENCOUNTER — Other Ambulatory Visit: Payer: Self-pay | Admitting: Internal Medicine

## 2021-05-17 DIAGNOSIS — J45990 Exercise induced bronchospasm: Secondary | ICD-10-CM

## 2021-05-17 MED ORDER — ALBUTEROL SULFATE HFA 108 (90 BASE) MCG/ACT IN AERS
2.0000 | INHALATION_SPRAY | Freq: Four times a day (QID) | RESPIRATORY_TRACT | 1 refills | Status: DC | PRN
  Filled 2021-05-17 – 2021-05-21 (×2): qty 54, 75d supply, fill #0

## 2021-05-21 ENCOUNTER — Other Ambulatory Visit: Payer: Self-pay | Admitting: Internal Medicine

## 2021-05-21 ENCOUNTER — Encounter: Payer: Self-pay | Admitting: Internal Medicine

## 2021-05-21 ENCOUNTER — Other Ambulatory Visit: Payer: Self-pay

## 2021-05-21 ENCOUNTER — Other Ambulatory Visit (HOSPITAL_COMMUNITY): Payer: Self-pay

## 2021-05-21 DIAGNOSIS — E118 Type 2 diabetes mellitus with unspecified complications: Secondary | ICD-10-CM

## 2021-05-21 MED ORDER — OZEMPIC (0.25 OR 0.5 MG/DOSE) 2 MG/1.5ML ~~LOC~~ SOPN
0.5000 mg | PEN_INJECTOR | SUBCUTANEOUS | 0 refills | Status: DC
  Filled 2021-05-21: qty 3, 70d supply, fill #0

## 2021-05-21 MED ORDER — MOUNJARO 2.5 MG/0.5ML ~~LOC~~ SOAJ
2.5000 mg | SUBCUTANEOUS | 0 refills | Status: DC
  Filled 2021-05-21: qty 2, 28d supply, fill #0

## 2021-05-21 MED ORDER — SEMAGLUTIDE (1 MG/DOSE) 4 MG/3ML ~~LOC~~ SOPN
PEN_INJECTOR | SUBCUTANEOUS | 1 refills | Status: DC
Start: 2021-06-17 — End: 2021-05-21
  Filled 2021-05-21: qty 3, 64d supply, fill #0

## 2021-05-21 NOTE — Telephone Encounter (Signed)
Denied   Generic albuterol was sent in  .  KP

## 2021-05-22 ENCOUNTER — Other Ambulatory Visit: Payer: Self-pay | Admitting: Internal Medicine

## 2021-05-22 ENCOUNTER — Telehealth: Payer: Self-pay

## 2021-05-22 ENCOUNTER — Encounter: Payer: Self-pay | Admitting: Internal Medicine

## 2021-05-22 ENCOUNTER — Other Ambulatory Visit (HOSPITAL_COMMUNITY): Payer: Self-pay

## 2021-05-22 DIAGNOSIS — Z1379 Encounter for other screening for genetic and chromosomal anomalies: Secondary | ICD-10-CM

## 2021-05-22 DIAGNOSIS — E118 Type 2 diabetes mellitus with unspecified complications: Secondary | ICD-10-CM

## 2021-05-22 MED ORDER — TIRZEPATIDE 5 MG/0.5ML ~~LOC~~ SOAJ
5.0000 mg | SUBCUTANEOUS | 1 refills | Status: DC
Start: 2021-05-22 — End: 2021-07-01
  Filled 2021-05-22 – 2021-06-02 (×2): qty 2, 28d supply, fill #0

## 2021-05-22 NOTE — Telephone Encounter (Signed)
Please review.  KP

## 2021-05-22 NOTE — Telephone Encounter (Signed)
Referral for genetic testing placed.  KP

## 2021-05-30 ENCOUNTER — Inpatient Hospital Stay: Payer: No Typology Code available for payment source | Attending: Oncology | Admitting: Licensed Clinical Social Worker

## 2021-05-30 ENCOUNTER — Inpatient Hospital Stay: Payer: No Typology Code available for payment source

## 2021-05-30 ENCOUNTER — Encounter: Payer: Self-pay | Admitting: Licensed Clinical Social Worker

## 2021-05-30 DIAGNOSIS — Z8049 Family history of malignant neoplasm of other genital organs: Secondary | ICD-10-CM

## 2021-05-30 DIAGNOSIS — Z800 Family history of malignant neoplasm of digestive organs: Secondary | ICD-10-CM | POA: Insufficient documentation

## 2021-05-30 DIAGNOSIS — Z8051 Family history of malignant neoplasm of kidney: Secondary | ICD-10-CM | POA: Insufficient documentation

## 2021-05-30 NOTE — Progress Notes (Signed)
REFERRING PROVIDER: Glean Hess, MD [address] [place],  [place] [address]  PRIMARY PROVIDER:  Glean Hess, MD  PRIMARY REASON FOR VISIT:  1. Family history of uterine cancer   2. Family history of kidney cancer   3. Family history of pancreatic cancer      HISTORY OF PRESENT ILLNESS:   Ms. Koman, a 50 y.o. female, was seen for a [facility] cancer genetics consultation at the request of Dr. Army Melia due to a family history of cancer.  Ms. Suthers presents to clinic today to discuss the possibility of a hereditary predisposition to cancer, genetic testing, and to further clarify her future cancer risks, as well as potential cancer risks for family members.   Ms. Kirchman is a 50 y.o. female with no personal history of cancer.    CANCER HISTORY:  Oncology History   No history exists.     RISK FACTORS:  Menarche was at age 53.  First live birth at age 54.  OCP use for approximately 5 years.  Ovaries intact: yes.  Hysterectomy: no.  Menopausal status: perimenopausal.  HRT use: 0 years. Colonoscopy: Cologuard  Mammogram within the last year: yes. Number of breast biopsies: 0. Up to date with pelvic exams: yes.  Past Medical History:  Diagnosis Date   Anemia    Asthma    Back pain    Dependent edema    Dyspnea    Family history of kidney cancer    Family history of pancreatic cancer    Family history of uterine cancer    GERD (gastroesophageal reflux disease)    Joint pain    Osteoarthritis    S/P TKR (total knee replacement), bilateral    Sleep apnea    Vitamin B 12 deficiency     Past Surgical History:  Procedure Laterality Date   BREAST REDUCTION SURGERY  1999   CERVIX LESION DESTRUCTION  1998   abnormal PAP   REDUCTION MAMMAPLASTY Bilateral 1999   TOTAL KNEE ARTHROPLASTY Left 06/2016   TOTAL KNEE ARTHROPLASTY Right 09/2016    Social History   Socioeconomic History   Marital status: Married    Spouse name: Radiographer, therapeutic    Number of children: 2   Years of education: Not on file   Highest education level: Not on file  Occupational History   Occupation: Veterinary surgeon  Tobacco Use   Smoking status: Never   Smokeless tobacco: Never  Vaping Use   Vaping Use: Never used  Substance and Sexual Activity   Alcohol use: Yes    Alcohol/week: 1.0 standard drink    Types: 1 Glasses of wine per week   Drug use: No   Sexual activity: Yes    Birth control/protection: None  Other Topics Concern   Not on file  Social History Narrative   Not on file   Social Determinants of Health   Financial Resource Strain: Not on file  Food Insecurity: Not on file  Transportation Needs: Not on file  Physical Activity: Not on file  Stress: Not on file  Social Connections: Not on file     FAMILY HISTORY:  We obtained a detailed, 4-generation family history.  Significant diagnoses are listed below: Family History  Problem Relation Age of Onset   Hypertension Mother    Hyperlipidemia Mother    Thyroid disease Mother    Depression Mother    Anxiety disorder Mother    Hypertension Father    Renal cancer Father  Hyperlipidemia Father    Alcoholism Father    Obesity Father    Breast cancer Neg Hx    Ms. Monda has 2 daughters and 1 sister, none have had cancer.  Ms. Spellman mother died at 72 of cancer, unknown type. She went to the hospital with a GI bleed and masses were found on her liver, kidney, uterus, and colon but no biopsy was performed and she passed soon after. Patient had 1 maternal aunt, she had endometrial cancer at 51 and liver/pancreatic cancer at 68 that she passed from. No known cancers in maternal cousins/grandparents.  Ms. Rames father died of metastatic kidney cancer at 26, he was diagnosed at 2. Patient had 1 paternal uncle, no cancer. Paternal grandmother had uterine and cervical cancer in her 48s and died oc COPD in her 12s. Paternal grandfather passed in his 28s of a cardiac  issue.  Ms. Edgington is unaware of previous family history of genetic testing for hereditary cancer risks. Patient's maternal ancestors are of [place], [place], [place] descent, and paternal ancestors are of [place] and [place] descent. There is no reported Ashkenazi Jewish ancestry. There is no known consanguinity.    GENETIC COUNSELING ASSESSMENT: Ms. Behan is a 50 y.o. female with a family history of cancer which is somewhat suggestive of a hereditary cancer syndrome and predisposition to cancer. We, therefore, discussed and recommended the following at today's visit.   DISCUSSION: We discussed that approximately 5-10% of  cancer is hereditary, while most cancer (90-95%) is sporadic. Some of the cancers we see in her family can be associated with Lynch syndrome, such as endometrial and pancreatic. This condition increases the risk for cancers such as colon, endometrial and ovarian cancer. Other cancers in her family such as kidney cancer can also be hereditary. Cancers and risks are gene specific.  We discussed that testing is beneficial for several reasons including knowing about other cancer risks, identifying potential screening and risk-reduction options that may be appropriate, and to understand if other family members could be at risk for cancer and allow them to undergo genetic testing.   We reviewed the characteristics, features and inheritance patterns of hereditary cancer syndromes. We also discussed genetic testing, including the appropriate family members to test, the process of testing, insurance coverage and turn-around-time for results. We discussed the implications of a negative, positive and/or variant of uncertain significant result. We recommended Ms. Betton pursue genetic testing for the [facility] CancerNext-Expanded+RNA gene panel.   The CancerNext-Expanded + RNAinsight gene panel offered by [employer] and includes sequencing and rearrangement analysis for the following 77 genes:  IP, ALK, APC*, ATM*, AXIN2, BAP1, BARD1, BLM, BMPR1A, BRCA1*, BRCA2*, BRIP1*, CDC73, CDH1*,CDK4, CDKN1B, CDKN2A, CHEK2*, CTNNA1, DICER1, FANCC, FH, FLCN, GALNT12, KIF1B, LZTR1, MAX, MEN1, MET, MLH1*, MSH2*, MSH3, MSH6*, MUTYH*, NBN, NF1*, NF2, NTHL1, PALB2*, PHOX2B, PMS2*, POT1, PRKAR1A, PTCH1, PTEN*, RAD51C*, RAD51D*,RB1, RECQL, RET, SDHA, SDHAF2, SDHB, SDHC, SDHD, SMAD4, SMARCA4, SMARCB1, SMARCE1, STK11, SUFU, TMEM127, TP53*,TSC1, TSC2, VHL and XRCC2 (sequencing and deletion/duplication); EGFR, EGLN1, HOXB13, KIT, MITF, PDGFRA, POLD1 and POLE (sequencing only); EPCAM and GREM1 (deletion/duplication only).  Based on Ms. Redondo's family history of cancer, she meets medical criteria for genetic testing. Despite that she meets criteria, she may still have an out of pocket cost. We discussed that if her out of pocket cost for testing is over $100, the laboratory will call and confirm whether she wants to proceed with testing.  If the out of pocket cost of testing is less than $100 she  will be billed by the genetic testing laboratory.   PLAN: After considering the risks, benefits, and limitations, Ms. Abarca provided informed consent to pursue genetic testing and the blood sample was sent to [facility] for analysis of the CancerNext-Expanded+RNA panel. Results should be available within approximately 2-3 weeks' time, at which point they will be disclosed by telephone to Ms. Vidaurri, as will any additional recommendations warranted by these results. Ms. Lacap will receive a summary of her genetic counseling visit and a copy of her results once available. This information will also be available in Epic.   Ms. Daigneault questions were answered to her satisfaction today. Our contact information was provided should additional questions or concerns arise. Thank you for the referral and allowing [place] to share in the care of your patient.   Faith Rogue, MS, [facility] Genetic  Counselor [email or web address] Phone: [phone]  The patient was seen for a total of 30 minutes in face-to-face genetic counseling.  Patient was seen alone.  Dr. Grayland Ormond was available for discussion regarding this case.   _______________________________________________________________________ For Office Staff:  Number of people involved in session: 1 Was an Intern/ student involved with case: no

## 2021-06-03 ENCOUNTER — Ambulatory Visit (INDEPENDENT_AMBULATORY_CARE_PROVIDER_SITE_OTHER): Payer: No Typology Code available for payment source | Admitting: Urology

## 2021-06-03 ENCOUNTER — Other Ambulatory Visit: Payer: Self-pay

## 2021-06-03 ENCOUNTER — Other Ambulatory Visit (HOSPITAL_COMMUNITY): Payer: Self-pay

## 2021-06-03 VITALS — BP 135/85 | HR 81 | Ht 63.0 in | Wt 287.0 lb

## 2021-06-03 DIAGNOSIS — N3946 Mixed incontinence: Secondary | ICD-10-CM | POA: Diagnosis not present

## 2021-06-03 DIAGNOSIS — N393 Stress incontinence (female) (male): Secondary | ICD-10-CM

## 2021-06-03 LAB — URINALYSIS, COMPLETE
Bilirubin, UA: NEGATIVE
Glucose, UA: NEGATIVE
Ketones, UA: NEGATIVE
Leukocytes,UA: NEGATIVE
Nitrite, UA: NEGATIVE
Protein,UA: NEGATIVE
RBC, UA: NEGATIVE
Specific Gravity, UA: 1.02 (ref 1.005–1.030)
Urobilinogen, Ur: 0.2 mg/dL (ref 0.2–1.0)
pH, UA: 5.5 (ref 5.0–7.5)

## 2021-06-03 LAB — MICROSCOPIC EXAMINATION: Bacteria, UA: NONE SEEN

## 2021-06-03 NOTE — Patient Instructions (Signed)
Cystoscopy Cystoscopy is a procedure that is used to help diagnose and sometimes treat conditions that affect the lower urinary tract. The lower urinary tract includes the bladder and the urethra. The urethra is the tube that drains urine from the bladder. Cystoscopy is done using a thin, tube-shaped instrument with a light and camera at the end (cystoscope). The cystoscope may be hard or flexible, depending on the goal of the procedure. The cystoscope is inserted through the urethra, into the bladder. Cystoscopy may be recommended if you have: Urinary tract infections that keep coming back. Blood in the urine (hematuria). An inability to control when you urinate (urinary incontinence) or an overactive bladder. Unusual cells found in a urine sample. A blockage in the urethra, such as a urinary stone. Painful urination. An abnormality in the bladder found during an intravenous pyelogram (IVP) or CT scan. Cystoscopy may also be done to remove a sample of tissue to be examined under a microscope (biopsy). What are the risks? Generally, this is a safe procedure. However, problems may occur, including: Infection. Bleeding.  What happens during the procedure?  You will be given one or more of the following: A medicine to numb the area (local anesthetic). The area around the opening of your urethra will be cleaned. The cystoscope will be passed through your urethra into your bladder. Germ-free (sterile) fluid will flow through the cystoscope to fill your bladder. The fluid will stretch your bladder so that your health care provider can clearly examine your bladder walls. Your doctor will look at the urethra and bladder. The cystoscope will be removed The procedure may vary among health care providers  What can I expect after the procedure? After the procedure, it is common to have: Some soreness or pain in your abdomen and urethra. Urinary symptoms. These include: Mild pain or burning when you  urinate. Pain should stop within a few minutes after you urinate. This may last for up to 1 week. A small amount of blood in your urine for several days. Feeling like you need to urinate but producing only a small amount of urine. Follow these instructions at home: General instructions Return to your normal activities as told by your health care provider.  Do not drive for 24 hours if you were given a sedative during your procedure. Watch for any blood in your urine. If the amount of blood in your urine increases, call your health care provider. If a tissue sample was removed for testing (biopsy) during your procedure, it is up to you to get your test results. Ask your health care provider, or the department that is doing the test, when your results will be ready. Drink enough fluid to keep your urine pale yellow. Keep all follow-up visits as told by your health care provider. This is important. Contact a health care provider if you: Have pain that gets worse or does not get better with medicine, especially pain when you urinate. Have trouble urinating. Have more blood in your urine. Get help right away if you: Have blood clots in your urine. Have abdominal pain. Have a fever or chills. Are unable to urinate. Summary Cystoscopy is a procedure that is used to help diagnose and sometimes treat conditions that affect the lower urinary tract. Cystoscopy is done using a thin, tube-shaped instrument with a light and camera at the end. After the procedure, it is common to have some soreness or pain in your abdomen and urethra. Watch for any blood in your urine.   If the amount of blood in your urine increases, call your health care provider. If you were prescribed an antibiotic medicine, take it as told by your health care provider. Do not stop taking the antibiotic even if you start to feel better. This information is not intended to replace advice given to you by your health care provider. Make  sure you discuss any questions you have with your health care provider. Document Revised: 08/24/2018 Document Reviewed: 08/24/2018 Elsevier Patient Education  2020 [employer]  

## 2021-06-03 NOTE — Progress Notes (Signed)
06/03/2021 9:32 AM   Kaitlyn Mcdaniel 1971-08-03 [id number]  Referring provider: Glean Hess, MD [address] [place],  [place] [address]  Chief Complaint  Patient presents with   Urinary Incontinence    HPI: I was consulted to assess the patient's urinary incontinence.  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  She leaks with coughing sneezing sometimes bending lifting her bladder is full.  She can leak going upstairs.  Sometimes she is urge incontinence.  She has mild bedwetting.  She wears 1-2 pads per day that are moderately wet  She reports a good flow.  She voids every 3 hours.  She gets up once or twice at night.  No hysterectomy.  She has a Social worker or friend that has been treated by myself with a bulking agent successfully and was wondering about it  No history of kidney stones bladder surgery or bladder infections.  Normal bowel movements.  No neurologic issues.  No medical treatment.   PMH: Past Medical History:  Diagnosis Date   Anemia    Asthma    Back pain    Dependent edema    Dyspnea    Family history of kidney cancer    Family history of pancreatic cancer    Family history of uterine cancer    GERD (gastroesophageal reflux disease)    Joint pain    Osteoarthritis    S/P TKR (total knee replacement), bilateral    Sleep apnea    Vitamin B 12 deficiency     Surgical History: Past Surgical History:  Procedure Laterality Date   BREAST REDUCTION SURGERY  1999   CERVIX LESION DESTRUCTION  1998   abnormal PAP   REDUCTION MAMMAPLASTY Bilateral 1999   TOTAL KNEE ARTHROPLASTY Left 06/2016   TOTAL KNEE ARTHROPLASTY Right 09/2016    Home Medications:  Allergies as of 06/03/2021   No Known Allergies      Medication List        Accurate as of June 03, 2021  9:32 AM. If you have any questions, ask your nurse or doctor.          acetaminophen 500 MG tablet Commonly known as: TYLENOL Take 1,000 mg by mouth every 6  (six) hours as needed.   albuterol 108 (90 Base) MCG/ACT inhaler Commonly known as: VENTOLIN HFA Inhale 2 puffs into the lungs every 6 (six) hours as needed for wheezing or shortness of breath.   ALEVE PO Take by mouth.   atorvastatin 10 MG tablet Commonly known as: LIPITOR Take 1 tablet (10 mg total) by mouth daily.   cetirizine 10 MG tablet Commonly known as: ZYRTEC Take 10 mg by mouth daily as needed for allergies.   furosemide 40 MG tablet Commonly known as: LASIX Take 40 mg by mouth daily.   NON FORMULARY CPAP at night   omeprazole 20 MG capsule Commonly known as: PRILOSEC TAKE 1 CAPSULE BY MOUTH 2 TIMES DAILY BEFORE A MEAL.   PROBIOTIC-10 PO Take 1 capsule by mouth daily as needed.   tirzepatide 5 MG/0.5ML Pen Commonly known as: MOUNJARO Inject 5 mg into the skin once a week.        Allergies: No Known Allergies  Family History: Family History  Problem Relation Age of Onset   Hypertension Mother    Hyperlipidemia Mother    Thyroid disease Mother    Depression Mother    Anxiety disorder Mother    Hypertension Father    Renal  cancer Father    Hyperlipidemia Father    Alcoholism Father    Obesity Father    Breast cancer Neg Hx     Social History:  reports that she has never smoked. She has never used smokeless tobacco. She reports current alcohol use of about 1.0 standard drink per week. She reports that she does not use drugs.  ROS:                                        Physical Exam: There were no vitals taken for this visit.  Constitutional:  Alert and oriented, No acute distress. HEENT: [place] AT, moist mucus membranes.  Trachea midline, no masses. Cardiovascular: No clubbing, cyanosis, or edema. Respiratory: Normal respiratory effort, no increased work of breathing. GI: Abdomen is soft, nontender, nondistended, no abdominal masses GU: Patient has some urethral descensus at rest.  She had grade 2 hypermobility of the  bladder neck with negative cough test.  Small grade 1 cystocele. Skin: No rashes, bruises or suspicious lesions. Lymph: No cervical or inguinal adenopathy. Neurologic: Grossly intact, no focal deficits, moving all 4 extremities. Psychiatric: Normal mood and affect.  Laboratory Data: Lab Results  Component Value Date   WBC 6.9 05/09/2021   HGB 10.3 (L) 05/09/2021   HCT 32.6 (L) 05/09/2021   MCV 70 (L) 05/09/2021   PLT 271 05/09/2021    Lab Results  Component Value Date   CREATININE 0.84 05/09/2021    No results found for: PSA  No results found for: TESTOSTERONE  Lab Results  Component Value Date   HGBA1C 7.0 (H) 05/09/2021    Urinalysis    Component Value Date/Time   BILIRUBINUR neg 04/11/2020 0839   PROTEINUR Negative 04/11/2020 0839   UROBILINOGEN 0.2 04/11/2020 0839   NITRITE neg 04/11/2020 0839   LEUKOCYTESUR Negative 04/11/2020 0839    Pertinent Imaging: Urine reviewed.  Chart reviewed.  Assessment & Plan: Patient has mild mixed incontinence and mild bedwetting.  She has minimal frequency and mild nocturia.  Role of urodynamics and cystoscopy discussed.  Spoke about health care nursing today.  We will continue to do her care here for the time being  1. Stress incontinence, female  - Urinalysis, Complete   No follow-ups on file.  Reece Packer, MD  [facility] [address], [place], [place] [address] [phone]

## 2021-06-06 ENCOUNTER — Other Ambulatory Visit: Payer: Self-pay

## 2021-06-08 ENCOUNTER — Encounter: Payer: Self-pay | Admitting: Internal Medicine

## 2021-06-12 ENCOUNTER — Telehealth: Payer: Self-pay | Admitting: Licensed Clinical Social Worker

## 2021-06-12 ENCOUNTER — Ambulatory Visit: Payer: Self-pay | Admitting: Licensed Clinical Social Worker

## 2021-06-12 DIAGNOSIS — Z1379 Encounter for other screening for genetic and chromosomal anomalies: Secondary | ICD-10-CM

## 2021-06-12 DIAGNOSIS — Z800 Family history of malignant neoplasm of digestive organs: Secondary | ICD-10-CM

## 2021-06-12 DIAGNOSIS — Z8051 Family history of malignant neoplasm of kidney: Secondary | ICD-10-CM

## 2021-06-12 DIAGNOSIS — Z8049 Family history of malignant neoplasm of other genital organs: Secondary | ICD-10-CM

## 2021-06-12 NOTE — Telephone Encounter (Signed)
Revealed negative genetic testing.  This normal result is reassuring.  It is unlikely that there is an increased risk of cancer due to a mutation in one of these genes.  However, genetic testing is not perfect, and cannot definitively rule out a hereditary cause.  It will be important for her to keep in contact with genetics to learn if any additional testing may be needed in the future.      

## 2021-06-12 NOTE — Progress Notes (Signed)
HPI:  Kaitlyn Mcdaniel was previously seen in the [facility] due to a family history of cancer and concerns regarding a hereditary predisposition to cancer. Please refer to our prior cancer genetics clinic note for more information regarding our discussion, assessment and recommendations, at the time. Kaitlyn Mcdaniel recent genetic test results were disclosed to her, as were recommendations warranted by these results. These results and recommendations are discussed in more detail below.  CANCER HISTORY:  Oncology History   No history exists.    FAMILY HISTORY:  We obtained a detailed, 4-generation family history.  Significant diagnoses are listed below: Family History  Problem Relation Age of Onset   Hypertension Mother    Hyperlipidemia Mother    Thyroid disease Mother    Depression Mother    Anxiety disorder Mother    Hypertension Father    Renal cancer Father    Hyperlipidemia Father    Alcoholism Father    Obesity Father    Breast cancer Neg Hx    Kaitlyn Mcdaniel has 2 daughters and 1 sister, none have had cancer.   Kaitlyn Mcdaniel mother died at 38 of cancer, unknown type. She went to the hospital with a GI bleed and masses were found on her liver, kidney, uterus, and colon but no biopsy was performed and she passed soon after. Patient had 1 maternal aunt, she had endometrial cancer at 47 and liver/pancreatic cancer at 3 that she passed from. No known cancers in maternal cousins/grandparents.   Kaitlyn Mcdaniel father died of metastatic kidney cancer at 27, he was diagnosed at 61. Patient had 1 paternal uncle, no cancer. Paternal grandmother had uterine and cervical cancer in her 3s and died oc COPD in her 27s. Paternal grandfather passed in his 48s of a cardiac issue.   Kaitlyn Mcdaniel is unaware of previous family history of genetic testing for hereditary cancer risks. Patient's maternal ancestors are of [place], [place], [place] descent, and paternal ancestors are of [place]  and [place] descent. There is no reported Ashkenazi Jewish ancestry. There is no known consanguinity.      GENETIC TEST RESULTS: Genetic testing reported out on 06/12/2021 through the [facility] CancerNext-Expanded+RNA cancer panel found no pathogenic mutations.   The CancerNext-Expanded + RNAinsight gene panel offered by [employer] and includes sequencing and rearrangement analysis for the following 77 genes: IP, ALK, APC*, ATM*, AXIN2, BAP1, BARD1, BLM, BMPR1A, BRCA1*, BRCA2*, BRIP1*, CDC73, CDH1*,CDK4, CDKN1B, CDKN2A, CHEK2*, CTNNA1, DICER1, FANCC, FH, FLCN, GALNT12, KIF1B, LZTR1, MAX, MEN1, MET, MLH1*, MSH2*, MSH3, MSH6*, MUTYH*, NBN, NF1*, NF2, NTHL1, PALB2*, PHOX2B, PMS2*, POT1, PRKAR1A, PTCH1, PTEN*, RAD51C*, RAD51D*,RB1, RECQL, RET, SDHA, SDHAF2, SDHB, SDHC, SDHD, SMAD4, SMARCA4, SMARCB1, SMARCE1, STK11, SUFU, TMEM127, TP53*,TSC1, TSC2, VHL and XRCC2 (sequencing and deletion/duplication); EGFR, EGLN1, HOXB13, KIT, MITF, PDGFRA, POLD1 and POLE (sequencing only); EPCAM and GREM1 (deletion/duplication only).   The test report has been scanned into EPIC and is located under the Molecular Pathology section of the Results Review tab.  A portion of the result report is included below for reference.     We discussed that because current genetic testing is not perfect, it is possible there may be a gene mutation in one of these genes that current testing cannot detect, but that chance is small.  There could be another gene that has not yet been discovered, or that we have not yet tested, that is responsible for the cancer diagnoses in the family. It is also possible there is a hereditary cause for the  cancer in the family that Kaitlyn Mcdaniel did not inherit and therefore was not identified in her testing.  Therefore, it is important to remain in touch with cancer genetics in the future so that we can continue to offer Kaitlyn Mcdaniel the most up to date genetic testing.   ADDITIONAL GENETIC TESTING: We discussed  with Kaitlyn Mcdaniel that her genetic testing was fairly extensive.  If there are genes identified to increase cancer risk that can be analyzed in the future, we would be happy to discuss and coordinate this testing at that time.    CANCER SCREENING RECOMMENDATIONS: Kaitlyn Mcdaniel test result is considered negative (normal).  This means that we have not identified a hereditary cause for her  family history of cancer at this time.   While reassuring, this does not definitively rule out a hereditary predisposition to cancer. It is still possible that there could be genetic mutations that are undetectable by current technology. There could be genetic mutations in genes that have not been tested or identified to increase cancer risk.  Therefore, it is recommended she continue to follow the cancer management and screening guidelines provided by her primary healthcare provider.   An individual's cancer risk and medical management are not determined by genetic test results alone. Overall cancer risk assessment incorporates additional factors, including personal medical history, family history, and any available genetic information that may result in a personalized plan for cancer prevention and surveillance.  RECOMMENDATIONS FOR FAMILY MEMBERS:  Relatives in this family might be at some increased risk of developing cancer, over the general population risk, simply due to the family history of cancer.  We recommended female relatives in this family have a yearly mammogram beginning at age 75, or 36 years younger than the earliest onset of cancer, an annual clinical breast exam, and perform monthly breast self-exams. Female relatives in this family should also have a gynecological exam as recommended by their primary provider.  All family members should be referred for colonoscopy starting at age 74.    It is also possible there is a hereditary cause for the cancer in Kaitlyn Mcdaniel's family that she did not inherit and  therefore was not identified in her.  Based on Kaitlyn Mcdaniel's family history, we recommended maternal/paternal cousins and her sister have genetic counseling and testing. Kaitlyn Mcdaniel will let us know if we can be of any assistance in coordinating genetic counseling and/or testing for these family members.  FOLLOW-UP: Lastly, we discussed with Kaitlyn Mcdaniel that cancer genetics is a rapidly advancing field and it is possible that new genetic tests will be appropriate for her and/or her family members in the future. We encouraged her to remain in contact with cancer genetics on an annual basis so we can update her personal and family histories and let her know of advances in cancer genetics that may benefit this family.   Our contact number was provided. Kaitlyn Mcdaniel questions were answered to her satisfaction, and she knows she is welcome to call us at anytime with additional questions or concerns.   Faith Rogue, MS, [facility] Genetic Counselor [email or web address] Phone: [phone]

## 2021-06-18 ENCOUNTER — Other Ambulatory Visit: Payer: Self-pay

## 2021-06-18 ENCOUNTER — Ambulatory Visit
Admission: RE | Admit: 2021-06-18 | Discharge: 2021-06-18 | Disposition: A | Discharge status: Home / Self Care | Payer: No Typology Code available for payment source | Source: Ambulatory Visit | Attending: Internal Medicine | Admitting: Internal Medicine

## 2021-06-18 DIAGNOSIS — Z1231 Encounter for screening mammogram for malignant neoplasm of breast: Secondary | ICD-10-CM | POA: Insufficient documentation

## 2021-07-01 ENCOUNTER — Other Ambulatory Visit: Payer: Self-pay | Admitting: Internal Medicine

## 2021-07-01 ENCOUNTER — Encounter: Payer: Self-pay | Admitting: Internal Medicine

## 2021-07-01 ENCOUNTER — Encounter: Payer: Self-pay | Admitting: Urology

## 2021-07-01 DIAGNOSIS — E118 Type 2 diabetes mellitus with unspecified complications: Secondary | ICD-10-CM

## 2021-07-01 MED ORDER — TIRZEPATIDE 7.5 MG/0.5ML ~~LOC~~ SOAJ
7.5000 mg | SUBCUTANEOUS | 1 refills | Status: DC
  Filled 2021-07-01: qty 6, 84d supply, fill #0

## 2021-07-02 ENCOUNTER — Other Ambulatory Visit: Payer: Self-pay

## 2021-07-03 ENCOUNTER — Other Ambulatory Visit: Payer: Self-pay

## 2021-07-10 ENCOUNTER — Other Ambulatory Visit: Payer: Self-pay

## 2021-07-10 ENCOUNTER — Ambulatory Visit (INDEPENDENT_AMBULATORY_CARE_PROVIDER_SITE_OTHER): Payer: No Typology Code available for payment source | Admitting: Internal Medicine

## 2021-07-10 ENCOUNTER — Encounter: Payer: Self-pay | Admitting: Internal Medicine

## 2021-07-10 VITALS — BP 108/74 | HR 87 | Ht 63.0 in | Wt 268.0 lb

## 2021-07-10 DIAGNOSIS — E1169 Type 2 diabetes mellitus with other specified complication: Secondary | ICD-10-CM

## 2021-07-10 DIAGNOSIS — D649 Anemia, unspecified: Secondary | ICD-10-CM | POA: Diagnosis not present

## 2021-07-10 DIAGNOSIS — E785 Hyperlipidemia, unspecified: Secondary | ICD-10-CM

## 2021-07-10 DIAGNOSIS — E118 Type 2 diabetes mellitus with unspecified complications: Secondary | ICD-10-CM | POA: Diagnosis not present

## 2021-07-10 LAB — POCT UA - MICROALBUMIN: Microalbumin Ur, POC: 50 mg/L

## 2021-07-10 MED ORDER — FERROUS GLUCONATE 324 (38 FE) MG PO TABS
324.0000 mg | ORAL_TABLET | Freq: Every day | ORAL | 0 refills | Status: AC
  Filled 2021-07-10: qty 100, 100d supply, fill #0
  Filled 2021-07-10: qty 90, 90d supply, fill #0

## 2021-07-10 NOTE — Progress Notes (Signed)
Date:  07/10/2021   Name:  Kaitlyn Mcdaniel   DOB:  Nov 12, 1970   MRN:  [id number]   Chief Complaint: Weight Check, Diabetes (Foot Exam. ), and Anemia  Diabetes She presents for her follow-up diabetic visit. She has type 2 diabetes mellitus. Her disease course has been improving. Pertinent negatives for hypoglycemia include no headaches or tremors. Associated symptoms include fatigue and weight loss. Pertinent negatives for diabetes include no chest pain, no polydipsia and no polyuria. Current diabetic treatments: started [facility] last visit. Her weight is decreasing steadily (has lost 19 lbs so far). An ACE inhibitor/angiotensin II receptor blocker is not being taken.  Hyperlipidemia This is a new problem. Pertinent negatives include no chest pain or shortness of breath. Current antihyperlipidemic treatment includes statins (did not start statin last visit).  Anemia Presents for follow-up (started iron) visit. Symptoms include malaise/fatigue and weight loss. There has been no abdominal pain, fever, leg swelling, light-headedness or palpitations. Signs of blood loss that are present include vaginal bleeding (has menstrual cycle last month). Compliance problems: did not start iron supplement.    Obesity - now on Mounjaro for DM and weight loss.  She initially has nausea but it now tolerating the 7.5 mg dose.  Lab Results  Component Value Date   CREATININE 0.84 05/09/2021   BUN 15 05/09/2021   NA 140 05/09/2021   K 4.6 05/09/2021   CL 102 05/09/2021   CO2 25 05/09/2021   Lab Results  Component Value Date   CHOL 220 (H) 05/09/2021   HDL 49 05/09/2021   LDLCALC 151 (H) 05/09/2021   TRIG 112 05/09/2021   CHOLHDL 4.5 (H) 05/09/2021   Lab Results  Component Value Date   TSH 0.835 05/09/2021   Lab Results  Component Value Date   HGBA1C 7.0 (H) 05/09/2021   Lab Results  Component Value Date   WBC 6.9 05/09/2021   HGB 10.3 (L) 05/09/2021   HCT 32.6 (L) 05/09/2021   MCV 70 (L)  05/09/2021   PLT 271 05/09/2021   Lab Results  Component Value Date   ALT 13 05/09/2021   AST 8 05/09/2021   ALKPHOS 109 05/09/2021   BILITOT 0.3 05/09/2021     Review of Systems  Constitutional:  Positive for fatigue, malaise/fatigue and weight loss. Negative for appetite change, fever and unexpected weight change.  HENT:  Negative for tinnitus and trouble swallowing.   Eyes:  Negative for visual disturbance.  Respiratory:  Negative for cough, chest tightness and shortness of breath.   Cardiovascular:  Negative for chest pain, palpitations and leg swelling.  Gastrointestinal:  Positive for constipation. Negative for abdominal pain, diarrhea, nausea and vomiting.  Endocrine: Negative for polydipsia and polyuria.  Genitourinary:  Positive for vaginal bleeding (has menstrual cycle last month). Negative for dysuria and hematuria.  Musculoskeletal:  Negative for arthralgias.  Neurological:  Negative for tremors, light-headedness, numbness and headaches.  Psychiatric/Behavioral:  Negative for dysphoric mood and sleep disturbance.    Patient Active Problem List   Diagnosis Date Noted   Genetic testing 06/12/2021   Family history of uterine cancer 05/30/2021   Family history of pancreatic cancer 05/30/2021   Family history of kidney cancer 05/30/2021   B12 deficiency anemia 06/09/2019   Obesity 11/26/2018   Abnormal uterine bleeding 11/26/2018   Situational anxiety 03/27/2018   Chronic iron deficiency anemia 03/26/2018   Mixed hyperlipidemia 02/24/2018   Status post total knee replacement, 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   Status post total knee replacement,  left 09/02/2016   Type II diabetes mellitus with complication ([place]) 72/62/0355   OSA (obstructive sleep apnea) 08/06/2015   Exercise-induced asthma 04/29/2015   GERD without esophagitis 04/29/2015   Hx of abnormal cervical Papanicolaou smear 04/29/2015   Degenerative arthritis of knee, bilateral 03/08/2015    No Known Allergies  Past  Surgical History:  Procedure Laterality Date   BREAST REDUCTION [place]   abnormal PAP   REDUCTION MAMMAPLASTY Bilateral 1999   TOTAL KNEE ARTHROPLASTY Left 06/2016   TOTAL KNEE ARTHROPLASTY Right 09/2016    Social History   Tobacco Use   Smoking status: Never   Smokeless tobacco: Never  Vaping Use   Vaping Use: Never used  Substance Use Topics   Alcohol use: Yes    Alcohol/week: 1.0 standard drink    Types: 1 Glasses of wine per week   Drug use: No     Medication list has been reviewed and updated.  Current Meds  Medication Sig   acetaminophen (TYLENOL) 500 MG tablet Take 1,000 mg by mouth every 6 (six) hours as needed.   albuterol (VENTOLIN HFA) 108 (90 Base) MCG/ACT inhaler Inhale 2 puffs into the lungs every 6 (six) hours as needed for wheezing or shortness of breath.   cetirizine (ZYRTEC) 10 MG tablet Take 10 mg by mouth daily as needed for allergies.   furosemide (LASIX) 40 MG tablet Take 40 mg by mouth daily as needed.   Naproxen Sodium (ALEVE PO) Take by mouth.   NON FORMULARY CPAP at night   omeprazole (PRILOSEC) 20 MG capsule TAKE 1 CAPSULE BY MOUTH 2 TIMES DAILY BEFORE A MEAL.   Probiotic Product (PROBIOTIC-10 PO) Take 1 capsule by mouth daily as needed.   tirzepatide (MOUNJARO) 7.5 MG/0.5ML Pen Inject 7.5 mg into the skin once a week.    PHQ 2/9 Scores 07/10/2021 07/10/2021 05/09/2021 04/11/2020  PHQ - 2 Score 1 0 1 0  PHQ- 9 Score 2 0 3 0    GAD 7 : Generalized Anxiety Score 07/10/2021 07/10/2021 05/09/2021 04/11/2020  Nervous, Anxious, on Edge 1 0 1 0  Control/stop worrying 0 0 0 0  Worry too much - different things 0 0 0 0  Trouble relaxing 0 0 0 0  Restless 0 0 0 0  Easily annoyed or irritable 1 0 1 0  Afraid - awful might happen 0 0 0 0  Total GAD 7 Score 2 0 2 0  Anxiety Difficulty Not difficult at all Not difficult at all - Not difficult at all    BP Readings from Last 3 Encounters:  07/10/21 108/74   06/03/21 135/85  05/09/21 122/88    Physical Exam Vitals and nursing note reviewed.  Constitutional:      General: She is not in acute distress.    Appearance: She is well-developed.  HENT:     Head: Normocephalic and atraumatic.  Cardiovascular:     Rate and Rhythm: Normal rate and regular rhythm.     Pulses: Normal pulses.  Pulmonary:     Effort: Pulmonary effort is normal. No respiratory distress.     Breath sounds: No wheezing or rhonchi.  Abdominal:     Palpations: Abdomen is soft. There is no mass.     Tenderness: There is no abdominal tenderness. There is no guarding or rebound.     Hernia: No hernia is present.  Musculoskeletal:     Cervical back: Normal range of motion.  Right lower leg: No edema.     Left lower leg: No edema.  Skin:    General: Skin is warm and dry.     Capillary Refill: Capillary refill takes less than 2 seconds.     Findings: No rash.  Neurological:     General: No focal deficit present.     Mental Status: She is alert and oriented to person, place, and time.  Psychiatric:        Mood and Affect: Mood normal.        Behavior: Behavior normal.    Wt Readings from Last 3 Encounters:  07/10/21 268 lb (121.6 kg)  06/03/21 287 lb (130.2 kg)  05/09/21 287 lb (130.2 kg)    BP 108/74   Pulse 87   Ht 5\' 3"  (1.6 m)   Wt 268 lb (121.6 kg)   SpO2 99%   BMI 47.47 kg/m   Assessment and Plan: 1. Type II diabetes mellitus with complication (HCC) Weight is decreasing steadily on Mounjaro. Continue current regimen; check labs next visit - POCT UA - Microalbumin  2. Hyperlipidemia associated with type 2 diabetes mellitus ([place]) Currently working with diet and weight loss Pt prefers to hold off on statin for now and recheck next visit  3. Anemia, unspecified type Recommend starting iron supplement, esp since she is still menstruating - ferrous gluconate (FERGON) 324 MG tablet; Take 1 tablet (324 mg total) by mouth daily.  Dispense: 90  tablet; Refill: 0   Partially dictated using Editor, commissioning. Any errors are unintentional.  Halina Maidens, MD [facility]  07/10/2021

## 2021-07-11 ENCOUNTER — Other Ambulatory Visit: Payer: Self-pay

## 2021-07-11 ENCOUNTER — Other Ambulatory Visit: Payer: Self-pay | Admitting: Urology

## 2021-07-12 ENCOUNTER — Other Ambulatory Visit: Payer: Self-pay

## 2021-07-22 ENCOUNTER — Other Ambulatory Visit: Payer: No Typology Code available for payment source | Admitting: Urology

## 2021-08-05 ENCOUNTER — Ambulatory Visit (INDEPENDENT_AMBULATORY_CARE_PROVIDER_SITE_OTHER): Payer: No Typology Code available for payment source | Admitting: Urology

## 2021-08-05 ENCOUNTER — Other Ambulatory Visit: Payer: Self-pay

## 2021-08-05 VITALS — BP 132/84 | HR 85 | Ht 63.0 in | Wt 268.0 lb

## 2021-08-05 DIAGNOSIS — N393 Stress incontinence (female) (male): Secondary | ICD-10-CM

## 2021-08-05 DIAGNOSIS — N3946 Mixed incontinence: Secondary | ICD-10-CM

## 2021-08-05 LAB — MICROSCOPIC EXAMINATION: Bacteria, UA: NONE SEEN

## 2021-08-05 LAB — URINALYSIS, COMPLETE
Bilirubin, UA: NEGATIVE
Glucose, UA: NEGATIVE
Leukocytes,UA: NEGATIVE
Nitrite, UA: NEGATIVE
Protein,UA: NEGATIVE
RBC, UA: NEGATIVE
Specific Gravity, UA: 1.015 (ref 1.005–1.030)
Urobilinogen, Ur: 0.2 mg/dL (ref 0.2–1.0)
pH, UA: 5.5 (ref 5.0–7.5)

## 2021-08-05 MED ORDER — MIRABEGRON ER 50 MG PO TB24: 50.0000 mg | ORAL_TABLET | Freq: Every day | ORAL | 11 refills | Status: DC

## 2021-08-05 NOTE — Progress Notes (Signed)
08/05/2021 8:34 AM   [name] 28-Mar-1971 [id number]  Referring provider: Glean Hess, [place],  [place] [address]  No chief complaint on file.   HPI: I was consulted to assess the patient's urinary incontinence.  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  She leaks with coughing sneezing sometimes bending lifting her bladder is full.  She can leak going upstairs.  Sometimes she is urge incontinence.  She has mild bedwetting.  She wears 1-2 pads per day that are moderately wet  She reports a good flow.  She voids every 3 hours.  She gets up once or twice at night.  No hysterectomy.  She has a Social worker or friend that has been treated by myself with a bulking agent successfully and was wondering about it    Patient has some urethral descensus at rest.  She had grade 2 hypermobility of the bladder neck with negative cough test.  Small grade 1 cystocele.  Patient has mild mixed incontinence and mild bedwetting.  She has minimal frequency and mild nocturia.   Spoke about health care nursing today.    Today Frequency stable.  Incontinence stable. On urodynamics she did not void and was catheterized for a few milliliters.  Maximum bladder capacity was 240 mL.  She had increased bladder sensation.  Her bladder was unstable reaching a pressure of 8 cm of water.  She did not feel urgency and she did not leak.  I do think one of the contractions was triggered by coughing.  She had no stress incontinence with a Valsalva pressure 126 cm of water.  We had to fill her a second time to try to get her to voluntarily void.  She voided 12 mL with a maximum flow 3 mils per second.  Maximum voiding pressure was 8 cm of water.  She actually did generate a contraction of approximately 18 cm of water but did not void but was feeling urgency.  She then went to the restroom and emptied her bladder voiding 300 mL.  EMG activity within normal limits.  Bladder neck  descended less than 1 cm.  She was not feeling the overactivity.  The details of the urodynamics are signed and dictated  On repeat examination patient has quite a bit of rotational descent of the urethra with a mild positive cough test before and after cystoscopy.  She has rotational descent of the small cystocele is asymptomatic.  Cystoscopy: Patient underwent flexible cystoscopy.  Bladder mucosa and trigone were normal.  No cystitis.  No carcinoma.  Long healthy urethra.        PMH: Past Medical History:  Diagnosis Date   Anemia    Asthma    Back pain    Dependent edema    Dyspnea    Family history of kidney cancer    Family history of pancreatic cancer    Family history of uterine cancer    GERD (gastroesophageal reflux disease)    Joint pain    Osteoarthritis    S/P TKR (total knee replacement), bilateral    Sleep apnea    Vitamin B 12 deficiency     Surgical History: Past Surgical History:  Procedure Laterality Date   BREAST REDUCTION SURGERY  1999   CERVIX LESION DESTRUCTION  1998   abnormal PAP   REDUCTION MAMMAPLASTY Bilateral 1999   TOTAL KNEE ARTHROPLASTY Left 06/2016   TOTAL KNEE ARTHROPLASTY Right 09/2016    Home Medications:  Allergies as of 08/05/2021   No Known Allergies      Medication List        Accurate as of August 05, 2021  8:34 AM. If you have any questions, ask your nurse or doctor.          acetaminophen 500 MG tablet Commonly known as: TYLENOL Take 1,000 mg by mouth every 6 (six) hours as needed.   albuterol 108 (90 Base) MCG/ACT inhaler Commonly known as: VENTOLIN HFA Inhale 2 puffs into the lungs every 6 (six) hours as needed for wheezing or shortness of breath.   ALEVE PO Take by mouth.   atorvastatin 10 MG tablet Commonly known as: LIPITOR Take 1 tablet (10 mg total) by mouth daily.   cetirizine 10 MG tablet Commonly known as: ZYRTEC Take 10 mg by mouth daily as needed for allergies.   ferrous gluconate 324 MG  tablet Commonly known as: FERGON Take 1 tablet (324 mg total) by mouth daily.   furosemide 40 MG tablet Commonly known as: LASIX Take 40 mg by mouth daily as needed.   Mounjaro 7.5 MG/0.5ML Pen Generic drug: tirzepatide Inject 7.5 mg into the skin once a week.   NON FORMULARY CPAP at night   omeprazole 20 MG capsule Commonly known as: PRILOSEC TAKE 1 CAPSULE BY MOUTH 2 TIMES DAILY BEFORE A MEAL.   PROBIOTIC-10 PO Take 1 capsule by mouth daily as needed.        Allergies: No Known Allergies  Family History: Family History  Problem Relation Age of Onset   Hypertension Mother    Hyperlipidemia Mother    Thyroid disease Mother    Depression Mother    Anxiety disorder Mother    Hypertension Father    Renal cancer Father    Hyperlipidemia Father    Alcoholism Father    Obesity Father    Breast cancer Neg Hx     Social History:  reports that she has never smoked. She has never used smokeless tobacco. She reports current alcohol use of about 1.0 standard drink per week. She reports that she does not use drugs.  ROS:                                        Physical Exam: There were no vitals taken for this visit.  Constitutional:  Alert and oriented, No acute distress. HEENT: [place] AT, moist mucus membranes.  Trachea midline, no masses.   Laboratory Data: Lab Results  Component Value Date   WBC 6.9 05/09/2021   HGB 10.3 (L) 05/09/2021   HCT 32.6 (L) 05/09/2021   MCV 70 (L) 05/09/2021   PLT 271 05/09/2021    Lab Results  Component Value Date   CREATININE 0.84 05/09/2021    No results found for: PSA  No results found for: TESTOSTERONE  Lab Results  Component Value Date   HGBA1C 7.0 (H) 05/09/2021    Urinalysis    Component Value Date/Time   APPEARANCEUR Clear 06/03/2021 0940   GLUCOSEU Negative 06/03/2021 0940   BILIRUBINUR Negative 06/03/2021 0940   PROTEINUR Negative 06/03/2021 0940   UROBILINOGEN 0.2 04/11/2020 0839    NITRITE Negative 06/03/2021 0940   LEUKOCYTESUR Negative 06/03/2021 0940    Pertinent Imaging:   Assessment & Plan: Patient has mild mixed incontinence.  I will try to help with medical behavioral therapy.  Physical therapy offered.  If she does  not reach her treatment goal I will go over a bulking agent with her and sling.  She may be at slightly higher risk of retention based on urodynamics with a sling.  Based on her pelvic examination findings and body habitus success with bulking agent may or may not be a little bit lower.  Reassess in 4 to 5 weeks on Myrbetriq 50 mg samples and prescription.  She held off on physical therapy.  She understood my treatment algorithm.  Eventually we will discuss treatment of stress component if needed.  1. Stress incontinence, female  - Urinalysis, Complete   No follow-ups on file.  Reece Packer, MD  [facility] [address] [place], [place] [address] [phone]

## 2021-08-21 ENCOUNTER — Other Ambulatory Visit: Payer: Self-pay

## 2021-09-02 ENCOUNTER — Ambulatory Visit: Payer: No Typology Code available for payment source | Admitting: Urology

## 2021-09-10 LAB — HM DIABETES EYE EXAM

## 2021-09-19 ENCOUNTER — Other Ambulatory Visit: Payer: Self-pay

## 2021-09-19 ENCOUNTER — Encounter: Payer: Self-pay | Admitting: Internal Medicine

## 2021-09-19 ENCOUNTER — Ambulatory Visit (INDEPENDENT_AMBULATORY_CARE_PROVIDER_SITE_OTHER): Payer: No Typology Code available for payment source | Admitting: Internal Medicine

## 2021-09-19 VITALS — BP 114/78 | HR 84 | Ht 63.0 in | Wt 259.0 lb

## 2021-09-19 DIAGNOSIS — Z6841 Body Mass Index (BMI) 40.0 and over, adult: Secondary | ICD-10-CM | POA: Diagnosis not present

## 2021-09-19 DIAGNOSIS — E118 Type 2 diabetes mellitus with unspecified complications: Secondary | ICD-10-CM

## 2021-09-19 LAB — POCT GLYCOSYLATED HEMOGLOBIN (HGB A1C): Hemoglobin A1C: 5.4 % (ref 4.0–5.6)

## 2021-09-19 MED ORDER — TIRZEPATIDE 10 MG/0.5ML ~~LOC~~ SOAJ
10.0000 mg | SUBCUTANEOUS | 1 refills | Status: DC
  Filled 2021-09-19: qty 6, 84d supply, fill #0

## 2021-09-19 NOTE — Progress Notes (Signed)
Date:  09/19/2021   Name:  Kaitlyn Mcdaniel   DOB:  12-24-1970   MRN:  [id number]   Chief Complaint: Diabetes (Doing good on medication, weight loss slowed down on 7.5 )  Diabetes She presents for her follow-up diabetic visit. She has type 2 diabetes mellitus. Her disease course has been improving. Pertinent negatives for hypoglycemia include no headaches or tremors. Pertinent negatives for diabetes include no chest pain, no fatigue, no polydipsia and no polyuria. Symptoms are stable. Current diabetic treatments: Mounjaro injections. Her weight is decreasing steadily.   Lab Results  Component Value Date   NA 140 05/09/2021   K 4.6 05/09/2021   CO2 25 05/09/2021   GLUCOSE 119 (H) 05/09/2021   BUN 15 05/09/2021   CREATININE 0.84 05/09/2021   CALCIUM 9.3 05/09/2021   EGFR 84 05/09/2021   GFRNONAA 68 04/11/2020   Lab Results  Component Value Date   CHOL 220 (H) 05/09/2021   HDL 49 05/09/2021   LDLCALC 151 (H) 05/09/2021   TRIG 112 05/09/2021   CHOLHDL 4.5 (H) 05/09/2021   Lab Results  Component Value Date   TSH 0.835 05/09/2021   Lab Results  Component Value Date   HGBA1C 5.4 09/19/2021   Lab Results  Component Value Date   WBC 6.9 05/09/2021   HGB 10.3 (L) 05/09/2021   HCT 32.6 (L) 05/09/2021   MCV 70 (L) 05/09/2021   PLT 271 05/09/2021   Lab Results  Component Value Date   ALT 13 05/09/2021   AST 8 05/09/2021   ALKPHOS 109 05/09/2021   BILITOT 0.3 05/09/2021   Lab Results  Component Value Date   VD25OH 39.7 04/11/2020     Review of Systems  Constitutional:  Negative for appetite change, fatigue, fever and unexpected weight change.  HENT:  Negative for tinnitus and trouble swallowing.   Eyes:  Negative for visual disturbance.  Respiratory:  Negative for cough, chest tightness and shortness of breath.   Cardiovascular:  Negative for chest pain, palpitations and leg swelling.  Gastrointestinal:  Negative for abdominal pain.  Endocrine: Negative for  polydipsia and polyuria.  Genitourinary:  Negative for dysuria and hematuria.  Musculoskeletal:  Negative for arthralgias.  Neurological:  Negative for tremors, numbness and headaches.  Psychiatric/Behavioral:  Negative for dysphoric mood.    Patient Active Problem List   Diagnosis Date Noted   BMI 45.0-49.9, adult ([place]) 09/19/2021   Genetic testing 06/12/2021   Family history of uterine cancer 05/30/2021   Family history of pancreatic cancer 05/30/2021   Family history of kidney cancer 05/30/2021   B12 deficiency anemia 06/09/2019   Obesity 11/26/2018   Abnormal uterine bleeding 11/26/2018   Situational anxiety 03/27/2018   Chronic iron deficiency anemia 03/26/2018   Mixed hyperlipidemia 02/24/2018   Status post total knee replacement, 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   Status post total knee replacement, left 09/02/2016   Type II diabetes mellitus with complication ([place]) 94/85/4627   OSA (obstructive sleep apnea) 08/06/2015   Exercise-induced asthma 04/29/2015   GERD without esophagitis 04/29/2015   Hx of abnormal cervical Papanicolaou smear 04/29/2015   Degenerative arthritis of knee, bilateral 03/08/2015    No Known Allergies  Past Surgical History:  Procedure Laterality Date   [place]   abnormal PAP   REDUCTION MAMMAPLASTY Bilateral 1999   TOTAL KNEE ARTHROPLASTY Left 06/2016   TOTAL KNEE ARTHROPLASTY Right 09/2016    Social History   Tobacco Use  Smoking status: Never   Smokeless tobacco: Never  Vaping Use   Vaping Use: Never used  Substance Use Topics   Alcohol use: Yes    Alcohol/week: 1.0 standard drink    Types: 1 Glasses of wine per week   Drug use: No     Medication list has been reviewed and updated.  Current Meds  Medication Sig   acetaminophen (TYLENOL) 500 MG tablet Take 1,000 mg by mouth every 6 (six) hours as needed.   albuterol (VENTOLIN HFA) 108 (90 Base) MCG/ACT inhaler Inhale 2 puffs into the  lungs every 6 (six) hours as needed for wheezing or shortness of breath.   cetirizine (ZYRTEC) 10 MG tablet Take 10 mg by mouth daily as needed for allergies.   ferrous gluconate (FERGON) 324 MG tablet Take 1 tablet (324 mg total) by mouth daily.   mirabegron ER (MYRBETRIQ) 50 MG TB24 tablet Take 1 tablet (50 mg total) by mouth daily.   Naproxen Sodium (ALEVE PO) Take by mouth.   NON FORMULARY CPAP at night   omeprazole (PRILOSEC) 20 MG capsule TAKE 1 CAPSULE BY MOUTH 2 TIMES DAILY BEFORE A MEAL.   Probiotic Product (PROBIOTIC-10 PO) Take 1 capsule by mouth daily as needed.   tirzepatide (MOUNJARO) 10 MG/0.5ML Pen Inject 10 mg into the skin once a week.   [DISCONTINUED] tirzepatide (MOUNJARO) 7.5 MG/0.5ML Pen Inject 7.5 mg into the skin once a week.    PHQ 2/9 Scores 09/19/2021 07/10/2021 07/10/2021 05/09/2021  PHQ - 2 Score 0 1 0 1  PHQ- 9 Score 1 2 0 3    GAD 7 : Generalized Anxiety Score 09/19/2021 07/10/2021 07/10/2021 05/09/2021  Nervous, Anxious, on Edge 0 1 0 1  Control/stop worrying 0 0 0 0  Worry too much - different things 0 0 0 0  Trouble relaxing 0 0 0 0  Restless 0 0 0 0  Easily annoyed or irritable 0 1 0 1  Afraid - awful might happen 0 0 0 0  Total GAD 7 Score 0 2 0 2  Anxiety Difficulty - Not difficult at all Not difficult at all -    BP Readings from Last 3 Encounters:  09/19/21 114/78  08/05/21 132/84  07/10/21 108/74    Physical Exam Vitals and nursing note reviewed.  Constitutional:      General: She is not in acute distress.    Appearance: She is well-developed.  HENT:     Head: Normocephalic and atraumatic.  Cardiovascular:     Rate and Rhythm: Normal rate and regular rhythm.  Pulmonary:     Effort: Pulmonary effort is normal. No respiratory distress.     Breath sounds: No wheezing or rhonchi.  Musculoskeletal:     Cervical back: Normal range of motion.     Right lower leg: No edema.     Left lower leg: No edema.  Lymphadenopathy:     Cervical: No  cervical adenopathy.  Skin:    General: Skin is warm and dry.     Findings: No rash.  Neurological:     Mental Status: She is alert and oriented to person, place, and time.  Psychiatric:        Mood and Affect: Mood normal.        Behavior: Behavior normal.    Wt Readings from Last 3 Encounters:  09/19/21 259 lb (117.5 kg)  08/05/21 268 lb (121.6 kg)  07/10/21 268 lb (121.6 kg)    BP 114/78    Pulse  84    Ht 5' 3"  (1.6 m)    Wt 259 lb (117.5 kg)    SpO2 98%    BMI 45.88 kg/m   Assessment and Plan: 1. Type II diabetes mellitus with complication (HCC) Clinically stable by exam and report without s/s of hypoglycemia. DM complicated by hypertension and dyslipidemia. Tolerating medications well without side effects or other concerns.  Weight loss has slowed so will increase to 10 mg. U/Cr next visit - may need ARB/ACE Has declines statin so far. - tirzepatide (MOUNJARO) 10 MG/0.5ML Pen; Inject 10 mg into the skin once a week.  Dispense: 6 mL; Refill: 1 - POCT glycosylated hemoglobin (Hb A1C)= 5.4   2. BMI 45.0-49.9, adult [facility]) Doing well with weight loss.   Partially dictated using Editor, commissioning. Any errors are unintentional.  Halina Maidens, MD [facility]  09/19/2021

## 2021-09-23 ENCOUNTER — Encounter: Payer: Self-pay | Admitting: Urology

## 2021-09-23 ENCOUNTER — Other Ambulatory Visit: Payer: Self-pay

## 2021-09-23 ENCOUNTER — Ambulatory Visit (INDEPENDENT_AMBULATORY_CARE_PROVIDER_SITE_OTHER): Payer: No Typology Code available for payment source | Admitting: Urology

## 2021-09-23 VITALS — BP 135/84 | HR 80

## 2021-09-23 DIAGNOSIS — N3946 Mixed incontinence: Secondary | ICD-10-CM

## 2021-09-23 NOTE — Progress Notes (Signed)
09/23/2021 8:54 AM   [name] [birth year] [id number]  Referring provider: Glean Hess, MD [address] [place],   [address]  Chief Complaint  Patient presents with   Stress incontinence, female    HPI: I reviewed chart in detail.  She has a high leak point pressure and an overactive bladder with mild mixed incontinence and mild bedwetting.  If she does not reach her treatment goal with medical and behavioral therapy I was going to discuss a bulking agent and a sling.  She may be at slightly higher risk of retention with a sling and a bulking agent success rate may be a little bit lower noted.  She held off on physical therapy.  Patient is much better.  Going less frequently with less urgency.  Wearing 1 light pad a day for mild stress incontinence.  It is damp.  She still leaks with a hard cough   PMH: Past Medical History:  Diagnosis Date   Anemia    Asthma    Back pain    Dependent edema    Dyspnea    Family history of kidney cancer    Family history of pancreatic cancer    Family history of uterine cancer    GERD (gastroesophageal reflux disease)    Joint pain    Osteoarthritis    S/P TKR (total knee replacement), bilateral    Sleep apnea    Vitamin B 12 deficiency     Surgical History: Past Surgical History:  Procedure Laterality Date   BREAST REDUCTION SURGERY  1999   CERVIX LESION DESTRUCTION  1998   abnormal PAP   REDUCTION MAMMAPLASTY Bilateral 1999   TOTAL KNEE ARTHROPLASTY Left 06/2016   TOTAL KNEE ARTHROPLASTY Right 09/2016    Home Medications:  Allergies as of 09/23/2021   No Known Allergies      Medication List        Accurate as of September 23, 2021  8:54 AM. If you have any questions, ask your nurse or doctor.          acetaminophen 500 MG tablet Commonly known as: TYLENOL Take 1,000 mg by mouth every 6 (six) hours as needed.   albuterol 108 (90 Base) MCG/ACT inhaler Commonly known as: VENTOLIN  HFA Inhale 2 puffs into the lungs every 6 (six) hours as needed for wheezing or shortness of breath.   ALEVE PO Take by mouth.   cetirizine 10 MG tablet Commonly known as: ZYRTEC Take 10 mg by mouth daily as needed for allergies.   ferrous gluconate 324 MG tablet Commonly known as: FERGON Take 1 tablet (324 mg total) by mouth daily.   mirabegron ER 50 MG Tb24 tablet Commonly known as: MYRBETRIQ Take 1 tablet (50 mg total) by mouth daily.   Mounjaro 10 MG/0.5ML Pen Generic drug: tirzepatide Inject 10 mg into the skin once a week.   NON FORMULARY CPAP at night   omeprazole 20 MG capsule Commonly known as: PRILOSEC TAKE 1 CAPSULE BY MOUTH 2 TIMES DAILY BEFORE A MEAL.   PROBIOTIC-10 PO Take 1 capsule by mouth daily as needed.        Allergies: No Known Allergies  Family History: Family History  Problem Relation Age of Onset   Hypertension Mother    Hyperlipidemia Mother    Thyroid disease Mother    Depression Mother    Anxiety disorder Mother    Hypertension Father    Renal cancer Father    Hyperlipidemia Father  Alcoholism Father    Obesity Father    Breast cancer Neg Hx     Social History:  reports that she has never smoked. She has never used smokeless tobacco. She reports current alcohol use of about 1.0 standard drink per week. She reports that she does not use drugs.  ROS:                                        Physical Exam: BP 135/84    Pulse 80   Constitutional:  Alert and oriented, No acute distress. HEENT: [place] AT, moist mucus membranes.  Trachea midline, no masses.   Laboratory Data: Lab Results  Component Value Date   WBC 6.9 05/09/2021   HGB 10.3 (L) 05/09/2021   HCT 32.6 (L) 05/09/2021   MCV 70 (L) 05/09/2021   PLT 271 05/09/2021    Lab Results  Component Value Date   CREATININE 0.84 05/09/2021    No results found for: PSA  No results found for: TESTOSTERONE  Lab Results  Component Value Date    HGBA1C 5.4 09/19/2021    Urinalysis    Component Value Date/Time   APPEARANCEUR Clear 08/05/2021 0831   GLUCOSEU Negative 08/05/2021 0831   BILIRUBINUR Negative 08/05/2021 0831   PROTEINUR Negative 08/05/2021 0831   UROBILINOGEN 0.2 04/11/2020 0839   NITRITE Negative 08/05/2021 0831   LEUKOCYTESUR Negative 08/05/2021 0831    Pertinent Imaging:   Assessment & Plan: Continue with with Myrbetriq and reassess treatment goals in 4 months  There are no diagnoses linked to this encounter.  No follow-ups on file.  Reece Packer, MD  [facility] [address], [place], [place] [address] [phone]

## 2021-10-07 ENCOUNTER — Encounter: Payer: Self-pay | Admitting: Internal Medicine

## 2021-10-08 ENCOUNTER — Telehealth (INDEPENDENT_AMBULATORY_CARE_PROVIDER_SITE_OTHER): Payer: No Typology Code available for payment source | Admitting: Internal Medicine

## 2021-10-08 ENCOUNTER — Encounter: Payer: Self-pay | Admitting: Internal Medicine

## 2021-10-08 VITALS — Ht 63.0 in | Wt 250.0 lb

## 2021-10-08 DIAGNOSIS — U071 COVID-19: Secondary | ICD-10-CM

## 2021-10-08 MED ORDER — PROMETHAZINE-DM 6.25-15 MG/5ML PO SYRP: 5.0000 mL | ORAL_SOLUTION | Freq: Four times a day (QID) | ORAL | 0 refills | Status: DC | PRN

## 2021-10-08 MED ORDER — MOLNUPIRAVIR EUA 200MG CAPSULE: 4.0000 | ORAL_CAPSULE | Freq: Two times a day (BID) | ORAL | 0 refills | Status: AC

## 2021-10-08 NOTE — Progress Notes (Signed)
Date:  10/08/2021   Name:  Kaitlyn Mcdaniel   DOB:  [birth year]   MRN:  [id number]  This encounter was conducted via video encounter due to the need for social distancing in light of the Covid-19 pandemic.  The patient was correctly identified.  I advised that I am conducting the visit from a secure room in my office at [facility] clinic.  The patient is located at home. The limitations of this form of encounter were discussed with the patient and he/she agreed to proceed.  Some vital signs will be absent.  Chief Complaint: Covid Positive (Home positive Covid test 10/09/2021; fatigue, sore throat, cough, headache; husband also Covid positive)  Cough This is a new problem. The current episode started yesterday. The problem occurs every few minutes. The cough is Non-productive. Associated symptoms include chills, a fever, headaches, nasal congestion and a sore throat. Pertinent negatives include no chest pain, shortness of breath or wheezing.   Lab Results  Component Value Date   NA 140 05/09/2021   K 4.6 05/09/2021   CO2 25 05/09/2021   GLUCOSE 119 (H) 05/09/2021   BUN 15 05/09/2021   CREATININE 0.84 05/09/2021   CALCIUM 9.3 05/09/2021   EGFR 84 05/09/2021   GFRNONAA 68 04/11/2020   Lab Results  Component Value Date   CHOL 220 (H) 05/09/2021   HDL 49 05/09/2021   LDLCALC 151 (H) 05/09/2021   TRIG 112 05/09/2021   CHOLHDL 4.5 (H) 05/09/2021   Lab Results  Component Value Date   TSH 0.835 05/09/2021   Lab Results  Component Value Date   HGBA1C 5.4 09/19/2021   Lab Results  Component Value Date   WBC 6.9 05/09/2021   HGB 10.3 (L) 05/09/2021   HCT 32.6 (L) 05/09/2021   MCV 70 (L) 05/09/2021   PLT 271 05/09/2021   Lab Results  Component Value Date   ALT 13 05/09/2021   AST 8 05/09/2021   ALKPHOS 109 05/09/2021   BILITOT 0.3 05/09/2021   Lab Results  Component Value Date   VD25OH 39.7 04/11/2020     Review of Systems  Constitutional:  Positive for chills,  fatigue and fever.  HENT:  Positive for sore throat.   Respiratory:  Positive for cough. Negative for shortness of breath and wheezing.   Cardiovascular:  Negative for chest pain.  Gastrointestinal:  Negative for nausea and vomiting.  Neurological:  Positive for headaches.   Patient Active Problem List   Diagnosis Date Noted   BMI 45.0-49.9, adult ([place]) 09/19/2021   Genetic testing 06/12/2021   Family history of uterine cancer 05/30/2021   Family history of pancreatic cancer 05/30/2021   Family history of kidney cancer 05/30/2021   B12 deficiency anemia 06/09/2019   Obesity 11/26/2018   Abnormal uterine bleeding 11/26/2018   Situational anxiety 03/27/2018   Chronic iron deficiency anemia 03/26/2018   Mixed hyperlipidemia 02/24/2018   Status post total knee replacement, 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   Status post total knee replacement, left 09/02/2016   Type II diabetes mellitus with complication ([place]) 24/58/0998   OSA (obstructive sleep apnea) 08/06/2015   Exercise-induced asthma 04/29/2015   GERD without esophagitis 04/29/2015   Hx of abnormal cervical Papanicolaou smear 04/29/2015   Degenerative arthritis of knee, bilateral 03/08/2015    No Known Allergies  Past Surgical History:  Procedure Laterality Date   [place]   abnormal PAP   REDUCTION MAMMAPLASTY Bilateral 1999  TOTAL KNEE ARTHROPLASTY Left 06/2016   TOTAL KNEE ARTHROPLASTY Right 09/2016    Social History   Tobacco Use   Smoking status: Never   Smokeless tobacco: Never  Vaping Use   Vaping Use: Never used  Substance Use Topics   Alcohol use: Yes    Alcohol/week: 1.0 standard drink    Types: 1 Glasses of wine per week   Drug use: No     Medication list has been reviewed and updated.  Current Meds  Medication Sig   acetaminophen (TYLENOL) 500 MG tablet Take 1,000 mg by mouth every 6 (six) hours as needed.   albuterol (VENTOLIN HFA) 108 (90 Base)  MCG/ACT inhaler Inhale 2 puffs into the lungs every 6 (six) hours as needed for wheezing or shortness of breath.   cetirizine (ZYRTEC) 10 MG tablet Take 10 mg by mouth daily as needed for allergies.   ferrous gluconate (FERGON) 324 MG tablet Take 1 tablet (324 mg total) by mouth daily.   mirabegron ER (MYRBETRIQ) 50 MG TB24 tablet Take 1 tablet (50 mg total) by mouth daily.   Naproxen Sodium (ALEVE PO) Take 2 tablets by mouth as needed.   NON FORMULARY CPAP at night   omeprazole (PRILOSEC) 20 MG capsule TAKE 1 CAPSULE BY MOUTH 2 TIMES DAILY BEFORE A MEAL.   Probiotic Product (PROBIOTIC-10 PO) Take 1 capsule by mouth daily as needed.   tirzepatide (MOUNJARO) 10 MG/0.5ML Pen Inject 10 mg into the skin once a week.    PHQ 2/9 Scores 10/08/2021 09/19/2021 07/10/2021 07/10/2021  PHQ - 2 Score 0 0 1 0  PHQ- 9 Score 0 1 2 0    GAD 7 : Generalized Anxiety Score 10/08/2021 09/19/2021 07/10/2021 07/10/2021  Nervous, Anxious, on Edge 0 0 1 0  Control/stop worrying 0 0 0 0  Worry too much - different things 0 0 0 0  Trouble relaxing 0 0 0 0  Restless 0 0 0 0  Easily annoyed or irritable 0 0 1 0  Afraid - awful might happen 0 0 0 0  Total GAD 7 Score 0 0 2 0  Anxiety Difficulty Not difficult at all - Not difficult at all Not difficult at all    BP Readings from Last 3 Encounters:  09/23/21 135/84  09/19/21 114/78  08/05/21 132/84    Physical Exam Constitutional:      Appearance: She is ill-appearing.  Pulmonary:     Effort: Pulmonary effort is normal.     Comments: Frequent loose cough noted  Neurological:     General: No focal deficit present.     Mental Status: She is alert.  Psychiatric:        Mood and Affect: Mood normal.        Behavior: Behavior normal.    Wt Readings from Last 3 Encounters:  10/08/21 250 lb (113.4 kg)  09/19/21 259 lb (117.5 kg)  08/05/21 268 lb (121.6 kg)    Ht 5' 3"  (1.6 m)    Wt 250 lb (113.4 kg)    BMI 44.29 kg/m   Assessment and Plan: 1. COVID-19  virus infection Take Tylenol 650 - 1000 mg every 6-8 hours for fever, body aches and headache. Drink plenty of fluids with electrolytes. Monitor for fever that does not decrease and/or shortness of breath that worsens or is present at rest. Quarantine for 5 days - molnupiravir EUA (LAGEVRIO) 200 mg CAPS capsule; Take 4 capsules (800 mg total) by mouth 2 (two) times daily for 5  days.  Dispense: 40 capsule; Refill: 0 - promethazine-dextromethorphan (PROMETHAZINE-DM) 6.25-15 MG/5ML syrup; Take 5 mLs by mouth 4 (four) times daily as needed for cough.  Dispense: 180 mL; Refill: 0  I spent 5 minutes on this encounter, 100% of the time via video. Partially dictated using Editor, commissioning. Any errors are unintentional.  Halina Maidens, MD [facility]  10/08/2021

## 2021-10-22 ENCOUNTER — Encounter: Payer: Self-pay | Admitting: Internal Medicine

## 2021-10-23 ENCOUNTER — Telehealth: Payer: Self-pay

## 2021-10-23 NOTE — Telephone Encounter (Signed)
Completed PA on [email or web address] for patient Mounjaro 10 MG/ 0.5 ML.  Key: [id number] - PA Case ID: [id number]  Alternatives are Actos, Glipizide, Metformin or Metaglip.   Awaiting outcome.  Used diagnosis Type 2 Diabetes and Obesity and secondary.

## 2021-10-28 ENCOUNTER — Telehealth: Payer: Self-pay

## 2021-10-28 NOTE — Telephone Encounter (Signed)
Insurance denied mounjaro. Pt needs to take Metformin for 3 months and then try again for [facility]. Spoke to pt earlier she refused to take Metformin. Pt still needs to either try metformin for 3 months, go to [facility] and have them try to get the medication approved, see a specialist which is endocrinology or pay out of pocket. Insurance will not approve pt need to  try Metformin.  [facility] nurse may give results to patient if they return call to clinic, a CRM has been created.  KP

## 2021-10-29 ENCOUNTER — Encounter: Payer: Self-pay | Admitting: Internal Medicine

## 2021-10-29 ENCOUNTER — Telehealth: Payer: Self-pay

## 2021-10-29 ENCOUNTER — Other Ambulatory Visit: Payer: Self-pay

## 2021-10-29 ENCOUNTER — Other Ambulatory Visit: Payer: Self-pay | Admitting: Internal Medicine

## 2021-10-29 DIAGNOSIS — Z6841 Body Mass Index (BMI) 40.0 and over, adult: Secondary | ICD-10-CM

## 2021-10-29 MED ORDER — SEMAGLUTIDE-WEIGHT MANAGEMENT 0.5 MG/0.5ML ~~LOC~~ SOAJ
0.5000 mg | SUBCUTANEOUS | 0 refills | Status: DC
  Filled 2021-10-29 – 2021-11-27 (×3): qty 2, 28d supply, fill #0

## 2021-10-29 NOTE — Telephone Encounter (Signed)
PA completed waiting on insurance approval.  KP 

## 2021-10-29 NOTE — Telephone Encounter (Signed)
Please review.  KP

## 2021-10-29 NOTE — Telephone Encounter (Signed)
PA was denied. Patient has to try and fail metformin, Actos, or glipizide.

## 2021-10-29 NOTE — Telephone Encounter (Signed)
Patient called and given message below. She verbalized understanding and says to go ahead and send in the prescription for Metformin.  [id number] - [address], [address] AT [place] Phone:  [phone]  Fax:  [phone]

## 2021-10-31 ENCOUNTER — Telehealth: Payer: Self-pay

## 2021-10-31 NOTE — Telephone Encounter (Signed)
Denied  KP 

## 2021-10-31 NOTE — Telephone Encounter (Signed)
Called pt left Vm to call back. Wegovy was denied. Pt needs to try metformin.  [facility] nurse may give results to patient if they return call to clinic, a CRM has been created.  KP

## 2021-11-01 ENCOUNTER — Other Ambulatory Visit: Payer: Self-pay

## 2021-11-27 ENCOUNTER — Other Ambulatory Visit: Payer: Self-pay

## 2021-12-26 ENCOUNTER — Other Ambulatory Visit: Payer: Self-pay

## 2021-12-26 ENCOUNTER — Encounter: Payer: Self-pay | Admitting: Internal Medicine

## 2021-12-26 DIAGNOSIS — K219 Gastro-esophageal reflux disease without esophagitis: Secondary | ICD-10-CM

## 2021-12-26 MED ORDER — OMEPRAZOLE 20 MG PO CPDR: 20.0000 mg | DELAYED_RELEASE_CAPSULE | Freq: Two times a day (BID) | ORAL | 0 refills | Status: DC

## 2021-12-27 ENCOUNTER — Encounter: Payer: Self-pay | Admitting: Internal Medicine

## 2022-01-27 ENCOUNTER — Ambulatory Visit: Payer: No Typology Code available for payment source | Admitting: Urology

## 2022-04-23 ENCOUNTER — Encounter (INDEPENDENT_AMBULATORY_CARE_PROVIDER_SITE_OTHER): Payer: Self-pay

## 2022-05-12 ENCOUNTER — Ambulatory Visit (INDEPENDENT_AMBULATORY_CARE_PROVIDER_SITE_OTHER): Payer: BC Managed Care – PPO | Admitting: Internal Medicine

## 2022-05-12 ENCOUNTER — Encounter: Payer: Self-pay | Admitting: Internal Medicine

## 2022-05-12 ENCOUNTER — Other Ambulatory Visit: Payer: Self-pay

## 2022-05-12 VITALS — BP 120/84 | HR 85 | Ht 63.0 in | Wt 223.0 lb

## 2022-05-12 DIAGNOSIS — D509 Iron deficiency anemia, unspecified: Secondary | ICD-10-CM | POA: Diagnosis not present

## 2022-05-12 DIAGNOSIS — E1169 Type 2 diabetes mellitus with other specified complication: Secondary | ICD-10-CM | POA: Diagnosis not present

## 2022-05-12 DIAGNOSIS — Z1231 Encounter for screening mammogram for malignant neoplasm of breast: Secondary | ICD-10-CM

## 2022-05-12 DIAGNOSIS — E785 Hyperlipidemia, unspecified: Secondary | ICD-10-CM | POA: Diagnosis not present

## 2022-05-12 DIAGNOSIS — D513 Other dietary vitamin B12 deficiency anemia: Secondary | ICD-10-CM

## 2022-05-12 DIAGNOSIS — K219 Gastro-esophageal reflux disease without esophagitis: Secondary | ICD-10-CM | POA: Diagnosis not present

## 2022-05-12 DIAGNOSIS — E118 Type 2 diabetes mellitus with unspecified complications: Secondary | ICD-10-CM | POA: Diagnosis not present

## 2022-05-12 DIAGNOSIS — Z0000 Encounter for general adult medical examination without abnormal findings: Secondary | ICD-10-CM | POA: Diagnosis not present

## 2022-05-12 DIAGNOSIS — J45990 Exercise induced bronchospasm: Secondary | ICD-10-CM

## 2022-05-12 DIAGNOSIS — Z23 Encounter for immunization: Secondary | ICD-10-CM | POA: Diagnosis not present

## 2022-05-12 DIAGNOSIS — G4733 Obstructive sleep apnea (adult) (pediatric): Secondary | ICD-10-CM

## 2022-05-12 MED ORDER — TIRZEPATIDE 7.5 MG/0.5ML ~~LOC~~ SOAJ
7.5000 mg | SUBCUTANEOUS | 1 refills | Status: DC
  Filled 2022-05-12: qty 2, 28d supply, fill #0
  Filled 2022-06-10: qty 2, 28d supply, fill #1
  Filled 2022-07-10: qty 2, 28d supply, fill #2
  Filled 2022-08-11: qty 2, 28d supply, fill #3

## 2022-05-12 MED ORDER — ALBUTEROL SULFATE HFA 108 (90 BASE) MCG/ACT IN AERS: 2.0000 | INHALATION_SPRAY | Freq: Four times a day (QID) | RESPIRATORY_TRACT | 1 refills | Status: AC | PRN

## 2022-05-12 MED ORDER — OMEPRAZOLE 20 MG PO CPDR: 20.0000 mg | DELAYED_RELEASE_CAPSULE | Freq: Two times a day (BID) | ORAL | 0 refills | Status: DC

## 2022-05-12 NOTE — Progress Notes (Signed)
Date:  05/12/2022   Name:  Kaitlyn Mcdaniel   DOB:  1971-04-14   MRN:  [id number]   Chief Complaint: Annual Exam Kaitlyn Mcdaniel is a 51 y.o. female who presents today for her Complete Annual Exam. She feels fairly well. She reports walking. She reports she is sleeping - difficulty falling asleep. Breast complaints - none. She is still having regular menses. 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  Mammogram: 06/2021 DEXA: none Pap smear: 03/2020 neg/neg Colonoscopy: Cologuard 05/2020 negative  Health Maintenance Due  Topic Date Due   HIV Screening  Never done   Zoster Vaccines- Shingrix (1 of 2) Never done   TETANUS/TDAP  09/16/2019   HEMOGLOBIN A1C  03/19/2022   INFLUENZA VACCINE  04/15/2022   URINE MICROALBUMIN  07/10/2022    Immunization History  Administered Date(s) Administered   Influenza-Unspecified 06/13/2017, 06/17/2019, 06/15/2021   PFIZER(Purple Top)SARS-COV-2 Vaccination 09/07/2019, 10/06/2019, 07/06/2020   Tdap 09/15/2009    Diabetes She presents for her follow-up diabetic visit. She has type 2 diabetes mellitus. Her disease course has been improving. Pertinent negatives for hypoglycemia include no dizziness, headaches, nervousness/anxiousness or tremors. Pertinent negatives for diabetes include no chest pain, no fatigue, no polydipsia and no polyuria. Current diabetic treatments: Mounjaro - through a weight loss service. Her weight is decreasing steadily.  Hyperlipidemia Pertinent negatives include no chest pain or shortness of breath.    Lab Results  Component Value Date   NA 140 05/09/2021   K 4.6 05/09/2021   CO2 25 05/09/2021   GLUCOSE 119 (H) 05/09/2021   BUN 15 05/09/2021   CREATININE 0.84 05/09/2021   CALCIUM 9.3 05/09/2021   EGFR 84 05/09/2021   GFRNONAA 68 04/11/2020   Lab Results  Component Value Date   CHOL 220 (H) 05/09/2021   HDL 49  05/09/2021   LDLCALC 151 (H) 05/09/2021   TRIG 112 05/09/2021   CHOLHDL 4.5 (H) 05/09/2021   Lab Results  Component Value Date   TSH 0.835 05/09/2021   Lab Results  Component Value Date   HGBA1C 5.4 09/19/2021   Lab Results  Component Value Date   WBC 6.9 05/09/2021   HGB 10.3 (L) 05/09/2021   HCT 32.6 (L) 05/09/2021   MCV 70 (L) 05/09/2021   PLT 271 05/09/2021   Lab Results  Component Value Date   ALT 13 05/09/2021   AST 8 05/09/2021   ALKPHOS 109 05/09/2021   BILITOT 0.3 05/09/2021   Lab Results  Component Value Date   VD25OH 39.7 04/11/2020     Review of Systems  Constitutional:  Negative for chills, fatigue and fever.  HENT:  Negative for congestion, hearing loss, tinnitus, trouble swallowing and voice change.   Eyes:  Negative for visual disturbance.  Respiratory:  Negative for cough, chest tightness, shortness of breath and wheezing.   Cardiovascular:  Negative for chest pain, palpitations and leg swelling.  Gastrointestinal:  Negative for abdominal pain, constipation, diarrhea and vomiting.  Endocrine: Negative for polydipsia and polyuria.  Genitourinary:  Negative for dysuria, frequency, genital sores, vaginal bleeding and vaginal discharge.  Musculoskeletal:  Negative for arthralgias, gait problem and joint swelling.  Skin:  Negative for color change and rash.  Neurological:  Negative for dizziness, tremors, light-headedness and headaches.  Hematological:  Negative for adenopathy. Does not bruise/bleed easily.  Psychiatric/Behavioral:  Negative for dysphoric mood and sleep disturbance. The  patient is not nervous/anxious.     Patient Active Problem List   Diagnosis Date Noted   BMI 45.0-49.9, adult ([place]) 09/19/2021   Genetic testing 06/12/2021   Family history of uterine cancer 05/30/2021   Family history of pancreatic cancer 05/30/2021   Family history of kidney cancer 05/30/2021   B12 deficiency anemia 06/09/2019   Obesity 11/26/2018   Abnormal  uterine bleeding 11/26/2018   Situational anxiety 03/27/2018   Chronic iron deficiency anemia 03/26/2018   Hyperlipidemia associated with type 2 diabetes mellitus ([place]) 02/24/2018   Status post total knee replacement, 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   Status post total knee replacement, left 09/02/2016   Type II diabetes mellitus with complication ([place]) 88/91/6945   OSA (obstructive sleep apnea) 08/06/2015   Exercise-induced asthma 04/29/2015   GERD without esophagitis 04/29/2015   Hx of abnormal cervical Papanicolaou smear 04/29/2015   Degenerative arthritis of knee, bilateral 03/08/2015    No Known Allergies  Past Surgical History:  Procedure Laterality Date   BREAST REDUCTION [place]   abnormal PAP   REDUCTION MAMMAPLASTY Bilateral 1999   TOTAL KNEE ARTHROPLASTY Left 06/2016   TOTAL KNEE ARTHROPLASTY Right 09/2016    Social History   Tobacco Use   Smoking status: Never   Smokeless tobacco: Never  Vaping Use   Vaping Use: Never used  Substance Use Topics   Alcohol use: Yes    Alcohol/week: 1.0 standard drink of alcohol    Types: 1 Glasses of wine per week   Drug use: No     Medication list has been reviewed and updated.  Current Meds  Medication Sig   acetaminophen (TYLENOL) 500 MG tablet Take 1,000 mg by mouth every 6 (six) hours as needed.   albuterol (VENTOLIN HFA) 108 (90 Base) MCG/ACT inhaler Inhale 2 puffs into the lungs every 6 (six) hours as needed for wheezing or shortness of breath.   cetirizine (ZYRTEC) 10 MG tablet Take 10 mg by mouth daily as needed for allergies.   ferrous gluconate (FERGON) 324 MG tablet Take 1 tablet (324 mg total) by mouth daily.   mirabegron ER (MYRBETRIQ) 50 MG TB24 tablet Take 1 tablet (50 mg total) by mouth daily.   Naproxen Sodium (ALEVE PO) Take 2 tablets by mouth as needed.   NON FORMULARY CPAP at night   omeprazole (PRILOSEC) 20 MG capsule Take 1 capsule (20 mg total) by mouth in the morning and  at bedtime.   Probiotic Product (PROBIOTIC-10 PO) Take 1 capsule by mouth daily as needed.   promethazine-dextromethorphan (PROMETHAZINE-DM) 6.25-15 MG/5ML syrup Take 5 mLs by mouth 4 (four) times daily as needed for cough.   tirzepatide [facility]) 5 MG/0.5ML Pen Inject 5 mg into the skin once a week.       05/12/2022   10:48 AM 10/08/2021   11:11 AM 09/19/2021    8:02 AM 07/10/2021    8:22 AM  GAD 7 : Generalized Anxiety Score  Nervous, Anxious, on Edge 0 0 0 1  Control/stop worrying 0 0 0 0  Worry too much - different things 0 0 0 0  Trouble relaxing 0 0 0 0  Restless 0 0 0 0  Easily annoyed or irritable 0 0 0 1  Afraid - awful might happen 0 0 0 0  Total GAD 7 Score 0 0 0 2  Anxiety Difficulty Not difficult at all Not difficult at all  Not difficult at all  05/12/2022   10:48 AM 10/08/2021   11:11 AM 09/19/2021    8:02 AM  Depression screen PHQ 2/9  Decreased Interest 0 0 0  Down, Depressed, Hopeless 0 0 0  PHQ - 2 Score 0 0 0  Altered sleeping 2 0 0  Tired, decreased energy 1 0 1  Change in appetite 0 0 0  Feeling bad or failure about yourself  0 0 0  Trouble concentrating 0 0 0  Moving slowly or fidgety/restless 0 0 0  Suicidal thoughts 0 0 0  PHQ-9 Score 3 0 1  Difficult doing work/chores Not difficult at all Not difficult at all Not difficult at all    BP Readings from Last 3 Encounters:  05/12/22 120/84  09/23/21 135/84  09/19/21 114/78    Physical Exam Vitals and nursing note reviewed.  Constitutional:      General: She is not in acute distress.    Appearance: She is well-developed.  HENT:     Head: Normocephalic and atraumatic.     Right Ear: Tympanic membrane and ear canal normal.     Left Ear: Tympanic membrane and ear canal normal.     Nose:     Right Sinus: No maxillary sinus tenderness.     Left Sinus: No maxillary sinus tenderness.  Eyes:     General: No scleral icterus.       Right eye: No discharge.        Left eye: No discharge.      Conjunctiva/sclera: Conjunctivae normal.  Neck:     Thyroid: No thyromegaly.     Vascular: No carotid bruit.  Cardiovascular:     Rate and Rhythm: Normal rate and regular rhythm.     Pulses: Normal pulses.     Heart sounds: Normal heart sounds.  Pulmonary:     Effort: Pulmonary effort is normal. No respiratory distress.     Breath sounds: No wheezing.  Chest:  Breasts:    Right: No mass, nipple discharge, skin change or tenderness.     Left: No mass, nipple discharge, skin change or tenderness.  Abdominal:     General: Bowel sounds are normal.     Palpations: Abdomen is soft.     Tenderness: There is no abdominal tenderness.  Musculoskeletal:     Cervical back: Normal range of motion. No erythema.     Right lower leg: No edema.     Left lower leg: No edema.  Lymphadenopathy:     Cervical: No cervical adenopathy.  Skin:    General: Skin is warm and dry.     Findings: No rash.  Neurological:     Mental Status: She is alert and oriented to person, place, and time.     Cranial Nerves: No cranial nerve deficit.     Sensory: No sensory deficit.     Deep Tendon Reflexes: Reflexes are normal and symmetric.  Psychiatric:        Attention and Perception: Attention normal.        Mood and Affect: Mood normal.     Wt Readings from Last 3 Encounters:  05/12/22 223 lb (101.2 kg)  10/08/21 250 lb (113.4 kg)  09/19/21 259 lb (117.5 kg)    BP 120/84   Pulse 85   Ht $R'5\' 3"'Tu$  (1.6 m)   Wt 223 lb (101.2 kg)   SpO2 97%   BMI 39.50 kg/m   Assessment and Plan: 1. Annual physical exam Normal exam She has lost about 60 lbs  with diet, exercise and Mounjaro Flu vaccine and Shingles given today.  2. Encounter for screening mammogram for breast cancer Schedule at [facility] - MM 3D SCREEN BREAST BILATERAL  3. Type II diabetes mellitus with complication (HCC) Clinically stable by exam and report without s/s of hypoglycemia. DM complicated by hypertension and dyslipidemia. Tolerating  medications well without side effects or other concerns. Will increase Mounjaro to 7.5 mg. - Comprehensive metabolic panel - Hemoglobin A1c - Microalbumin / creatinine urine ratio - TSH - tirzepatide (MOUNJARO) 7.5 MG/0.5ML Pen; Inject 7.5 mg into the skin once a week.  Dispense: 6 mL; Refill: 1  4. Hyperlipidemia associated with type 2 diabetes mellitus ([place]) Not currently on any statin therapy Depending on a1c and LDL, may need to consider statin therapy - Lipid panel  5. GERD without esophagitis Symptoms well controlled on daily PPI No red flag signs such as weight loss, n/v, melena Will continue omeprazole - CBC with Differential/Platelet - omeprazole (PRILOSEC) 20 MG capsule; Take 1 capsule (20 mg total) by mouth in the morning and at bedtime.  Dispense: 180 capsule; Refill: 0  6. OSA (obstructive sleep apnea)  7. Other dietary vitamin B12 deficiency anemia Will check levels and advise - Vitamin B12  8. Chronic iron deficiency anemia Continue iron supplements Check labs Still having regular menses so will likely need to continue  - CBC with Differential/Platelet - Iron, TIBC and Ferritin Panel  9. Need for immunization against influenza - Flu Vaccine QUAD 39moIM (Fluarix, Fluzone & Alfiuria Quad PF)  10. Exercise-induced asthma Stable mild symptoms respond well to Albuterol MDI - albuterol (VENTOLIN HFA) 108 (90 Base) MCG/ACT inhaler; Inhale 2 puffs into the lungs every 6 (six) hours as needed for wheezing or shortness of breath.  Dispense: 54 g; Refill: 1   11. Need for shingles vaccine First dose today Second dose with next OV - Varicella-zoster vaccine IM   Partially dictated using DEditor, commissioning Any errors are unintentional.  LHalina Maidens MD [facility]  05/12/2022

## 2022-05-13 LAB — CBC WITH DIFFERENTIAL/PLATELET
Basophils Absolute: 0 10*3/uL (ref 0.0–0.2)
Basos: 0 %
EOS (ABSOLUTE): 0 10*3/uL (ref 0.0–0.4)
Eos: 0 %
Hematocrit: 36.5 % (ref 34.0–46.6)
Hemoglobin: 11.5 g/dL (ref 11.1–15.9)
Immature Grans (Abs): 0 10*3/uL (ref 0.0–0.1)
Immature Granulocytes: 0 %
Lymphocytes Absolute: 2 10*3/uL (ref 0.7–3.1)
Lymphs: 28 %
MCH: 24.9 pg — ABNORMAL LOW (ref 26.6–33.0)
MCHC: 31.5 g/dL (ref 31.5–35.7)
MCV: 79 fL (ref 79–97)
Monocytes Absolute: 0.6 10*3/uL (ref 0.1–0.9)
Monocytes: 8 %
Neutrophils Absolute: 4.5 10*3/uL (ref 1.4–7.0)
Neutrophils: 64 %
Platelets: 249 10*3/uL (ref 150–450)
RBC: 4.61 x10E6/uL (ref 3.77–5.28)
RDW: 15.4 % (ref 11.7–15.4)
WBC: 7.2 10*3/uL (ref 3.4–10.8)

## 2022-05-13 LAB — COMPREHENSIVE METABOLIC PANEL
ALT: 6 IU/L (ref 0–32)
AST: 12 IU/L (ref 0–40)
Albumin/Globulin Ratio: 1.7 (ref 1.2–2.2)
Albumin: 4.2 g/dL (ref 3.8–4.9)
Alkaline Phosphatase: 74 IU/L (ref 44–121)
BUN/Creatinine Ratio: 22 (ref 9–23)
BUN: 20 mg/dL (ref 6–24)
Bilirubin Total: 0.3 mg/dL (ref 0.0–1.2)
CO2: 24 mmol/L (ref 20–29)
Calcium: 9.7 mg/dL (ref 8.7–10.2)
Chloride: 101 mmol/L (ref 96–106)
Creatinine, Ser: 0.89 mg/dL (ref 0.57–1.00)
Globulin, Total: 2.5 g/dL (ref 1.5–4.5)
Glucose: 86 mg/dL (ref 70–99)
Potassium: 4.1 mmol/L (ref 3.5–5.2)
Sodium: 138 mmol/L (ref 134–144)
Total Protein: 6.7 g/dL (ref 6.0–8.5)
eGFR: 78 mL/min/{1.73_m2} (ref >59)

## 2022-05-13 LAB — IRON,TIBC AND FERRITIN PANEL
Ferritin: 13 ng/mL — ABNORMAL LOW (ref 15–150)
Iron Saturation: 9 % — CL (ref 15–55)
Iron: 30 ug/dL (ref 27–159)
Total Iron Binding Capacity: 347 ug/dL (ref 250–450)
UIBC: 317 ug/dL (ref 131–425)

## 2022-05-13 LAB — HEMOGLOBIN A1C
Est. average glucose Bld gHb Est-mCnc: 105 mg/dL
Hgb A1c MFr Bld: 5.3 % (ref 4.8–5.6)

## 2022-05-13 LAB — LIPID PANEL
Chol/HDL Ratio: 3.8 ratio (ref 0.0–4.4)
Cholesterol, Total: 208 mg/dL — ABNORMAL HIGH (ref 100–199)
HDL: 55 mg/dL (ref >39)
LDL Chol Calc (NIH): 137 mg/dL — ABNORMAL HIGH (ref 0–99)
Triglycerides: 89 mg/dL (ref 0–149)
VLDL Cholesterol Cal: 16 mg/dL (ref 5–40)

## 2022-05-13 LAB — MICROALBUMIN / CREATININE URINE RATIO
Creatinine, Urine: 345.4 mg/dL
Microalb/Creat Ratio: 6 mg/g creat (ref 0–29)
Microalbumin, Urine: 21.8 ug/mL

## 2022-05-13 LAB — TSH: TSH: 0.64 u[IU]/mL (ref 0.450–4.500)

## 2022-05-13 LAB — VITAMIN B12: Vitamin B-12: 470 pg/mL (ref 232–1245)

## 2022-06-10 ENCOUNTER — Other Ambulatory Visit: Payer: Self-pay

## 2022-06-19 ENCOUNTER — Ambulatory Visit: Payer: No Typology Code available for payment source

## 2022-07-07 ENCOUNTER — Ambulatory Visit
Admission: RE | Admit: 2022-07-07 | Discharge: 2022-07-07 | Disposition: A | Discharge status: Home / Self Care | Payer: BC Managed Care – PPO | Source: Ambulatory Visit | Attending: Internal Medicine | Admitting: Internal Medicine

## 2022-07-07 DIAGNOSIS — Z1231 Encounter for screening mammogram for malignant neoplasm of breast: Secondary | ICD-10-CM | POA: Diagnosis not present

## 2022-07-10 ENCOUNTER — Other Ambulatory Visit: Payer: Self-pay

## 2022-08-11 ENCOUNTER — Other Ambulatory Visit: Payer: Self-pay

## 2022-09-12 ENCOUNTER — Encounter: Payer: Self-pay | Admitting: Internal Medicine

## 2022-09-12 ENCOUNTER — Other Ambulatory Visit: Payer: Self-pay

## 2022-09-12 ENCOUNTER — Ambulatory Visit: Payer: BC Managed Care – PPO | Admitting: Internal Medicine

## 2022-09-12 VITALS — BP 126/68 | HR 81 | Ht 63.0 in | Wt 216.2 lb

## 2022-09-12 DIAGNOSIS — D509 Iron deficiency anemia, unspecified: Secondary | ICD-10-CM | POA: Diagnosis not present

## 2022-09-12 DIAGNOSIS — E118 Type 2 diabetes mellitus with unspecified complications: Secondary | ICD-10-CM | POA: Diagnosis not present

## 2022-09-12 DIAGNOSIS — Z23 Encounter for immunization: Secondary | ICD-10-CM

## 2022-09-12 LAB — POCT GLYCOSYLATED HEMOGLOBIN (HGB A1C): Hemoglobin A1C: 5.1 % (ref 4.0–5.6)

## 2022-09-12 MED ORDER — TIRZEPATIDE 10 MG/0.5ML ~~LOC~~ SOAJ
10.0000 mg | SUBCUTANEOUS | 1 refills | Status: DC
  Filled 2022-09-12: qty 2, 28d supply, fill #0
  Filled 2022-10-21: qty 2, 28d supply, fill #1
  Filled 2022-11-15: qty 2, 28d supply, fill #2
  Filled 2022-12-19: qty 2, 28d supply, fill #3

## 2022-09-12 NOTE — Progress Notes (Signed)
Date:  09/12/2022   Name:  Kaitlyn Mcdaniel   DOB:  07-26-1971   MRN:  [id number]   Chief Complaint: Diabetes  Diabetes She presents for her follow-up diabetic visit. She has type 2 diabetes mellitus. Her disease course has been improving. Pertinent negatives for hypoglycemia include no headaches or tremors. Pertinent negatives for diabetes include no chest pain, no fatigue, no polydipsia and no polyuria. Current diabetic treatments: mounjaro. Her weight is decreasing steadily. Her home blood glucose trend is decreasing steadily.    Lab Results  Component Value Date   NA 138 05/12/2022   K 4.1 05/12/2022   CO2 24 05/12/2022   GLUCOSE 86 05/12/2022   BUN 20 05/12/2022   CREATININE 0.89 05/12/2022   CALCIUM 9.7 05/12/2022   EGFR 78 05/12/2022   GFRNONAA 68 04/11/2020   Lab Results  Component Value Date   CHOL 208 (H) 05/12/2022   HDL 55 05/12/2022   LDLCALC 137 (H) 05/12/2022   TRIG 89 05/12/2022   CHOLHDL 3.8 05/12/2022   Lab Results  Component Value Date   TSH 0.640 05/12/2022   Lab Results  Component Value Date   HGBA1C 5.1 09/12/2022   Lab Results  Component Value Date   WBC 7.2 05/12/2022   HGB 11.5 05/12/2022   HCT 36.5 05/12/2022   MCV 79 05/12/2022   PLT 249 05/12/2022   Lab Results  Component Value Date   ALT 6 05/12/2022   AST 12 05/12/2022   ALKPHOS 74 05/12/2022   BILITOT 0.3 05/12/2022   Lab Results  Component Value Date   VD25OH 39.7 04/11/2020     Review of Systems  Constitutional:  Negative for appetite change, fatigue, fever and unexpected weight change.  HENT:  Negative for tinnitus and trouble swallowing.   Eyes:  Negative for visual disturbance.  Respiratory:  Negative for cough, chest tightness and shortness of breath.   Cardiovascular:  Negative for chest pain, palpitations and leg swelling.  Gastrointestinal:  Negative for abdominal pain.  Endocrine: Negative for polydipsia and polyuria.  Genitourinary:  Negative for dysuria  and hematuria.  Musculoskeletal:  Negative for arthralgias.  Neurological:  Negative for tremors, numbness and headaches.  Psychiatric/Behavioral:  Negative for dysphoric mood.     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   Status post total knee replacement, left 09/02/2016   Type II diabetes mellitus with complication ([place]) 83/25/4982   OSA (obstructive sleep apnea) 08/06/2015   Exercise-induced asthma 04/29/2015   GERD without esophagitis 04/29/2015   Hx of abnormal cervical Papanicolaou smear 04/29/2015   Degenerative arthritis of knee, bilateral 03/08/2015    No Known Allergies  Past Surgical History:  Procedure Laterality Date   [place]   abnormal PAP   REDUCTION MAMMAPLASTY Bilateral 1999   TOTAL KNEE ARTHROPLASTY Left 06/2016   TOTAL KNEE ARTHROPLASTY Right 09/2016    Social History   Tobacco Use   Smoking status:  Never   Smokeless tobacco: Never  Vaping Use   Vaping Use: Never used  Substance Use Topics   Alcohol use: Yes    Alcohol/week: 1.0 standard drink of alcohol    Types: 1 Glasses of wine per week   Drug use: No     Medication list has been reviewed and updated.  Current Meds  Medication Sig   acetaminophen (TYLENOL) 500 MG tablet Take 1,000 mg by mouth every 6 (six) hours as needed.   albuterol (VENTOLIN HFA) 108 (90 Base) MCG/ACT inhaler Inhale 2 puffs into the lungs  every 6 (six) hours as needed for wheezing or shortness of breath.   cetirizine (ZYRTEC) 10 MG tablet Take 10 mg by mouth daily as needed for allergies.   ferrous gluconate (FERGON) 324 MG tablet Take 1 tablet (324 mg total) by mouth daily.   mirabegron ER (MYRBETRIQ) 50 MG TB24 tablet Take 1 tablet (50 mg total) by mouth daily.   Naproxen Sodium (ALEVE PO) Take 2 tablets by mouth as needed.   NON FORMULARY CPAP at night   omeprazole (PRILOSEC) 20 MG capsule Take 1 capsule (20 mg total) by mouth in the morning and at bedtime.   Probiotic Product (PROBIOTIC-10 PO) Take 1 capsule by mouth daily as needed.   tirzepatide (MOUNJARO) 10 MG/0.5ML Pen Inject 10 mg into the skin once a week.   [DISCONTINUED] tirzepatide (MOUNJARO) 7.5 MG/0.5ML Pen Inject 7.5 mg into the skin once a week.       09/12/2022   10:57 AM 05/12/2022   10:48 AM 10/08/2021   11:11 AM 09/19/2021    8:02 AM  GAD 7 : Generalized Anxiety Score  Nervous, Anxious, on Edge 0 0 0 0  Control/stop worrying 0 0 0 0  Worry too much - different things 0 0 0 0  Trouble relaxing 0 0 0 0  Restless 0 0 0 0  Easily annoyed or irritable 0 0 0 0  Afraid - awful might happen 0 0 0 0  Total GAD 7 Score 0 0 0 0  Anxiety Difficulty Not difficult at all Not difficult at all Not difficult at all        09/12/2022   10:57 AM 05/12/2022   10:48 AM 10/08/2021   11:11 AM  Depression screen PHQ 2/9  Decreased Interest 0 0 0  Down, Depressed, Hopeless 0 0 0  PHQ - 2 Score 0 0 0  Altered sleeping 2 2 0  Tired, decreased energy 1 1 0  Change in appetite 0 0 0  Feeling bad or failure about yourself  0 0 0  Trouble concentrating 0 0 0  Moving slowly or fidgety/restless 0 0 0  Suicidal thoughts 0 0 0  PHQ-9 Score 3 3 0  Difficult doing work/chores Not difficult at all Not difficult at all Not difficult at all    BP Readings from Last 3 Encounters:  09/12/22 126/68  05/12/22 120/84  09/23/21 135/84    Physical Exam Vitals and nursing  note reviewed.  Constitutional:      General: She is not in acute distress.    Appearance: She is well-developed.  HENT:     Head: Normocephalic and atraumatic.  Cardiovascular:     Rate and Rhythm: Normal rate and regular rhythm.     Pulses: Normal pulses.  Pulmonary:     Effort: Pulmonary effort is normal. No respiratory distress.     Breath sounds: No wheezing or rhonchi.  Musculoskeletal:  Cervical back: Normal range of motion.     Right lower leg: No edema.     Left lower leg: No edema.  Lymphadenopathy:     Cervical: No cervical adenopathy.  Skin:    General: Skin is warm and dry.     Findings: No rash.  Neurological:     Mental Status: She is alert and oriented to person, place, and time.  Psychiatric:        Mood and Affect: Mood normal.        Behavior: Behavior normal.    Diabetic Foot Exam - Simple   Simple Foot Form Diabetic Foot exam was performed with the following findings: Yes 09/12/2022 10:59 AM  Visual Inspection No deformities, no ulcerations, no other skin breakdown bilaterally: Yes Sensation Testing Intact to touch and monofilament testing bilaterally: Yes Pulse Check Posterior Tibialis and Dorsalis pulse intact bilaterally: Yes Comments      Wt Readings from Last 3 Encounters:  09/12/22 216 lb 3.2 oz (98.1 kg)  05/12/22 223 lb (101.2 kg)  10/08/21 250 lb (113.4 kg)    BP 126/68   Pulse 81   Ht _0  (1.6 m)   Wt 216 lb 3.2 oz (98.1 kg)   SpO2 97%   BMI 38.30 kg/m   Assessment and Plan: Problem List Items Addressed This Visit       Endocrine   Type II diabetes mellitus with complication (HCC) - Primary (Chronic)    Last A1c 5.3 on Mounjaro.  5.1 today Excellent weight loss without significant side effects She is ready to increase to Mounjaro 10 mg      Relevant Medications   tirzepatide (MOUNJARO) 10 MG/0.5ML Pen   Other Relevant Orders   POCT glycosylated hemoglobin (Hb A1C) (Completed)     Other   Chronic iron  deficiency anemia (Chronic)    Chronically low iron levels with normal CBC On daily iron supplement Cologuard 05/2020 negative; no colonoscopy      Other Visit Diagnoses     Need for shingles vaccine       Relevant Orders   Varicella-zoster vaccine IM (Completed)   Encounter for immunization       Relevant Orders   Pfizer Fall 2023 Covid-19 Vaccine 36yr and older (Completed)        Partially dictated using DEditor, commissioning Any errors are unintentional.  LHalina Maidens MD [facility]  09/12/2022

## 2022-09-12 NOTE — Assessment & Plan Note (Addendum)
Last A1c 5.3 on Mounjaro.  5.1 today Excellent weight loss without significant side effects She is ready to increase to Mounjaro 10 mg

## 2022-09-12 NOTE — Assessment & Plan Note (Signed)
Chronically low iron levels with normal CBC On daily iron supplement Cologuard 05/2020 negative; no colonoscopy

## 2022-11-17 ENCOUNTER — Other Ambulatory Visit: Payer: Self-pay

## 2022-11-17 ENCOUNTER — Other Ambulatory Visit: Payer: Self-pay | Admitting: Internal Medicine

## 2022-11-17 DIAGNOSIS — K219 Gastro-esophageal reflux disease without esophagitis: Secondary | ICD-10-CM

## 2022-11-17 MED ORDER — OMEPRAZOLE 20 MG PO CPDR: 20.0000 mg | DELAYED_RELEASE_CAPSULE | Freq: Two times a day (BID) | ORAL | 0 refills | Status: DC

## 2022-12-19 ENCOUNTER — Other Ambulatory Visit: Payer: Self-pay

## 2022-12-19 ENCOUNTER — Other Ambulatory Visit: Payer: Self-pay | Admitting: Internal Medicine

## 2022-12-19 DIAGNOSIS — E118 Type 2 diabetes mellitus with unspecified complications: Secondary | ICD-10-CM

## 2022-12-19 NOTE — Telephone Encounter (Signed)
Requested medication (s) are due for refill today: yes  Requested medication (s) are on the active medication list: yes  Last refill:  09/12/22  Future visit scheduled: yes  Notes to clinic:   Medication not assigned to a protocol, review manually.      Requested Prescriptions  Pending Prescriptions Disp Refills   tirzepatide (MOUNJARO) 10 MG/0.5ML Pen 6 mL 1    Sig: Inject 10 mg into the skin once a week.     Off-Protocol Failed - 12/19/2022  7:49 AM      Failed - Medication not assigned to a protocol, review manually.      Passed - Valid encounter within last 12 months    Recent Outpatient Visits           3 months ago Type II diabetes mellitus with complication [facility])   [place] [facility] at [facility], Nyoka Cowden, MD   7 months ago Annual physical exam   [facility] at [facility], Nyoka Cowden, MD   1 year ago COVID-19 virus infection   [place] [facility] Medicine at [facility], Nyoka Cowden, MD   1 year ago Type II diabetes mellitus with complication [facility])   [place] [facility] Medicine at [facility], Nyoka Cowden, MD   1 year ago Type II diabetes mellitus with complication [facility])   [place] [facility] Medicine at [facility], Nyoka Cowden, MD       Future Appointments             In 1 month [name], Nyoka Cowden, MD [facility] Primary Care & Sports Medicine at [facility], [facility]   In 5 months [name], Nyoka Cowden, MD [facility] Primary Care & Sports Medicine at [facility], [facility]

## 2022-12-21 ENCOUNTER — Other Ambulatory Visit: Payer: Self-pay | Admitting: Internal Medicine

## 2022-12-21 ENCOUNTER — Other Ambulatory Visit: Payer: Self-pay

## 2022-12-21 DIAGNOSIS — E118 Type 2 diabetes mellitus with unspecified complications: Secondary | ICD-10-CM

## 2022-12-22 ENCOUNTER — Other Ambulatory Visit: Payer: Self-pay

## 2022-12-23 NOTE — Telephone Encounter (Signed)
Requested medication (s) are due for refill today - yes  Requested medication (s) are on the active medication list -yes  Future visit scheduled -yes  Last refill: 09/12/22 29ml 1RF  Notes to clinic: Off protocol- provider review    Requested Prescriptions  Pending Prescriptions Disp Refills   MOUNJARO 10 MG/0.5ML Pen [Pharmacy Med Name: tirzepatide (MOUNJARO) 10 MG/0.5ML Pen] 6 mL 1    Sig: Inject 10 mg into the skin once a week.     Off-Protocol Failed - 12/21/2022 11:59 PM      Failed - Medication not assigned to a protocol, review manually.      Passed - Valid encounter within last 12 months    Recent Outpatient Visits           3 months ago Type II diabetes mellitus with complication [facility])   [place] [facility] Medicine at [facility], Nyoka Cowden, MD   7 months ago Annual physical exam   [facility] Primary Care & Sports [facility] at [facility], Nyoka Cowden, MD   1 year ago COVID-19 virus infection   [place] Primary Care [facility] Sports Medicine at [facility], Nyoka Cowden, MD   1 year ago Type II diabetes mellitus with complication [facility])   [place] [facility] & Sports Medicine at [facility], Nyoka Cowden, MD   1 year ago Type II diabetes mellitus with complication [facility])   [place] Primary Care & Sports Medicine at [facility], Nyoka Cowden, MD       Future Appointments             In 1 month [name] Nyoka Cowden, MD [facility] Primary [facility] at [facility], [facility]   In 4 months [name] Nyoka Cowden, MD [facility] Primary Care & Sports Medicine at [facility], [facility]               Requested Prescriptions  Pending Prescriptions Disp Refills   MOUNJARO 10 MG/0.5ML Pen [Pharmacy Med Name: tirzepatide [facility]) 10 MG/0.5ML Pen] 6 mL 1    Sig: Inject 10 mg into the skin once a week.     Off-Protocol Failed - 12/21/2022 11:59 PM      Failed - Medication not assigned to a protocol,  review manually.      Passed - Valid encounter within last 12 months    Recent Outpatient Visits           3 months ago Type II diabetes mellitus with complication [facility])   [place] [facility] Medicine at [facility], Nyoka Cowden, MD   7 months ago Annual physical exam   [facility] Medicine at [facility], Nyoka Cowden, MD   1 year ago COVID-19 virus infection   [place] [facility] Care & Sports Medicine at [facility], Nyoka Cowden, MD   1 year ago Type II diabetes mellitus with complication [facility])   [place] Primary Care & Sports Medicine at [facility], Nyoka Cowden, MD   1 year ago Type II diabetes mellitus with complication [facility])    Primary Care & Sports Medicine at [facility], Nyoka Cowden, MD       Future Appointments             In 1 month [name], Nyoka Cowden, MD [facility] Primary Care & Sports Medicine at [facility], [facility]   In 4  months Reubin Milan, MD [facility] Primary Care & Sports Medicine at [facility], [facility]

## 2022-12-25 ENCOUNTER — Encounter: Payer: Self-pay | Admitting: Internal Medicine

## 2022-12-25 ENCOUNTER — Other Ambulatory Visit: Payer: Self-pay | Admitting: Internal Medicine

## 2022-12-25 ENCOUNTER — Other Ambulatory Visit: Payer: Self-pay

## 2022-12-25 DIAGNOSIS — E118 Type 2 diabetes mellitus with unspecified complications: Secondary | ICD-10-CM

## 2022-12-25 MED ORDER — TIRZEPATIDE 12.5 MG/0.5ML ~~LOC~~ SOAJ
12.5000 mg | SUBCUTANEOUS | 0 refills | Status: DC
  Filled 2022-12-25: qty 2, 28d supply, fill #0
  Filled 2023-01-22: qty 2, 28d supply, fill #1
  Filled 2023-02-27: qty 2, 28d supply, fill #2

## 2022-12-25 NOTE — Telephone Encounter (Signed)
Response.  KP

## 2022-12-25 NOTE — Telephone Encounter (Signed)
Change in dose

## 2023-01-06 ENCOUNTER — Other Ambulatory Visit: Payer: Self-pay

## 2023-01-06 ENCOUNTER — Ambulatory Visit: Payer: BC Managed Care – PPO | Admitting: Family Medicine

## 2023-01-06 ENCOUNTER — Ambulatory Visit
Admission: RE | Admit: 2023-01-06 | Discharge: 2023-01-06 | Disposition: A | Discharge status: Home / Self Care | Payer: BC Managed Care – PPO | Attending: Family Medicine | Admitting: Family Medicine

## 2023-01-06 ENCOUNTER — Encounter: Payer: Self-pay | Admitting: Family Medicine

## 2023-01-06 ENCOUNTER — Ambulatory Visit
Admission: RE | Admit: 2023-01-06 | Discharge: 2023-01-06 | Disposition: A | Discharge status: Home / Self Care | Payer: BC Managed Care – PPO | Source: Ambulatory Visit | Attending: Family Medicine | Admitting: Family Medicine

## 2023-01-06 VITALS — BP 110/62 | HR 72 | Ht 63.0 in | Wt 197.0 lb

## 2023-01-06 DIAGNOSIS — R1030 Lower abdominal pain, unspecified: Secondary | ICD-10-CM | POA: Diagnosis not present

## 2023-01-06 DIAGNOSIS — M533 Sacrococcygeal disorders, not elsewhere classified: Secondary | ICD-10-CM | POA: Diagnosis not present

## 2023-01-06 DIAGNOSIS — M25851 Other specified joint disorders, right hip: Secondary | ICD-10-CM | POA: Insufficient documentation

## 2023-01-06 DIAGNOSIS — M25551 Pain in right hip: Secondary | ICD-10-CM

## 2023-01-06 MED ORDER — MELOXICAM 15 MG PO TABS
15.0000 mg | ORAL_TABLET | Freq: Every day | ORAL | 0 refills | Status: DC
Start: 2023-01-06 — End: 2023-01-27
  Filled 2023-01-06 (×2): qty 30, 30d supply, fill #0

## 2023-01-06 NOTE — Assessment & Plan Note (Addendum)
Noted over the past several weeks in the setting of chronic ipsilateral SI joint pain.  Focal to the groin without radiation, no paresthesias throughout the leg, aggravated with weightbearing, intermittent near buckling episodes, particularly after position change.  Denies any specific change in activities at onset.  - Obtain x-rays - Start meloxicam x 2 weeks scheduled - Close follow-up in 2 weeks to assess response, can further escalate intra-articular corticosteroid injection if needed - Start formal PT over interim

## 2023-01-06 NOTE — Assessment & Plan Note (Signed)
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  Examination demonstrates focal tenderness at the right SI joint and at the right greater trochanter, throughout the gluteus medius/minimus, positive FADIR localizing to the right groin, positive Faber localizing to the groin and SI joint, negative straight leg raise, negative piriformis stretching, equivocal iliopsoas testing.  SI joint arthralgia with secondary/compensatory right hip arthralgia, given chronicity of symptoms, osteoarthritis considered.  - Obtain x-rays - Start meloxicam x 2 weeks scheduled - Close follow-up in 2 weeks to assess response, can further escalate intra-articular corticosteroid injection if needed - Start formal PT over interim

## 2023-01-06 NOTE — Patient Instructions (Signed)
-   Obtain x-rays - Start meloxicam daily (take with food, no other NSAIDs while this medication) - Referral coordinator will contact you in regards to PT scheduling - Return for follow-up in 2 weeks

## 2023-01-06 NOTE — Progress Notes (Signed)
     Primary Care / Sports Medicine Office Visit  Patient Information:  Patient ID: Kaitlyn Mcdaniel, female DOB: 1971-01-02 Age: 52 y.o. MRN: [id number]   Kaitlyn Mcdaniel is a pleasant 52 y.o. female presenting with the following:  Chief Complaint  Patient presents with   Hip Pain    This episode for 1 month, no images, no treatment, SI joint is all the time and right hip is at times    Vitals:   01/06/23 0935  BP: 110/62  Pulse: 72   Vitals:   01/06/23 0935  Weight: 197 lb (89.4 kg)  Height:  (1.6 m)   Body mass index is 34.9 kg/m.  No results found.   Independent interpretation of notes and tests performed by another provider:   None  Procedures performed:   None  Pertinent History, Exam, Impression, and Recommendations:   Kaitlyn Mcdaniel was seen today for hip pain.  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  Examination demonstrates focal tenderness at the right SI joint and at the right greater trochanter, throughout the gluteus medius/minimus, positive FADIR localizing to the right groin, positive Faber localizing to the groin and SI joint, negative straight leg raise, negative piriformis stretching, equivocal iliopsoas testing.  SI joint arthralgia with secondary/compensatory right hip arthralgia, given chronicity of symptoms, osteoarthritis considered.  - Obtain x-rays - Start meloxicam x 2 weeks scheduled - Close follow-up in 2 weeks to assess response, can further escalate intra-articular corticosteroid injection if needed - Start formal PT over interim  Orders: -     DG HIP UNILAT W OR W/O PELVIS 2-3 VIEWS RIGHT; Future -     DG Sacrum/Coccyx; Future -     Meloxicam; Take 1 tablet (15 mg total) by mouth daily.  Dispense: 30 tablet; Refill: 0 -     Ambulatory referral to Physical Therapy  Arthralgia of right hip Assessment  & Plan: Noted over the past several weeks in the setting of chronic ipsilateral SI joint pain.  Focal to the groin without radiation, no paresthesias throughout the leg, aggravated with weightbearing, intermittent near buckling episodes, particularly after position change.  Denies any specific change in activities at onset.  - Obtain x-rays - Start meloxicam x 2 weeks scheduled - Close follow-up in 2 weeks to assess response, can further escalate intra-articular corticosteroid injection if needed - Start formal PT over interim  Orders: -     DG HIP UNILAT W OR W/O PELVIS 2-3 VIEWS RIGHT; Future -     DG Sacrum/Coccyx; Future -     Meloxicam; Take 1 tablet (15 mg total) by mouth daily.  Dispense: 30 tablet; Refill: 0 -     Ambulatory referral to Physical Therapy     Orders & Medications Meds ordered this encounter  Medications   meloxicam (MOBIC) 15 MG tablet    Sig: Take 1 tablet (15 mg total) by mouth daily.    Dispense:  30 tablet    Refill:  0   Orders Placed This Encounter  Procedures   DG Hip Unilat W OR W/O Pelvis 2-3 Views Right   DG Sacrum/Coccyx   Ambulatory referral to Physical Therapy     Return in about 2 weeks (around 01/20/2023).     Kaitlyn Banana, MD, [facility]   Primary Care Sports Medicine Primary Care and Sports Medicine at [facility]

## 2023-01-16 NOTE — Progress Notes (Signed)
LVM with front desk, states he will have it reread

## 2023-01-20 ENCOUNTER — Ambulatory Visit: Payer: BC Managed Care – PPO | Admitting: Family Medicine

## 2023-01-27 ENCOUNTER — Other Ambulatory Visit (INDEPENDENT_AMBULATORY_CARE_PROVIDER_SITE_OTHER): Payer: BC Managed Care – PPO | Admitting: Radiology

## 2023-01-27 ENCOUNTER — Ambulatory Visit: Payer: BC Managed Care – PPO | Admitting: Family Medicine

## 2023-01-27 ENCOUNTER — Encounter: Payer: Self-pay | Admitting: Family Medicine

## 2023-01-27 ENCOUNTER — Other Ambulatory Visit: Payer: Self-pay

## 2023-01-27 VITALS — BP 128/78 | HR 72 | Ht 63.0 in | Wt 199.0 lb

## 2023-01-27 DIAGNOSIS — M25851 Other specified joint disorders, right hip: Secondary | ICD-10-CM

## 2023-01-27 DIAGNOSIS — M47817 Spondylosis without myelopathy or radiculopathy, lumbosacral region: Secondary | ICD-10-CM

## 2023-01-27 DIAGNOSIS — M25859 Other specified joint disorders, unspecified hip: Secondary | ICD-10-CM

## 2023-01-27 DIAGNOSIS — M533 Sacrococcygeal disorders, not elsewhere classified: Secondary | ICD-10-CM

## 2023-01-27 MED ORDER — DICLOFENAC SODIUM 75 MG PO TBEC
75.0000 mg | DELAYED_RELEASE_TABLET | Freq: Two times a day (BID) | ORAL | 0 refills | Status: DC
Start: 2023-01-27 — End: 2024-07-20
  Filled 2023-01-27: qty 30, 15d supply, fill #0

## 2023-01-27 NOTE — Assessment & Plan Note (Signed)
X-rays do demonstrate pincer type change at the hip joints bilaterally, consistent with symptomatology, examination localizes pain to the right hip joint as well.  Given her primary concern over SI joint pain, we discussed the correlation of hip and low back findings, plan as follows:  - Proceed with right hip intra-articular corticosteroid injection - Trial of diclofenac twice daily as needed - Formal PT - Will follow PT progress notes

## 2023-01-27 NOTE — Assessment & Plan Note (Signed)
Incidentally noted on recent sacroiliac joint x-rays, discussed the relation of these findings to her noted pain.  Plan as follows: - Trial of as needed diclofenac 75 mg - Formal physical therapy - Referral to interventional spine group for additional management options - MRI lumbar spine for further evaluation, anticipated procedural planning - Will follow MRI results and PT progress notes

## 2023-01-27 NOTE — Assessment & Plan Note (Signed)
Recent x-rays do demonstrate reassuring SI findings, this most likely represents referred/secondary pain due to underlying lumbosacral and right hip joint findings. See additional assessment(s) for plan details.

## 2023-01-27 NOTE — Patient Instructions (Signed)
You have just been given a cortisone injection to reduce pain and inflammation. After the injection you may notice immediate relief of pain as a result of the Lidocaine. It is important to rest the area of the injection for 24 to 48 hours after the injection. There is a possibility of some temporary increased discomfort and swelling for up to 72 hours until the cortisone begins to work. If you do have pain, simply rest the joint and use ice. If you can tolerate over the counter medications, you can try Tylenol, Aleve, or Advil for added relief per package instructions. 

## 2023-02-01 NOTE — Progress Notes (Signed)
Primary Care / Sports Medicine Office Visit  Patient Information:  Patient ID: Kaitlyn Mcdaniel, female DOB: Feb 02, 1971 Age: 52 y.o. MRN: [id number]   Kaitlyn Mcdaniel is a pleasant 52 y.o. female presenting with the following:  Chief Complaint  Patient presents with   Sacroiliac joint pain    Vitals:   01/27/23 0755  BP: 128/78  Pulse: 72  SpO2: 98%   Vitals:   01/27/23 0755  Weight: 199 lb (90.3 kg)  Height: 5\' 3"  (1.6 m)   Body mass index is 35.25 kg/m.     Independent interpretation of notes and tests performed by another provider:   Independent interpretation with minimal inferior SI joint degenerative changes, right hip with pincer deformation changes note, limited view of lumbar spine with mild degenerative changes noted, no acute process.  Procedures performed:   Procedure:  Injection of right hip under ultrasound guidance. Ultrasound guidance utilized for in-plane approach, no effusion Samsung HS60 device utilized with permanent recording / reporting. Verbal informed consent obtained and verified. Skin prepped in a sterile fashion. Ethyl chloride for topical local analgesia.  Completed without difficulty and tolerated well. Medication: triamcinolone acetonide 40 mg/mL suspension for injection 1 mL total and 2 mL lidocaine 1% without epinephrine utilized for needle placement anesthetic Advised to contact for fevers/chills, erythema, induration, drainage, or persistent bleeding.   Pertinent History, Exam, Impression, and Recommendations:   Kaitlyn Mcdaniel was seen today for sacroiliac joint pain.  Femoral acetabular impingement Assessment & Plan: X-rays do demonstrate pincer type change at the hip joints bilaterally, consistent with symptomatology, examination localizes pain to the right hip joint as well.  Given her primary concern over SI joint pain, we discussed the correlation of hip and low back findings, plan as follows:  - Proceed with right hip  intra-articular corticosteroid injection - Trial of diclofenac twice daily as needed - Formal PT - Will follow PT progress notes  Orders: -     [place] LIMITED JOINT SPACE STRUCTURES LOW RIGHT; Future -     Diclofenac Sodium; Take 1 tablet (75 mg total) by mouth 2 (two) times daily.  Dispense: 30 tablet; Refill: 0  Spondylosis of lumbosacral region without myelopathy or radiculopathy Assessment & Plan: Incidentally noted on recent sacroiliac joint x-rays, discussed the relation of these findings to her noted pain.  Plan as follows: - Trial of as needed diclofenac 75 mg - Formal physical therapy - Referral to interventional spine group for additional management options - MRI lumbar spine for further evaluation, anticipated procedural planning - Will follow MRI results and PT progress notes  Orders: -     MR LUMBAR SPINE WO CONTRAST; Future -     Ambulatory referral to Anesthesiology  Sacroiliac joint pain Assessment & Plan: Recent x-rays do demonstrate reassuring SI findings, this most likely represents referred/secondary pain due to underlying lumbosacral and right hip joint findings. See additional assessment(s) for plan details.  Orders: -     Ambulatory referral to Anesthesiology  Femoroacetabular impingement of right hip Assessment & Plan: X-rays do demonstrate pincer type change at the hip joints bilaterally, consistent with symptomatology, examination localizes pain to the right hip joint as well.  Given her primary concern over SI joint pain, we discussed the correlation of hip and low back findings, plan as follows:  - Proceed with right hip intra-articular corticosteroid injection - Trial of diclofenac twice daily as needed - Formal PT - Will follow PT progress notes      Orders &  Medications Meds ordered this encounter  Medications   diclofenac (VOLTAREN) 75 MG EC tablet    Sig: Take 1 tablet (75 mg total) by mouth 2 (two) times daily.    Dispense:  30 tablet     Refill:  0   Orders Placed This Encounter  Procedures   [place] LIMITED JOINT SPACE STRUCTURES LOW RIGHT   MR Lumbar Spine Wo Contrast   Ambulatory referral to Anesthesiology     No follow-ups on file.     Jerrol Banana, MD, [facility]   Primary Care Sports Medicine Primary Care and Sports Medicine at [facility]

## 2023-02-02 ENCOUNTER — Ambulatory Visit
Admission: RE | Admit: 2023-02-02 | Discharge: 2023-02-02 | Disposition: A | Discharge status: Home / Self Care | Payer: BC Managed Care – PPO | Source: Ambulatory Visit | Attending: Family Medicine | Admitting: Family Medicine

## 2023-02-02 DIAGNOSIS — M47817 Spondylosis without myelopathy or radiculopathy, lumbosacral region: Secondary | ICD-10-CM | POA: Insufficient documentation

## 2023-02-02 DIAGNOSIS — M5126 Other intervertebral disc displacement, lumbar region: Secondary | ICD-10-CM | POA: Diagnosis not present

## 2023-02-11 ENCOUNTER — Encounter: Payer: Self-pay | Admitting: Family Medicine

## 2023-02-11 ENCOUNTER — Ambulatory Visit: Payer: BC Managed Care – PPO | Admitting: Internal Medicine

## 2023-02-11 VITALS — BP 118/64 | HR 63 | Ht 63.0 in | Wt 191.0 lb

## 2023-02-11 DIAGNOSIS — Z7985 Long-term (current) use of injectable non-insulin antidiabetic drugs: Secondary | ICD-10-CM | POA: Diagnosis not present

## 2023-02-11 DIAGNOSIS — K219 Gastro-esophageal reflux disease without esophagitis: Secondary | ICD-10-CM

## 2023-02-11 DIAGNOSIS — Z79899 Other long term (current) drug therapy: Secondary | ICD-10-CM

## 2023-02-11 DIAGNOSIS — E119 Type 2 diabetes mellitus without complications: Secondary | ICD-10-CM

## 2023-02-11 MED ORDER — OMEPRAZOLE 20 MG PO CPDR
20.0000 mg | DELAYED_RELEASE_CAPSULE | Freq: Two times a day (BID) | ORAL | 1 refills | Status: AC
Start: 2023-02-11 — End: ?

## 2023-02-11 NOTE — Therapy (Signed)
OUTPATIENT PHYSICAL THERAPY THORACOLUMBAR EVALUATION  THIS NOTE IS INCOMPLETE, PLEASE DO NOT REFERENCE FOR INFORMATION   Patient Name: Kaitlyn Mcdaniel MRN: [id number] DOB:10-26-1970, 52 y.o., female Today's Date: 02/13/2023  END OF SESSION:  PT End of Session - 02/13/23 2241     Visit Number 1    Number of Visits 17    Date for PT Re-Evaluation 04/09/23    Authorization Type eval: 02/12/23    PT Start Time 0800    PT Stop Time 0845    PT Time Calculation (min) 45 min    Activity Tolerance Patient tolerated treatment well    Behavior During Therapy WFL for tasks assessed/performed            Past Medical History:  Diagnosis Date   Anemia    Asthma    Back pain    Dependent edema    Dyspnea    Family history of kidney cancer    Family history of pancreatic cancer    Family history of uterine cancer    GERD (gastroesophageal reflux disease)    Joint pain    Osteoarthritis    S/P TKR (total knee replacement), bilateral    Sleep apnea    Vitamin B 12 deficiency    Past Surgical History:  Procedure Laterality Date   BREAST REDUCTION SURGERY  1999   CERVIX LESION DESTRUCTION  1998   abnormal PAP   REDUCTION MAMMAPLASTY Bilateral 1999   TOTAL KNEE ARTHROPLASTY Left 06/2016   TOTAL KNEE ARTHROPLASTY Right 09/2016   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   BMI 45.0-49.9, adult (HCC) 09/19/2021   Genetic testing 06/12/2021   Family history of uterine cancer 05/30/2021   Family history of pancreatic cancer 05/30/2021   Family history of kidney cancer 05/30/2021   B12 deficiency anemia 06/09/2019   Obesity 11/26/2018   Abnormal uterine bleeding 11/26/2018   Situational anxiety 03/27/2018   Chronic iron deficiency anemia 03/26/2018   Hyperlipidemia associated with type 2 diabetes  mellitus (HCC) 02/24/2018   Status post total knee replacement, 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   Status post total knee replacement, left 09/02/2016   Diabetes mellitus treated with injections of non-insulin medication (HCC) 09/05/2015   OSA (obstructive sleep apnea) 08/06/2015   Exercise-induced asthma 04/29/2015   GERD without esophagitis 04/29/2015   Hx of abnormal cervical Papanicolaou smear 04/29/2015   Degenerative arthritis of knee, bilateral 03/08/2015    PCP: Reubin Milan, MD  REFERRING PROVIDER: Jerrol Banana, MD  REFERRING DIAG: M53.3 (ICD-10-CM) - Sacroiliac joint pain M25.551 (ICD-10-CM) - Arthralgia of right hip  RATIONALE FOR EVALUATION AND TREATMENT: Rehabilitation  THERAPY DIAG: Other low back pain  Muscle weakness (generalized)  ONSET DATE: 2/30/24  FOLLOW-UP APPT SCHEDULED WITH REFERRING PROVIDER: Yes    SUBJECTIVE:  SUBJECTIVE STATEMENT:  R low back pain  PERTINENT HISTORY:  Assessment & Plan: Right SI joint pain during her pregnancy 21 years ago. Resolved without intervention. 3 months ago started with low back pain and R anterior groin pain, improved with walking. Anterior R groin pain during standing. 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 Will be driving to [place] 3d/wk. (3 hr one way). Improvement the day following the hip injection with gradual return of the pain since then.    Examination demonstrates focal tenderness at the right SI joint and at the right greater trochanter, throughout the gluteus medius/minimus, positive FADIR localizing to the right groin, positive Faber localizing to the groin and SI joint, negative straight leg raise, negative piriformis stretching, equivocal iliopsoas testing.   SI joint arthralgia with  secondary/compensatory right hip arthralgia, given chronicity of symptoms, osteoarthritis considered. BLE swelling with extended sitting.    - Obtain x-rays - Start meloxicam x 2 weeks scheduled - Close follow-up in 2 weeks to assess response, can further escalate intra-articular corticosteroid injection if needed - Start formal PT over interim   Orders: -     DG HIP UNILAT W OR W/O PELVIS 2-3 VIEWS RIGHT; Future -     DG Sacrum/Coccyx; Future -     Meloxicam; Take 1 tablet (15 mg total) by mouth daily.  Dispense: 30 tablet; Refill: 0 -     Ambulatory referral to Physical Therapy   Arthralgia of right hip Assessment & Plan: Noted over the past several weeks in the setting of chronic ipsilateral SI joint pain.  Focal to the groin without radiation, no paresthesias throughout the leg, aggravated with weightbearing, intermittent near buckling episodes, particularly after position change.  Denies any specific change in activities at onset.   - Obtain x-rays - Start meloxicam x 2 weeks scheduled - Close follow-up in 2 weeks to assess response, can further escalate intra-articular corticosteroid injection if needed - Start formal PT over interim   Orders: -     DG HIP UNILAT W OR W/O PELVIS 2-3 VIEWS RIGHT; Future -     DG Sacrum/Coccyx; Future -     Meloxicam; Take 1 tablet (15 mg total) by mouth daily.  Dispense: 30 tablet; Refill: 0 -     Ambulatory referral to Physical Therapy  Hip Pain    01/06/23: CLINICAL DATA:  Chronic right hip pain   EXAM: DG HIP (WITH OR WITHOUT PELVIS) 2-3V RIGHT   COMPARISON:  None   FINDINGS: No cause for hip pain identified. No significant degenerative change or loss of joint space. Pelvic bones are unremarkable.   IMPRESSION: Negative.  01/06/23: CLINICAL DATA:  Evaluate chronic right SI and groin pain.   EXAM: SACRUM AND COCCYX - 2+ VIEW   COMPARISON:  None Available.   FINDINGS: The SI joints are symmetric with no bony erosion. No  fractures are noted. No bony lesions. Mild degenerative disc disease in the lower lumbar spine. Possible mild lower lumbar facet degenerative changes.   IMPRESSION: 1. The SI joints are symmetric with no bony erosion. 2. Mild degenerative disc disease in the lower lumbar spine. Possible mild lower lumbar facet degenerative changes.   02/02/23:  CLINICAL DATA:  Low back pain.  Spondyloarthropathy suspected.   EXAM: MRI LUMBAR SPINE WITHOUT CONTRAST   TECHNIQUE: Multiplanar, multisequence MR imaging of the lumbar spine was performed. No intravenous contrast was administered.   COMPARISON:  None Available.   FINDINGS: Segmentation:  Standard.  Alignment:  Physiologic.   Vertebrae:  No fracture, evidence of discitis, or bone lesion.   Conus medullaris and cauda equina: Conus extends to the L2 level. Conus and cauda equina appear normal.   Paraspinal and other soft tissues: Negative for perispinal mass or inflammation   Disc levels:   L3-L4: Disc narrowing and bulging with central protrusion.   L4-L5: Disc height loss and mild endplate degeneration. Mild facet spurring asymmetric to the right. No neural compression   L5-S1:Greatest level of degenerative disc narrowing with fatty degenerative marrow conversion. Minor bilateral facet spurring. No neural compression.   IMPRESSION: 1. Lumbar spine degeneration most notable at L4-5 facets, especially on the right where there is marrow edema. 2. No neural compression.   01/27/23: Procedure:  Injection of right hip under ultrasound guidance. Ultrasound guidance utilized for in-plane approach, no effusion Samsung HS60 device utilized with permanent recording / reporting. Verbal informed consent obtained and verified. Skin prepped in a sterile fashion. Ethyl chloride for topical local analgesia.  Completed without difficulty and tolerated well. Medication: triamcinolone acetonide 40 mg/mL suspension for injection 1 mL  total and 2 mL lidocaine 1% without epinephrine utilized for needle placement anesthetic Advised to contact for fevers/chills, erythema, induration, drainage, or persistent bleeding.  History of BLE knee pain. 90# weight loss of 1.5 years intentional.   PAIN:    Pain Intensity: Present: 4/10, Best: 0/10, Worst: 8/10 Pain location: R low back and R anterior groin Pain Quality: R sharp, low back is aching; Radiating: No  Numbness/Tingling: No Focal Weakness: No Aggravating factors: sitting, laying on the R side (pain on outside of R hip);   Relieving factors: laying on L side, movement, diclofenac, heat better than ice, crossing RLE over LLE in sitting, if flared takes longer to get up stair 24-hour pain behavior: varies based on activity How long can you sit: 30 minutes How long can you stand: 20 minutes; History of prior back injury, pain, surgery, or therapy: Yes, R SIJ pain during pregnancy Dominant hand: right Imaging: Yes, see history; Red flags: Negative for bowel/bladder changes, saddle paresthesia, personal history of cancer, h/o spinal tumors, h/o compression fx, h/o abdominal aneurysm, abdominal pain, chills/fever, night sweats, nausea, vomiting, unrelenting pain, no abnormal bleeding;   PRECAUTIONS: None  WEIGHT BEARING RESTRICTIONS: No  FALLS: Has patient fallen in last 6 months? No  Living Environment Lives with: lives with their spouse  Prior level of function: Independent  Occupational demands: see history  Hobbies: two daughters (one in [place] and one in [place]), home improvement, new grandchild   Patient Goals: ride to [facility] without pain, sitting at computer without pain   OBJECTIVE:  Patient Surveys  Modified Oswestry    FOTO    Cognition Patient is oriented to person, place, and time.  Recent memory is intact.  Remote memory is intact.  Attention span and concentration are intact.  Expressive speech is intact.  Patient's fund of knowledge is within  normal limits for educational level.    Gross Musculoskeletal Assessment Tremor: None Bulk: Normal Tone: Normal No visible step-off along spinal column, no signs of scoliosis  GAIT: Full gait assessment deferred  Posture: Lumbar lordosis: WNL Iliac crest height: Equal bilaterally Lumbar lateral shift: Negative  AROM AROM (Normal range in degrees) AROM   Lumbar   Flexion (65) WNL  Extension (30) WNL  Right lateral flexion (25) Mod limitation* (R low back pain)  Left lateral flexion (25) WNL  Right rotation (30) WNL*  Left rotation (  30) WNL      Hip Right Left  Flexion (125) WNL WNL  Extension (15)    Abduction (40)    Adduction     Internal Rotation (45)    External Rotation (45)        Knee    Flexion (135)  WNL  Extension (0)  Hyperextension  (* = pain; Blank rows = not tested)  LE MMT: MMT (out of 5) Right  Left   Hip flexion 5 5* (R low back pain)  Hip extension 3+ 3+  Hip abduction 3+ 3+  Hip adduction 4+ 4+  Hip internal rotation 4* (stabbling lateral R hip pain) 5  Hip external rotation 5 5  Knee flexion    Knee extension 5 5  Ankle dorsiflexion    Ankle plantarflexion Strong Strong  (* = pain; Blank rows = not tested)  Sensation Grossly intact to light touch throughout bilateral LEs as determined by testing dermatomes L2-S2. Proprioception, stereognosis, and hot/cold testing deferred on this date.  Reflexes Deferred  Muscle Length Hamstrings: R: Negative, >90 degrees, low back pain as approaching 90 degrees L: Negative, >90 degrees [place] (quadriceps): R: Negative L: Negative Thomas (hip flexors): R: Not examined L: Not examined Ober: R: Negative L: Negative  Palpation Location Right Left         Lumbar paraspinals 2 0  Quadratus Lumborum 1 0  Gluteus Maximus 1 0  Gluteus Medius 3 1  Deep hip external rotators 3 1  PSIS 1 0  Fortin's Area (SIJ) 0 0  Greater Trochanter 2 1  (Blank rows = not tested) Graded on 0-4 scale (0 = no pain, 1 =  pain, 2 = pain with wincing/grimacing/flinching, 3 = pain with withdrawal, 4 = unwilling to allow palpation)  Passive Accessory Intervertebral Motion Pt denies reproduction of back pain with CPA L1-L5 and L UPA L1-L5. Positive reproduction of pain with R UPA L4-L5. Mobility grossly WNL  Special Tests Lumbar Radiculopathy and Discogenic: Centralization and Peripheralization (SN 92, -LR 0.12): Negative Slump (SN 83, -LR 0.32): R: Negative L: Negative SLR (SN 92, -LR 0.29): R: Negative L:  Negative Crossed SLR (SP 90): R: Negative L: Negative  Facet Joint: Extension-Rotation (SN 100, -LR 0.0): R: Negative L: Negative  Lumbar Foraminal Stenosis: Lumbar quadrant (SN 70): R: Negative L: Negative  Hip: FABER (SN 81): R: Negative L: Negative FADIR (SN 94): R: Positive for R low back pain L: Negative Hip scour (SN 50): R: Negative L: Negative  SIJ:  Thigh Thrust (SN 88, -LR 0.18) : R: Negative L: Not examined  Piriformis Syndrome: FAIR Test (SN 88, SP 83): R: Not examined L: Not examined  Functional Tasks Deferred  Beighton scale (flexible in house)  LEFT  RIGHT           1. Passive dorsiflexion and hyperextension of the fifth MCP joint beyond 90  1 1   2. Passive apposition of the thumb to the flexor aspect of the forearm  1 1  3. Passive hyperextension of the elbow beyond 10  1  1    4. Passive hyperextension of the knee beyond 10  1  0   5. Active forward flexion of the trunk with the knees fully extended so that the palms of the hands rest flat on the floor   0   TOTAL         7/ 9     TODAY'S TREATMENT: None   PATIENT EDUCATION:  Education details: Plan  of care Person educated: Patient Education method: Explanation Education comprehension: verbalized understanding   HOME EXERCISE PROGRAM:  None currently   ASSESSMENT:  CLINICAL IMPRESSION: Patient is a 52 y.o. female who was seen today for physical therapy evaluation and treatment for low back and R hip pain.    OBJECTIVE IMPAIRMENTS: decreased strength and pain.   ACTIVITY LIMITATIONS: sitting and sleeping  PARTICIPATION LIMITATIONS:   PERSONAL FACTORS: Past/current experiences, Time since onset of injury/illness/exacerbation, and 3+ comorbidities: OSA, DM, and obesity  are also affecting patient's functional outcome.   REHAB POTENTIAL: Good  CLINICAL DECISION MAKING: Stable/uncomplicated  EVALUATION COMPLEXITY: Low   GOALS: Goals reviewed with patient? No  SHORT TERM GOALS: Target date: 03/12/2023  Pt will be independent with HEP in order to improve strength and decrease back pain to improve pain-free function at home and work. Baseline:  Goal status: INITIAL   LONG TERM GOALS: Target date: 04/09/2023  Pt will increase FOTO to at least  to demonstrate significant improvement in function at home and work related to back pain  Baseline:  Goal status: INITIAL  2.  Pt will decrease worst back pain by at least 2 points on the NPRS in order to demonstrate clinically significant reduction in back pain. Baseline: 02/12/23: worst: 8/10; Goal status: INITIAL  3.  Pt will decrease mODI score by at least 13 points in order demonstrate clinically significant reduction in back pain/disability.       Baseline:  Goal status: INITIAL   PLAN: PT FREQUENCY: 1-2x/week  PT DURATION: 8 weeks  PLANNED INTERVENTIONS: Therapeutic exercises, Therapeutic activity, Neuromuscular re-education, Balance training, Gait training, Patient/Family education, Self Care, Joint mobilization, Joint manipulation, Vestibular training, Canalith repositioning, Orthotic/Fit training, DME instructions, Dry Needling, Electrical stimulation, Spinal manipulation, Spinal mobilization, Cryotherapy, Moist heat, Taping, Traction, Ultrasound, Ionotophoresis 4mg /ml Dexamethasone, Manual therapy, and Re-evaluation.  PLAN FOR NEXT SESSION:    Lynnea Maizes PT, DPT, GCS  Gailen Venne, PT 02/13/2023, 10:41 PM

## 2023-02-11 NOTE — Telephone Encounter (Signed)
fyi

## 2023-02-11 NOTE — Assessment & Plan Note (Addendum)
Blood sugars stable without hypoglycemic symptoms or events. Currently being treated with [facility]. Lab Results  Component Value Date   HGBA1C 5.1 09/12/2022  Doing well on 12.5 mg weekly with ongoing weight loss.

## 2023-02-11 NOTE — Progress Notes (Signed)
Date:  02/11/2023   Name:  Kaitlyn Mcdaniel   DOB:  02-04-1971   MRN:  [id number]   Chief Complaint: Diabetes  Diabetes She presents for her follow-up diabetic visit. She has type 2 diabetes mellitus. Pertinent negatives for hypoglycemia include no headaches, nervousness/anxiousness or tremors. Pertinent negatives for diabetes include no chest pain, no fatigue, no polydipsia and no polyuria.    Lab Results  Component Value Date   NA 138 05/12/2022   K 4.1 05/12/2022   CO2 24 05/12/2022   GLUCOSE 86 05/12/2022   BUN 20 05/12/2022   CREATININE 0.89 05/12/2022   CALCIUM 9.7 05/12/2022   EGFR 78 05/12/2022   GFRNONAA 68 04/11/2020   Lab Results  Component Value Date   CHOL 208 (H) 05/12/2022   HDL 55 05/12/2022   LDLCALC 137 (H) 05/12/2022   TRIG 89 05/12/2022   CHOLHDL 3.8 05/12/2022   Lab Results  Component Value Date   TSH 0.640 05/12/2022   Lab Results  Component Value Date   HGBA1C 5.1 09/12/2022   Lab Results  Component Value Date   WBC 7.2 05/12/2022   HGB 11.5 05/12/2022   HCT 36.5 05/12/2022   MCV 79 05/12/2022   PLT 249 05/12/2022   Lab Results  Component Value Date   ALT 6 05/12/2022   AST 12 05/12/2022   ALKPHOS 74 05/12/2022   BILITOT 0.3 05/12/2022   Lab Results  Component Value Date   VD25OH 39.7 04/11/2020     Review of Systems  Constitutional:  Negative for appetite change, fatigue, fever and unexpected weight change.  HENT:  Negative for tinnitus and trouble swallowing.   Eyes:  Negative for visual disturbance.  Respiratory:  Negative for cough, chest tightness and shortness of breath.   Cardiovascular:  Negative for chest pain, palpitations and leg swelling.  Gastrointestinal:  Negative for abdominal pain.  Endocrine: Negative for polydipsia and polyuria.  Genitourinary:  Negative for dysuria and hematuria.  Musculoskeletal:  Negative for arthralgias.  Neurological:  Negative for tremors, numbness and headaches.   Psychiatric/Behavioral:  Negative for dysphoric mood and sleep disturbance. The patient is not nervous/anxious.     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   BMI 45.0-49.9, adult (HCC) 09/19/2021   Genetic testing 06/12/2021   Family history of uterine cancer 05/30/2021   Family history of pancreatic cancer 05/30/2021   Family history of kidney cancer 05/30/2021   B12 deficiency anemia 06/09/2019   Obesity 11/26/2018   Abnormal uterine bleeding 11/26/2018   Situational anxiety 03/27/2018   Chronic iron deficiency anemia 03/26/2018   Hyperlipidemia associated with type 2 diabetes mellitus (HCC) 02/24/2018   Status post total knee replacement, 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   Status post total knee replacement, left 09/02/2016   Diabetes mellitus treated with injections of non-insulin medication (HCC) 09/05/2015   OSA (obstructive sleep apnea) 08/06/2015   Exercise-induced asthma 04/29/2015   GERD without esophagitis 04/29/2015   Hx of abnormal cervical Papanicolaou smear 04/29/2015   Degenerative arthritis of knee, bilateral 03/08/2015    No Known Allergies  Past Surgical History:  Procedure Laterality Date   BREAST REDUCTION SURGERY  1999   CERVIX LESION DESTRUCTION  1998   abnormal PAP   REDUCTION MAMMAPLASTY Bilateral 1999   TOTAL KNEE ARTHROPLASTY Left 06/2016   TOTAL KNEE ARTHROPLASTY Right 09/2016  Social History   Tobacco Use   Smoking status: Never   Smokeless tobacco: Never  Vaping Use   Vaping Use: Never used  Substance Use Topics   Alcohol use: Yes    Alcohol/week: 1.0 standard drink of alcohol    Types: 1 Glasses of wine per week   Drug use: No     Medication list has been reviewed and updated.  Current Meds  Medication Sig   acetaminophen (TYLENOL) 500 MG tablet Take 1,000  mg by mouth every 6 (six) hours as needed.   albuterol (VENTOLIN HFA) 108 (90 Base) MCG/ACT inhaler Inhale 2 puffs into the lungs every 6 (six) hours as needed for wheezing or shortness of breath.   cetirizine (ZYRTEC) 10 MG tablet Take 10 mg by mouth daily as needed for allergies.   diclofenac (VOLTAREN) 75 MG EC tablet Take 1 tablet (75 mg total) by mouth 2 (two) times daily.   ferrous gluconate (FERGON) 324 MG tablet Take 1 tablet (324 mg total) by mouth daily.   furosemide (LASIX) 40 MG tablet Take 40 mg by mouth 2 (two) times daily as needed.   mirabegron ER (MYRBETRIQ) 50 MG TB24 tablet Take 1 tablet (50 mg total) by mouth daily.   NON FORMULARY CPAP at night   Probiotic Product (PROBIOTIC-10 PO) Take 1 capsule by mouth daily as needed.   tirzepatide (MOUNJARO) 12.5 MG/0.5ML Pen Inject 12.5 mg into the skin once a week.   [DISCONTINUED] omeprazole (PRILOSEC) 20 MG capsule Take 1 capsule (20 mg total) by mouth in the morning and at bedtime.       09/12/2022   10:57 AM 05/12/2022   10:48 AM 10/08/2021   11:11 AM 09/19/2021    8:02 AM  GAD 7 : Generalized Anxiety Score  Nervous, Anxious, on Edge 0 0 0 0  Control/stop worrying 0 0 0 0  Worry too much - different things 0 0 0 0  Trouble relaxing 0 0 0 0  Restless 0 0 0 0  Easily annoyed or irritable 0 0 0 0  Afraid - awful might happen 0 0 0 0  Total GAD 7 Score 0 0 0 0  Anxiety Difficulty Not difficult at all Not difficult at all Not difficult at all        09/12/2022   10:57 AM 05/12/2022   10:48 AM 10/08/2021   11:11 AM  Depression screen PHQ 2/9  Decreased Interest 0 0 0  Down, Depressed, Hopeless 0 0 0  PHQ - 2 Score 0 0 0  Altered sleeping 2 2 0  Tired, decreased energy 1 1 0  Change in appetite 0 0 0  Feeling bad or failure about yourself  0 0 0  Trouble concentrating 0 0 0  Moving slowly or fidgety/restless 0 0 0  Suicidal thoughts 0 0 0  PHQ-9 Score 3 3 0  Difficult doing work/chores Not difficult at all Not  difficult at all Not difficult at all    BP Readings from Last 3 Encounters:  02/11/23 118/64  01/27/23 128/78  01/06/23 110/62    Physical Exam Vitals and nursing note reviewed.  Constitutional:      General: She is not in acute distress.    Appearance: She is well-developed.  HENT:     Head: Normocephalic and atraumatic.  Neck:     Vascular: No carotid bruit.  Cardiovascular:     Rate and Rhythm: Normal rate and regular rhythm.     Heart sounds: No  murmur heard. Pulmonary:     Effort: Pulmonary effort is normal. No respiratory distress.     Breath sounds: No wheezing or rhonchi.  Musculoskeletal:     Cervical back: Normal range of motion.     Right lower leg: No edema.     Left lower leg: No edema.  Lymphadenopathy:     Cervical: No cervical adenopathy.  Skin:    General: Skin is warm and dry.     Capillary Refill: Capillary refill takes less than 2 seconds.     Findings: No rash.  Neurological:     General: No focal deficit present.     Mental Status: She is alert and oriented to person, place, and time.  Psychiatric:        Mood and Affect: Mood normal.        Behavior: Behavior normal.     Wt Readings from Last 3 Encounters:  02/11/23 191 lb (86.6 kg)  01/27/23 199 lb (90.3 kg)  01/06/23 197 lb (89.4 kg)    BP 118/64   Pulse 63   Ht 5\' 3"  (1.6 m)   Wt 191 lb (86.6 kg)   SpO2 98%   BMI 33.83 kg/m   Assessment and Plan:  Problem List Items Addressed This Visit     GERD without esophagitis (Chronic)   Relevant Medications   omeprazole (PRILOSEC) 20 MG capsule   Diabetes mellitus treated with injections of non-insulin medication (HCC) - Primary    Blood sugars stable without hypoglycemic symptoms or events. Currently being treated with [facility]. Lab Results  Component Value Date   HGBA1C 5.1 09/12/2022  Doing well on 12.5 mg weekly with ongoing weight loss.       Relevant Orders   Basic metabolic panel   Hemoglobin A1c   Microalbumin /  creatinine urine ratio    No follow-ups on file.   Partially dictated using Dragon software, any errors are not intentional.  Reubin Milan, MD [facility] [place], [place]

## 2023-02-12 ENCOUNTER — Ambulatory Visit: Payer: BC Managed Care – PPO | Attending: Family Medicine

## 2023-02-12 DIAGNOSIS — M5459 Other low back pain: Secondary | ICD-10-CM

## 2023-02-12 DIAGNOSIS — M6281 Muscle weakness (generalized): Secondary | ICD-10-CM | POA: Diagnosis not present

## 2023-02-12 DIAGNOSIS — M25551 Pain in right hip: Secondary | ICD-10-CM | POA: Insufficient documentation

## 2023-02-12 DIAGNOSIS — M533 Sacrococcygeal disorders, not elsewhere classified: Secondary | ICD-10-CM | POA: Diagnosis not present

## 2023-02-13 LAB — HEMOGLOBIN A1C
Est. average glucose Bld gHb Est-mCnc: 103 mg/dL
Hgb A1c MFr Bld: 5.2 % (ref 4.8–5.6)

## 2023-02-13 LAB — BASIC METABOLIC PANEL
BUN/Creatinine Ratio: 14 (ref 9–23)
BUN: 13 mg/dL (ref 6–24)
CO2: 24 mmol/L (ref 20–29)
Calcium: 9.2 mg/dL (ref 8.7–10.2)
Chloride: 105 mmol/L (ref 96–106)
Creatinine, Ser: 0.92 mg/dL (ref 0.57–1.00)
Glucose: 86 mg/dL (ref 70–99)
Potassium: 4.1 mmol/L (ref 3.5–5.2)
Sodium: 142 mmol/L (ref 134–144)
eGFR: 74 mL/min/{1.73_m2} (ref >59)

## 2023-02-13 LAB — MICROALBUMIN / CREATININE URINE RATIO
Creatinine, Urine: 131 mg/dL
Microalb/Creat Ratio: 6 mg/g creat (ref 0–29)
Microalbumin, Urine: 8 ug/mL

## 2023-02-15 NOTE — Addendum Note (Signed)
Addended by: Ria Comment D on: 02/15/2023 03:32 PM   Modules accepted: Orders

## 2023-02-15 NOTE — Therapy (Signed)
OUTPATIENT PHYSICAL THERAPY THORACOLUMBAR TREATMENT  Patient Name: Kaitlyn Mcdaniel MRN: [id number] DOB:July 16, 1971, 52 y.o., female Today's Date: 02/17/2023  END OF SESSION:  PT End of Session - 02/17/23 0845     Visit Number 2    Number of Visits 17    Date for PT Re-Evaluation 04/09/23    Authorization Type eval: 02/12/23    PT Start Time 0845    PT Stop Time 0930    PT Time Calculation (min) 45 min    Activity Tolerance Patient tolerated treatment well    Behavior During Therapy WFL for tasks assessed/performed            Past Medical History:  Diagnosis Date   Anemia    Asthma    Back pain    Dependent edema    Dyspnea    Family history of kidney cancer    Family history of pancreatic cancer    Family history of uterine cancer    GERD (gastroesophageal reflux disease)    Joint pain    Osteoarthritis    S/P TKR (total knee replacement), bilateral    Sleep apnea    Vitamin B 12 deficiency    Past Surgical History:  Procedure Laterality Date   BREAST REDUCTION SURGERY  1999   CERVIX LESION DESTRUCTION  1998   abnormal PAP   REDUCTION MAMMAPLASTY Bilateral 1999   TOTAL KNEE ARTHROPLASTY Left 06/2016   TOTAL KNEE ARTHROPLASTY Right 09/2016   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   BMI 45.0-49.9, adult (HCC) 09/19/2021   Genetic testing 06/12/2021   Family history of uterine cancer 05/30/2021   Family history of pancreatic cancer 05/30/2021   Family history of kidney cancer 05/30/2021   B12 deficiency anemia 06/09/2019   Obesity 11/26/2018   Abnormal uterine bleeding 11/26/2018   Situational anxiety 03/27/2018   Chronic iron deficiency anemia 03/26/2018   Hyperlipidemia associated with type 2 diabetes mellitus (HCC) 02/24/2018   Status post total knee replacement, right  10/17/2016   Status post total knee replacement, left 09/02/2016   Diabetes mellitus treated with injections of non-insulin medication (HCC) 09/05/2015   OSA (obstructive sleep apnea) 08/06/2015   Exercise-induced asthma 04/29/2015   GERD without esophagitis 04/29/2015   Hx of abnormal cervical Papanicolaou smear 04/29/2015   Degenerative arthritis of knee, bilateral 03/08/2015    PCP: Reubin Milan, MD  REFERRING PROVIDER: Jerrol Banana, MD  REFERRING DIAG: M53.3 (ICD-10-CM) - Sacroiliac joint pain M25.551 (ICD-10-CM) - Arthralgia of right hip  RATIONALE FOR EVALUATION AND TREATMENT: Rehabilitation  THERAPY DIAG: Other low back pain  Muscle weakness (generalized)  ONSET DATE: 2/30/24  FOLLOW-UP APPT SCHEDULED WITH REFERRING PROVIDER: Yes  FROM INITIAL EVALUATION SUBJECTIVE:  SUBJECTIVE STATEMENT:  R low back pain  PERTINENT HISTORY:  Assessment & Plan: Pt referred for physical therapy by Dr. Ashley Royalty for R SIJ pain and R hip pain. Dr. Ashley Royalty evaluated pt, started her on meloxicam, and obtained plain films of her R sacrum/coccyx and R hip. She also had an MRI of her lumbar spine and underwent injection of her right hip with ultrasound guidance. Brief and mild initial improvement in R hip pain however the pain has been gradually worsening since then. The patient first experienced back pain when she had right SIJ pain during her pregnancy 21 years ago. The symptoms resolved without intervention however 3 months ago she started having insidious onset R low back and R anterior groin pain. No known trauma and no change in activity at pain onset. She experiences the R anterior groin pain particularly with extended standing. 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 (3 hr each way). She reports BLE swelling with extended sitting without an known etiology. She denies any history of CHF, DOE, or lymphedema. Pt reports history of BLE knee pain. She has also had a 90# intentional weight loss over the last 1.5 years.   01/06/23:  EXAM: DG HIP (WITH OR WITHOUT PELVIS) 2-3V RIGHT   COMPARISON:  None   FINDINGS: No cause for hip pain identified. No significant degenerative change or loss of joint space. Pelvic bones are unremarkable.   IMPRESSION: Negative.  01/06/23:  EXAM: SACRUM AND COCCYX - 2+ VIEW   COMPARISON:  None Available.   FINDINGS: The SI joints are symmetric with no bony erosion. No fractures are noted. No bony lesions. Mild degenerative disc disease in the lower lumbar spine. Possible mild lower lumbar facet degenerative changes.   IMPRESSION: 1. The SI joints are symmetric with no bony erosion. 2. Mild degenerative disc disease in the lower lumbar spine. Possible mild lower lumbar facet degenerative changes.  02/02/23:  EXAM: MRI LUMBAR SPINE WITHOUT CONTRAST   TECHNIQUE: Multiplanar, multisequence MR imaging of the lumbar spine was performed. No intravenous contrast was administered.   COMPARISON:  None Available.   FINDINGS: Segmentation:  Standard.   Alignment:  Physiologic.   Vertebrae:  No fracture, evidence of discitis, or bone lesion.   Conus medullaris and cauda equina: Conus extends to the L2 level. Conus and cauda equina appear normal.   Paraspinal and other soft tissues: Negative for perispinal mass or inflammation   Disc levels:   L3-L4: Disc narrowing and bulging with central protrusion.   L4-L5: Disc height loss and mild endplate degeneration. Mild facet spurring asymmetric to the right. No neural compression   L5-S1:Greatest level of degenerative disc narrowing with fatty degenerative marrow conversion. Minor bilateral facet spurring. No neural  compression.   IMPRESSION: 1. Lumbar spine degeneration most notable at L4-5 facets, especially on the right where there is marrow edema. 2. No neural compression.   01/27/23: Procedure:  Injection of right hip under ultrasound guidance. Ultrasound guidance utilized for in-plane approach, no effusion Samsung HS60 device utilized with permanent recording / reporting. Verbal informed consent obtained and verified. Skin prepped in a sterile fashion. Ethyl chloride for topical local analgesia.  Completed without difficulty and tolerated well. Medication: triamcinolone acetonide 40 mg/mL suspension for injection 1 mL total and 2 mL lidocaine 1% without epinephrine utilized for needle placement anesthetic Advised to contact for fevers/chills, erythema, induration, drainage, or persistent bleeding.  PAIN:  Pain Intensity: Present: 4/10, Best: 0/10, Worst: 8/10 Pain location: R low back and R anterior groin Pain Quality: R hip sharp, low back is aching; Radiating: No  Numbness/Tingling: No Focal Weakness: No Aggravating factors: sitting, laying on the R side (pain on outside of R hip);   Relieving factors: laying on L side, movement, diclofenac, heat better than ice, crossing RLE over LLE in sitting, if flared takes longer to get up stairs 24-hour pain behavior: varies based on activity How long can you sit: 30 minutes How long can you stand: 20 minutes; History of prior back injury, pain, surgery, or therapy: Yes, R SIJ pain during pregnancy but resolved without intervention. Dominant hand: right Imaging: Yes, see history; Red flags: Negative for bowel/bladder changes, saddle paresthesia, personal history of cancer, h/o spinal tumors, h/o compression fx, h/o abdominal aneurysm, abdominal pain, chills/fever, night sweats, nausea, vomiting, unrelenting pain, no abnormal bleeding;   PRECAUTIONS: None  WEIGHT BEARING RESTRICTIONS: No  FALLS: Has patient fallen in last 6 months?  No  Living Environment Lives with: lives with their spouse  Prior level of function: Independent  Occupational demands: see history  Hobbies: Two daughters (one lives in [place] and one in [place]), home improvement, new grandchild   Patient Goals: Pt would like to be able to drive to [facility] without pain and also be able to sit at her computer without an increase in pain   OBJECTIVE:  Patient Surveys  Modified Oswestry : To be completed  FOTO 65, predicted improvement to 110  Cognition Patient is oriented to person, place, and time.  Recent memory is intact.  Remote memory is intact.  Attention span and concentration are intact.  Expressive speech is intact.  Patient's fund of knowledge is within normal limits for educational level.    Gross Musculoskeletal Assessment Tremor: None Bulk: Normal Tone: Normal No visible step-off along spinal column, no signs of scoliosis  GAIT: Full gait assessment deferred  Posture: Lumbar lordosis: WNL Iliac crest height: Equal bilaterally Lumbar lateral shift: Negative  AROM AROM (Normal range in degrees) AROM   Lumbar   Flexion (65) WNL  Extension (30) WNL  Right lateral flexion (25) Mod limitation* (R low back pain)  Left lateral flexion (25) WNL  Right rotation (30) WNL*  Left rotation (30) WNL      Hip Right Left  Flexion (125) WNL WNL  Extension (15)    Abduction (40)    Adduction     Internal Rotation (45) >45 >45  External Rotation (45) >45 >45      Knee    Flexion (135) WNL WNL  Extension (0) Hyperextension Hyperextension  (* = pain; Blank rows = not tested)  LE MMT: MMT (out of 5) Right  Left   Hip flexion 5 5* (R low back pain)  Hip extension 3+ 3+  Hip abduction 3+ 3+  Hip adduction 4+ 4+  Hip internal rotation 4* (stabbling lateral R hip pain) 5  Hip external rotation 5 5  Knee flexion    Knee extension 5 5  Ankle dorsiflexion    Ankle plantarflexion Strong Strong  (* = pain; Blank rows = not  tested)  Sensation Grossly intact to light touch throughout bilateral LEs as determined by testing dermatomes L2-S2. Proprioception, stereognosis, and hot/cold testing deferred on this date.  Reflexes Deferred  Muscle Length Hamstrings: R: Negative, >90 degrees, low back pain as approaching 90 degrees L: Negative, >90 degrees Ely (quadriceps): R: Negative L: Negative Thomas (hip flexors):  R: Not examined L: Not examined Ober: R: Negative L: Negative  Palpation Location Right Left         Lumbar paraspinals 2 0  Quadratus Lumborum 1 0  Gluteus Maximus 1 0  Gluteus Medius 3 1  Deep hip external rotators 3 1  PSIS 1 0  Fortin's Area (SIJ) 0 0  Greater Trochanter 2 1  (Blank rows = not tested) Graded on 0-4 scale (0 = no pain, 1 = pain, 2 = pain with wincing/grimacing/flinching, 3 = pain with withdrawal, 4 = unwilling to allow palpation)  Passive Accessory Intervertebral Motion Pt denies reproduction of back pain with CPA L1-L5 and L UPA L1-L5. Positive reproduction of pain with R UPA L4-L5. Mobility grossly WNL throughout  Special Tests Lumbar Radiculopathy and Discogenic: Centralization and Peripheralization (SN 92, -LR 0.12): Negative Slump (SN 83, -LR 0.32): R: Negative L: Negative SLR (SN 92, -LR 0.29): R: Negative L:  Negative Crossed SLR (SP 90): R: Negative L: Negative  Facet Joint: Extension-Rotation (SN 100, -LR 0.0): R: Negative L: Negative  Lumbar Foraminal Stenosis: Lumbar quadrant (SN 70): R: Negative L: Negative  Hip: FABER (SN 81): R: Negative L: Negative FADIR (SN 94): R: Positive for R low back pain L: Negative Hip scour (SN 50): R: Negative L: Negative  SIJ:  Thigh Thrust (SN 88, -LR 0.18) : R: Negative L: Not examined  Piriformis Syndrome: FAIR Test (SN 88, SP 83): R: Not examined L: Not examined  Functional Tasks Deferred  Beighton scale (flexible in house)  LEFT  RIGHT           1. Passive dorsiflexion and hyperextension of the fifth MCP  joint beyond 90  1 1   2. Passive apposition of the thumb to the flexor aspect of the forearm  1 1  3. Passive hyperextension of the elbow beyond 10  1  1    4. Passive hyperextension of the knee beyond 10  1  0   5. Active forward flexion of the trunk with the knees fully extended so that the palms of the hands rest flat on the floor   0   TOTAL         7/ 9     TODAY'S TREATMENT:   SUBJECTIVE: Pt reports no significant changes since the initial evaluation. She arrives with 2/10 low back pain. She has her first trip to [facility] tomorrow but will not be driving so she will be able to adjust in her seat. No specific questions or concerns.     PAIN: 2/10;   Ther-ex  Pt completed mODI: 26% Prone alternating hip extension x 10 BLE; Hooklying ant/post pelvic tilt with 2-3s hold x 10 each; Hooklying lumbar rocking x 60s; Hooklying posterior pelvic tilt with alternating marching x 10 BLE; HEP issued and reviewed;   Manual Therapy  Prone R UPA L4-L5, grade I-II, 20s/bout x 2 bouts/level; Supine L hip long axis distraction with belt assist 3 x 30s; Supine L hip inferior mobilizations with hip flexed to 90 degrees, grade II-III, 30s/bout x 3 bouts; Supine L hip medial to lateral mobilizations with hip flexed to 45 degrees, grade II-III, 30s/bout x 3 bouts; Supine L hip inferior mobilizations with L hip in FABER position, grade II-III, 30s/bout x 3 bouts; L sidelying R lumbar gapping, grade III, 30s/bout x 3 bouts;   PATIENT EDUCATION:  Education details: Pt educated throughout session about proper posture and technique with exercises. Improved exercise technique, movement at target joints, use  of target muscles after min to mod verbal, visual, tactile cues. HEP. Person educated: Patient Education method: Explanation, Tactile cues, Verbal cues, and Handouts Education comprehension: verbalized understanding and returned demonstration   HOME EXERCISE PROGRAM:  Access Code: [email or web address] URL:  [email or web address] Date: 02/17/2023 Prepared by: Ria Comment  Exercises - Hooklying Lumbar Rotation  - 2 x daily - 7 x weekly - 2 sets - 10 reps - 3s hold - Supine Pelvic Tilt  - 2 x daily - 7 x weekly - 2 sets - 10 reps - 3s each direction hold - Supine March with Posterior Pelvic Tilt  - 2 x daily - 7 x weekly - 2 sets - 10 reps - 3s hold  Patient Education - Gluteus Medius Tendinopathy   ASSESSMENT:  CLINICAL IMPRESSION: Patient demonstrates excellent motivation during session today. Initiated manual techniques and lumbar stabilization program. HEP issued and reviewed with patient. Pt encouraged to follow-up as scheduled. Plan to progress stabilization program at future session. Pt will benefit from PT services to address deficits in strength and pain in order to return to full function at home.   OBJECTIVE IMPAIRMENTS: decreased strength and pain.   ACTIVITY LIMITATIONS: sitting and sleeping  PARTICIPATION LIMITATIONS:   PERSONAL FACTORS: Past/current experiences, Time since onset of injury/illness/exacerbation, and 3+ comorbidities: OSA, DM, and obesity  are also affecting patient's functional outcome.   REHAB POTENTIAL: Good  CLINICAL DECISION MAKING: Stable/uncomplicated  EVALUATION COMPLEXITY: Low   GOALS: Goals reviewed with patient? No  SHORT TERM GOALS: Target date: 03/12/2023  Pt will be independent with HEP in order to improve strength and decrease back pain to improve pain-free function at home and work. Baseline:  Goal status: INITIAL   LONG TERM GOALS: Target date: 04/09/2023  Pt will increase FOTO to at least 77  to demonstrate significant improvement in function at home and work related to back pain  Baseline: 02/12/23: 65 Goal status: INITIAL  2.  Pt will decrease worst back pain by at least 2 points on the NPRS in order to demonstrate clinically significant reduction in back pain. Baseline: 02/12/23: worst: 8/10; Goal status:  INITIAL  3.  Pt will decrease mODI score by at least 13 points in order demonstrate clinically significant reduction in back pain/disability.       Baseline: 02/12/23: To be completed; 02/17/23: 26% Goal status: INITIAL   PLAN: PT FREQUENCY: 1-2x/week  PT DURATION: 8 weeks  PLANNED INTERVENTIONS: Therapeutic exercises, Therapeutic activity, Neuromuscular re-education, Balance training, Gait training, Patient/Family education, Self Care, Joint mobilization, Joint manipulation, Vestibular training, Canalith repositioning, Orthotic/Fit training, DME instructions, Dry Needling, Electrical stimulation, Spinal manipulation, Spinal mobilization, Cryotherapy, Moist heat, Taping, Traction, Ultrasound, Ionotophoresis 4mg /ml Dexamethasone, Manual therapy, and Re-evaluation.  PLAN FOR NEXT SESSION: Progress manual techniques, progress stabilization program, review/modify HEP as necessary;   Sharalyn Ink Montrice Gracey PT, DPT, GCS  Derionna Salvador, PT 02/17/2023, 1:08 PM

## 2023-02-17 ENCOUNTER — Ambulatory Visit: Payer: BC Managed Care – PPO | Attending: Family Medicine

## 2023-02-17 ENCOUNTER — Telehealth: Payer: Self-pay | Admitting: Internal Medicine

## 2023-02-17 DIAGNOSIS — M6281 Muscle weakness (generalized): Secondary | ICD-10-CM | POA: Diagnosis not present

## 2023-02-17 DIAGNOSIS — M5459 Other low back pain: Secondary | ICD-10-CM | POA: Insufficient documentation

## 2023-02-17 NOTE — Telephone Encounter (Signed)
Pt called in says, already saw lab results in my chart and doesn't have any questions.

## 2023-02-18 ENCOUNTER — Encounter: Payer: Self-pay | Admitting: Internal Medicine

## 2023-02-19 ENCOUNTER — Ambulatory Visit: Payer: BC Managed Care – PPO

## 2023-02-27 ENCOUNTER — Ambulatory Visit: Payer: BC Managed Care – PPO

## 2023-02-27 DIAGNOSIS — M5459 Other low back pain: Secondary | ICD-10-CM | POA: Diagnosis not present

## 2023-02-27 DIAGNOSIS — M6281 Muscle weakness (generalized): Secondary | ICD-10-CM | POA: Diagnosis not present

## 2023-02-27 NOTE — Therapy (Signed)
OUTPATIENT PHYSICAL THERAPY THORACOLUMBAR TREATMENT  Patient Name: Kaitlyn Mcdaniel MRN: [id number] DOB:12/15/1970, 52 y.o., female Today's Date: 02/27/2023  END OF SESSION:  PT End of Session - 02/27/23 0804     Visit Number 3    Number of Visits 17    Date for PT Re-Evaluation 04/09/23    Authorization Type eval: 02/12/23    PT Start Time 0802    PT Stop Time 0853    PT Time Calculation (min) 51 min    Activity Tolerance Patient tolerated treatment well    Behavior During Therapy WFL for tasks assessed/performed            Past Medical History:  Diagnosis Date   Anemia    Asthma    Back pain    Dependent edema    Dyspnea    Family history of kidney cancer    Family history of pancreatic cancer    Family history of uterine cancer    GERD (gastroesophageal reflux disease)    Joint pain    Osteoarthritis    S/P TKR (total knee replacement), bilateral    Sleep apnea    Vitamin B 12 deficiency    Past Surgical History:  Procedure Laterality Date   BREAST REDUCTION SURGERY  1999   CERVIX LESION DESTRUCTION  1998   abnormal PAP   REDUCTION MAMMAPLASTY Bilateral 1999   TOTAL KNEE ARTHROPLASTY Left 06/2016   TOTAL KNEE ARTHROPLASTY Right 09/2016   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   BMI 45.0-49.9, adult (HCC) 09/19/2021   Genetic testing 06/12/2021   Family history of uterine cancer 05/30/2021   Family history of pancreatic cancer 05/30/2021   Family history of kidney cancer 05/30/2021   B12 deficiency anemia 06/09/2019   Obesity 11/26/2018   Abnormal uterine bleeding 11/26/2018   Situational anxiety 03/27/2018   Chronic iron deficiency anemia 03/26/2018   Hyperlipidemia associated with type 2 diabetes mellitus (HCC) 02/24/2018   Status post total knee replacement, right  10/17/2016   Status post total knee replacement, left 09/02/2016   Diabetes mellitus treated with injections of non-insulin medication (HCC) 09/05/2015   OSA (obstructive sleep apnea) 08/06/2015   Exercise-induced asthma 04/29/2015   GERD without esophagitis 04/29/2015   Hx of abnormal cervical Papanicolaou smear 04/29/2015   Degenerative arthritis of knee, bilateral 03/08/2015    PCP: Reubin Milan, MD  REFERRING PROVIDER: Jerrol Banana, MD  REFERRING DIAG: M53.3 (ICD-10-CM) - Sacroiliac joint pain M25.551 (ICD-10-CM) - Arthralgia of right hip  RATIONALE FOR EVALUATION AND TREATMENT: Rehabilitation  THERAPY DIAG: Other low back pain  Muscle weakness (generalized)  ONSET DATE: 2/30/24  FOLLOW-UP APPT SCHEDULED WITH REFERRING PROVIDER: Yes  FROM INITIAL EVALUATION SUBJECTIVE:  SUBJECTIVE STATEMENT:  R low back pain  PERTINENT HISTORY:  Assessment & Plan: Pt referred for physical therapy by Dr. Ashley Royalty for R SIJ pain and R hip pain. Dr. Ashley Royalty evaluated pt, started her on meloxicam, and obtained plain films of her R sacrum/coccyx and R hip. She also had an MRI of her lumbar spine and underwent injection of her right hip with ultrasound guidance. Brief and mild initial improvement in R hip pain however the pain has been gradually worsening since then. The patient first experienced back pain when she had right SIJ pain during her pregnancy 21 years ago. The symptoms resolved without intervention however 3 months ago she started having insidious onset R low back and R anterior groin pain. No known trauma and no change in activity at pain onset. She experiences the R anterior groin pain particularly with extended standing. 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 (3 hr each way). She reports BLE swelling with extended sitting without an known etiology. She denies any history of CHF, DOE, or lymphedema. Pt reports history of BLE knee pain. She has also had a 90# intentional weight loss over the last 1.5 years.   01/06/23:  EXAM: DG HIP (WITH OR WITHOUT PELVIS) 2-3V RIGHT   COMPARISON:  None   FINDINGS: No cause for hip pain identified. No significant degenerative change or loss of joint space. Pelvic bones are unremarkable.   IMPRESSION: Negative.  01/06/23:  EXAM: SACRUM AND COCCYX - 2+ VIEW   COMPARISON:  None Available.   FINDINGS: The SI joints are symmetric with no bony erosion. No fractures are noted. No bony lesions. Mild degenerative disc disease in the lower lumbar spine. Possible mild lower lumbar facet degenerative changes.   IMPRESSION: 1. The SI joints are symmetric with no bony erosion. 2. Mild degenerative disc disease in the lower lumbar spine. Possible mild lower lumbar facet degenerative changes.  02/02/23:  EXAM: MRI LUMBAR SPINE WITHOUT CONTRAST   TECHNIQUE: Multiplanar, multisequence MR imaging of the lumbar spine was performed. No intravenous contrast was administered.   COMPARISON:  None Available.   FINDINGS: Segmentation:  Standard.   Alignment:  Physiologic.   Vertebrae:  No fracture, evidence of discitis, or bone lesion.   Conus medullaris and cauda equina: Conus extends to the L2 level. Conus and cauda equina appear normal.   Paraspinal and other soft tissues: Negative for perispinal mass or inflammation   Disc levels:   L3-L4: Disc narrowing and bulging with central protrusion.   L4-L5: Disc height loss and mild endplate degeneration. Mild facet spurring asymmetric to the right. No neural compression   L5-S1:Greatest level of degenerative disc narrowing with fatty degenerative marrow conversion. Minor bilateral facet spurring. No neural  compression.   IMPRESSION: 1. Lumbar spine degeneration most notable at L4-5 facets, especially on the right where there is marrow edema. 2. No neural compression.   01/27/23: Procedure:  Injection of right hip under ultrasound guidance. Ultrasound guidance utilized for in-plane approach, no effusion Samsung HS60 device utilized with permanent recording / reporting. Verbal informed consent obtained and verified. Skin prepped in a sterile fashion. Ethyl chloride for topical local analgesia.  Completed without difficulty and tolerated well. Medication: triamcinolone acetonide 40 mg/mL suspension for injection 1 mL total and 2 mL lidocaine 1% without epinephrine utilized for needle placement anesthetic Advised to contact for fevers/chills, erythema, induration, drainage, or persistent bleeding.  PAIN:  Pain Intensity: Present: 4/10, Best: 0/10, Worst: 8/10 Pain location: R low back and R anterior groin Pain Quality: R hip sharp, low back is aching; Radiating: No  Numbness/Tingling: No Focal Weakness: No Aggravating factors: sitting, laying on the R side (pain on outside of R hip);   Relieving factors: laying on L side, movement, diclofenac, heat better than ice, crossing RLE over LLE in sitting, if flared takes longer to get up stairs 24-hour pain behavior: varies based on activity How long can you sit: 30 minutes How long can you stand: 20 minutes; History of prior back injury, pain, surgery, or therapy: Yes, R SIJ pain during pregnancy but resolved without intervention. Dominant hand: right Imaging: Yes, see history; Red flags: Negative for bowel/bladder changes, saddle paresthesia, personal history of cancer, h/o spinal tumors, h/o compression fx, h/o abdominal aneurysm, abdominal pain, chills/fever, night sweats, nausea, vomiting, unrelenting pain, no abnormal bleeding;   PRECAUTIONS: None  WEIGHT BEARING RESTRICTIONS: No  FALLS: Has patient fallen in last 6 months?  No  Living Environment Lives with: lives with their spouse  Prior level of function: Independent  Occupational demands: see history  Hobbies: Two daughters (one lives in [place] and one in [place]), home improvement, new grandchild   Patient Goals: Pt would like to be able to drive to [facility] without pain and also be able to sit at her computer without an increase in pain   OBJECTIVE:  Patient Surveys  Modified Oswestry : To be completed  FOTO 65, predicted improvement to 110  Cognition Patient is oriented to person, place, and time.  Recent memory is intact.  Remote memory is intact.  Attention span and concentration are intact.  Expressive speech is intact.  Patient's fund of knowledge is within normal limits for educational level.    Gross Musculoskeletal Assessment Tremor: None Bulk: Normal Tone: Normal No visible step-off along spinal column, no signs of scoliosis  GAIT: Full gait assessment deferred  Posture: Lumbar lordosis: WNL Iliac crest height: Equal bilaterally Lumbar lateral shift: Negative  AROM AROM (Normal range in degrees) AROM   Lumbar   Flexion (65) WNL  Extension (30) WNL  Right lateral flexion (25) Mod limitation* (R low back pain)  Left lateral flexion (25) WNL  Right rotation (30) WNL*  Left rotation (30) WNL      Hip Right Left  Flexion (125) WNL WNL  Extension (15)    Abduction (40)    Adduction     Internal Rotation (45) >45 >45  External Rotation (45) >45 >45      Knee    Flexion (135) WNL WNL  Extension (0) Hyperextension Hyperextension  (* = pain; Blank rows = not tested)  LE MMT: MMT (out of 5) Right  Left   Hip flexion 5 5* (R low back pain)  Hip extension 3+ 3+  Hip abduction 3+ 3+  Hip adduction 4+ 4+  Hip internal rotation 4* (stabbling lateral R hip pain) 5  Hip external rotation 5 5  Knee flexion    Knee extension 5 5  Ankle dorsiflexion    Ankle plantarflexion Strong Strong  (* = pain; Blank rows = not  tested)  Sensation Grossly intact to light touch throughout bilateral LEs as determined by testing dermatomes L2-S2. Proprioception, stereognosis, and hot/cold testing deferred on this date.  Reflexes Deferred  Muscle Length Hamstrings: R: Negative, >90 degrees, low back pain as approaching 90 degrees L: Negative, >90 degrees Ely (quadriceps): R: Negative L: Negative Thomas (hip flexors):  R: Not examined L: Not examined Ober: R: Negative L: Negative  Palpation Location Right Left         Lumbar paraspinals 2 0  Quadratus Lumborum 1 0  Gluteus Maximus 1 0  Gluteus Medius 3 1  Deep hip external rotators 3 1  PSIS 1 0  Fortin's Area (SIJ) 0 0  Greater Trochanter 2 1  (Blank rows = not tested) Graded on 0-4 scale (0 = no pain, 1 = pain, 2 = pain with wincing/grimacing/flinching, 3 = pain with withdrawal, 4 = unwilling to allow palpation)  Passive Accessory Intervertebral Motion Pt denies reproduction of back pain with CPA L1-L5 and L UPA L1-L5. Positive reproduction of pain with R UPA L4-L5. Mobility grossly WNL throughout  Special Tests Lumbar Radiculopathy and Discogenic: Centralization and Peripheralization (SN 92, -LR 0.12): Negative Slump (SN 83, -LR 0.32): R: Negative L: Negative SLR (SN 92, -LR 0.29): R: Negative L:  Negative Crossed SLR (SP 90): R: Negative L: Negative  Facet Joint: Extension-Rotation (SN 100, -LR 0.0): R: Negative L: Negative  Lumbar Foraminal Stenosis: Lumbar quadrant (SN 70): R: Negative L: Negative  Hip: FABER (SN 81): R: Negative L: Negative FADIR (SN 94): R: Positive for R low back pain L: Negative Hip scour (SN 50): R: Negative L: Negative  SIJ:  Thigh Thrust (SN 88, -LR 0.18) : R: Negative L: Not examined  Piriformis Syndrome: FAIR Test (SN 88, SP 83): R: Not examined L: Not examined  Functional Tasks Deferred  Beighton scale (flexible in house)  LEFT  RIGHT           1. Passive dorsiflexion and hyperextension of the fifth MCP  joint beyond 90  1 1   2. Passive apposition of the thumb to the flexor aspect of the forearm  1 1  3. Passive hyperextension of the elbow beyond 10  1  1    4. Passive hyperextension of the knee beyond 10  1  0   5. Active forward flexion of the trunk with the knees fully extended so that the palms of the hands rest flat on the floor   0   TOTAL         7/ 9     TODAY'S TREATMENT:   SUBJECTIVE: Pt reports she felt well for most of the week but got stuck in traffic yesterday and had to sit for an extended period of time. She was having some increased pain yesterday and today. No specific questions or concerns currently.    PAIN: R low back and R hip pain;   Trigger Point Dry Needling (TDN), unbilled Education performed with patient regarding potential benefit of TDN. Reviewed precautions and risks with patient. Pt provided verbal consent to treatment. In prone using clean technique TDN performed to R lumbar multifidi at L4 and L5 with 2, 0.30 x 60 single needle placements (one at each level). No adverse reactions observed or reported.   Ther-ex  NuStep L2 x 7 minutes for warm-up during interval history with moist heat pack to low back (3 minutes unbilled); Thomas Test negative bilaterally; Standing chair R hip flexor stretch 2 x 30s; Half kneeling R hip flexor stretch x 30s (added to HEP); Hooklying lumbar rocking x 60s; Hooklying posterior pelvic tilt with alternating marching x 10 BLE; HEP updated and reviewed;   Manual Therapy  Painful to palpation to bilateral illiacus, no significant difference between right and left; Prone IASTM with TrP release to R lumbar paraspinals; Prone CPA L2-L5,  grade I-II, 20s/bout x 2 bouts/level; Prone R UPA L3-L5, grade I-II, 20s/bout x 2 bouts/level; Supine R hip long axis distraction with belt assist 3 x 30s; Supine R hip inferior mobilizations with hip flexed to 90 degrees, grade II-III, 30s/bout x 2 bouts; Supine R hip medial to lateral  mobilizations with hip flexed to 45 degrees, grade II-III, 30s/bout x 3 bouts; Supine R hip inferior mobilizations with L hip in FABER position, grade II-III, 30s/bout x 3 bouts;   Not performed: L sidelying R lumbar gapping, grade III, 30s/bout x 3 bouts;   PATIENT EDUCATION:  Education details: Pt educated throughout session about proper posture and technique with exercises. Improved exercise technique, movement at target joints, use of target muscles after min to mod verbal, visual, tactile cues. HEP updates; Person educated: Patient Education method: Explanation, Tactile cues, Verbal cues, and Handouts Education comprehension: verbalized understanding and returned demonstration   HOME EXERCISE PROGRAM:  Access Code: [email or web address] URL: [email or web address] Date: 02/27/2023 Prepared by: Ria Comment  Exercises - Hooklying Lumbar Rotation  - 2 x daily - 7 x weekly - 2 sets - 10 reps - 3s hold - Supine Pelvic Tilt  - 2 x daily - 7 x weekly - 2 sets - 10 reps - 3s each direction hold - Supine March with Posterior Pelvic Tilt  - 2 x daily - 7 x weekly - 2 sets - 10 reps - 3s hold - Half Kneeling Hip Flexor Stretch  - 2 x daily - 7 x weekly - 3 reps - 30-45s hold  Patient Education - Gluteus Medius Tendinopathy   ASSESSMENT:  CLINICAL IMPRESSION: Patient demonstrates excellent motivation during session today. Initiated trigger point dry needling with 2 placements to R lumbar multifidi. Manual techniques progressed and continued with lumbar stabilization program. Negative Thomas Test bilaterally however with R hip flexor stretch pt reports some reproduction of R low back discomfort. Hip flexor stretch added to HEP. Pt encouraged to follow-up as scheduled. Plan to progress stabilization program at future session. Pt will benefit from PT services to address deficits in strength and pain in order to return to full function at home and work with less pain.   OBJECTIVE  IMPAIRMENTS: decreased strength and pain.   ACTIVITY LIMITATIONS: sitting and sleeping  PARTICIPATION LIMITATIONS:   PERSONAL FACTORS: Past/current experiences, Time since onset of injury/illness/exacerbation, and 3+ comorbidities: OSA, DM, and obesity  are also affecting patient's functional outcome.   REHAB POTENTIAL: Good  CLINICAL DECISION MAKING: Stable/uncomplicated  EVALUATION COMPLEXITY: Low   GOALS: Goals reviewed with patient? No  SHORT TERM GOALS: Target date: 03/12/2023  Pt will be independent with HEP in order to improve strength and decrease back pain to improve pain-free function at home and work. Baseline:  Goal status: INITIAL   LONG TERM GOALS: Target date: 04/09/2023  Pt will increase FOTO to at least 77  to demonstrate significant improvement in function at home and work related to back pain  Baseline: 02/12/23: 65 Goal status: INITIAL  2.  Pt will decrease worst back pain by at least 2 points on the NPRS in order to demonstrate clinically significant reduction in back pain. Baseline: 02/12/23: worst: 8/10; Goal status: INITIAL  3.  Pt will decrease mODI score by at least 13 points in order demonstrate clinically significant reduction in back pain/disability.       Baseline: 02/12/23: To be completed; 02/17/23: 26% Goal status: INITIAL   PLAN: PT FREQUENCY: 1-2x/week  PT DURATION: 8 weeks  PLANNED INTERVENTIONS:  Therapeutic exercises, Therapeutic activity, Neuromuscular re-education, Balance training, Gait training, Patient/Family education, Self Care, Joint mobilization, Joint manipulation, Vestibular training, Canalith repositioning, Orthotic/Fit training, DME instructions, Dry Needling, Electrical stimulation, Spinal manipulation, Spinal mobilization, Cryotherapy, Moist heat, Taping, Traction, Ultrasound, Ionotophoresis 4mg /ml Dexamethasone, Manual therapy, and Re-evaluation.  PLAN FOR NEXT SESSION: Progress manual techniques, progress stabilization  program, review/modify HEP as necessary;   Sharalyn Ink Korryn Pancoast PT, DPT, GCS  Kaitlyn Mcdaniel, PT 02/27/2023, 9:17 AM

## 2023-03-06 ENCOUNTER — Ambulatory Visit: Payer: BC Managed Care – PPO

## 2023-03-06 DIAGNOSIS — M6281 Muscle weakness (generalized): Secondary | ICD-10-CM | POA: Diagnosis not present

## 2023-03-06 DIAGNOSIS — M5459 Other low back pain: Secondary | ICD-10-CM | POA: Diagnosis not present

## 2023-03-06 NOTE — Therapy (Addendum)
OUTPATIENT PHYSICAL THERAPY THORACOLUMBAR TREATMENT  Patient Name: Kaitlyn Mcdaniel MRN: [id number] DOB:1971-03-13, 52 y.o., female Today's Date: 03/06/2023  END OF SESSION:  PT End of Session - 03/06/23 0932     Visit Number 4    Number of Visits 17    Date for PT Re-Evaluation 04/09/23    Authorization Type eval: 02/12/23    PT Start Time 0934    PT Stop Time 1015    PT Time Calculation (min) 41 min    Activity Tolerance Patient tolerated treatment well    Behavior During Therapy WFL for tasks assessed/performed            Past Medical History:  Diagnosis Date   Anemia    Asthma    Back pain    Dependent edema    Dyspnea    Family history of kidney cancer    Family history of pancreatic cancer    Family history of uterine cancer    GERD (gastroesophageal reflux disease)    Joint pain    Osteoarthritis    S/P TKR (total knee replacement), bilateral    Sleep apnea    Vitamin B 12 deficiency    Past Surgical History:  Procedure Laterality Date   BREAST REDUCTION SURGERY  1999   CERVIX LESION DESTRUCTION  1998   abnormal PAP   REDUCTION MAMMAPLASTY Bilateral 1999   TOTAL KNEE ARTHROPLASTY Left 06/2016   TOTAL KNEE ARTHROPLASTY Right 09/2016   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   BMI 45.0-49.9, adult (HCC) 09/19/2021   Genetic testing 06/12/2021   Family history of uterine cancer 05/30/2021   Family history of pancreatic cancer 05/30/2021   Family history of kidney cancer 05/30/2021   B12 deficiency anemia 06/09/2019   Obesity 11/26/2018   Abnormal uterine bleeding 11/26/2018   Situational anxiety 03/27/2018   Chronic iron deficiency anemia 03/26/2018   Hyperlipidemia associated with type 2 diabetes mellitus (HCC) 02/24/2018   Status post total knee replacement, right  10/17/2016   Status post total knee replacement, left 09/02/2016   Diabetes mellitus treated with injections of non-insulin medication (HCC) 09/05/2015   OSA (obstructive sleep apnea) 08/06/2015   Exercise-induced asthma 04/29/2015   GERD without esophagitis 04/29/2015   Hx of abnormal cervical Papanicolaou smear 04/29/2015   Degenerative arthritis of knee, bilateral 03/08/2015    PCP: Reubin Milan, MD  REFERRING PROVIDER: Jerrol Banana, MD  REFERRING DIAG: M53.3 (ICD-10-CM) - Sacroiliac joint pain M25.551 (ICD-10-CM) - Arthralgia of right hip  RATIONALE FOR EVALUATION AND TREATMENT: Rehabilitation  THERAPY DIAG: Other low back pain  Muscle weakness (generalized)  ONSET DATE: 2/30/24  FOLLOW-UP APPT SCHEDULED WITH REFERRING PROVIDER: Yes  FROM INITIAL EVALUATION SUBJECTIVE:  SUBJECTIVE STATEMENT:  R low back pain  PERTINENT HISTORY:  Assessment & Plan: Pt referred for physical therapy by Dr. Ashley Royalty for R SIJ pain and R hip pain. Dr. Ashley Royalty evaluated pt, started her on meloxicam, and obtained plain films of her R sacrum/coccyx and R hip. She also had an MRI of her lumbar spine and underwent injection of her right hip with ultrasound guidance. Brief and mild initial improvement in R hip pain however the pain has been gradually worsening since then. The patient first experienced back pain when she had right SIJ pain during her pregnancy 21 years ago. The symptoms resolved without intervention however 3 months ago she started having insidious onset R low back and R anterior groin pain. No known trauma and no change in activity at pain onset. She experiences the R anterior groin pain particularly with extended standing. 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 (3 hr each way). She reports BLE swelling with extended sitting without an known etiology. She denies any history of CHF, DOE, or lymphedema. Pt reports history of BLE knee pain. She has also had a 90# intentional weight loss over the last 1.5 years.   01/06/23:  EXAM: DG HIP (WITH OR WITHOUT PELVIS) 2-3V RIGHT   COMPARISON:  None   FINDINGS: No cause for hip pain identified. No significant degenerative change or loss of joint space. Pelvic bones are unremarkable.   IMPRESSION: Negative.  01/06/23:  EXAM: SACRUM AND COCCYX - 2+ VIEW   COMPARISON:  None Available.   FINDINGS: The SI joints are symmetric with no bony erosion. No fractures are noted. No bony lesions. Mild degenerative disc disease in the lower lumbar spine. Possible mild lower lumbar facet degenerative changes.   IMPRESSION: 1. The SI joints are symmetric with no bony erosion. 2. Mild degenerative disc disease in the lower lumbar spine. Possible mild lower lumbar facet degenerative changes.  02/02/23:  EXAM: MRI LUMBAR SPINE WITHOUT CONTRAST   TECHNIQUE: Multiplanar, multisequence MR imaging of the lumbar spine was performed. No intravenous contrast was administered.   COMPARISON:  None Available.   FINDINGS: Segmentation:  Standard.   Alignment:  Physiologic.   Vertebrae:  No fracture, evidence of discitis, or bone lesion.   Conus medullaris and cauda equina: Conus extends to the L2 level. Conus and cauda equina appear normal.   Paraspinal and other soft tissues: Negative for perispinal mass or inflammation   Disc levels:   L3-L4: Disc narrowing and bulging with central protrusion.   L4-L5: Disc height loss and mild endplate degeneration. Mild facet spurring asymmetric to the right. No neural compression   L5-S1:Greatest level of degenerative disc narrowing with fatty degenerative marrow conversion. Minor bilateral facet spurring. No neural  compression.   IMPRESSION: 1. Lumbar spine degeneration most notable at L4-5 facets, especially on the right where there is marrow edema. 2. No neural compression.   01/27/23: Procedure:  Injection of right hip under ultrasound guidance. Ultrasound guidance utilized for in-plane approach, no effusion Samsung HS60 device utilized with permanent recording / reporting. Verbal informed consent obtained and verified. Skin prepped in a sterile fashion. Ethyl chloride for topical local analgesia.  Completed without difficulty and tolerated well. Medication: triamcinolone acetonide 40 mg/mL suspension for injection 1 mL total and 2 mL lidocaine 1% without epinephrine utilized for needle placement anesthetic Advised to contact for fevers/chills, erythema, induration, drainage, or persistent bleeding.  PAIN:  Pain Intensity: Present: 4/10, Best: 0/10, Worst: 8/10 Pain location: R low back and R anterior groin Pain Quality: R hip sharp, low back is aching; Radiating: No  Numbness/Tingling: No Focal Weakness: No Aggravating factors: sitting, laying on the R side (pain on outside of R hip);   Relieving factors: laying on L side, movement, diclofenac, heat better than ice, crossing RLE over LLE in sitting, if flared takes longer to get up stairs 24-hour pain behavior: varies based on activity How long can you sit: 30 minutes How long can you stand: 20 minutes; History of prior back injury, pain, surgery, or therapy: Yes, R SIJ pain during pregnancy but resolved without intervention. Dominant hand: right Imaging: Yes, see history; Red flags: Negative for bowel/bladder changes, saddle paresthesia, personal history of cancer, h/o spinal tumors, h/o compression fx, h/o abdominal aneurysm, abdominal pain, chills/fever, night sweats, nausea, vomiting, unrelenting pain, no abnormal bleeding;   PRECAUTIONS: None  WEIGHT BEARING RESTRICTIONS: No  FALLS: Has patient fallen in last 6 months?  No  Living Environment Lives with: lives with their spouse  Prior level of function: Independent  Occupational demands: see history  Hobbies: Two daughters (one lives in [place] and one in [place]), home improvement, new grandchild   Patient Goals: Pt would like to be able to drive to [facility] without pain and also be able to sit at her computer without an increase in pain   OBJECTIVE:  Patient Surveys  Modified Oswestry : To be completed  FOTO 65, predicted improvement to 110  Cognition Patient is oriented to person, place, and time.  Recent memory is intact.  Remote memory is intact.  Attention span and concentration are intact.  Expressive speech is intact.  Patient's fund of knowledge is within normal limits for educational level.    Gross Musculoskeletal Assessment Tremor: None Bulk: Normal Tone: Normal No visible step-off along spinal column, no signs of scoliosis  GAIT: Full gait assessment deferred  Posture: Lumbar lordosis: WNL Iliac crest height: Equal bilaterally Lumbar lateral shift: Negative  AROM AROM (Normal range in degrees) AROM   Lumbar   Flexion (65) WNL  Extension (30) WNL  Right lateral flexion (25) Mod limitation* (R low back pain)  Left lateral flexion (25) WNL  Right rotation (30) WNL*  Left rotation (30) WNL      Hip Right Left  Flexion (125) WNL WNL  Extension (15)    Abduction (40)    Adduction     Internal Rotation (45) >45 >45  External Rotation (45) >45 >45      Knee    Flexion (135) WNL WNL  Extension (0) Hyperextension Hyperextension  (* = pain; Blank rows = not tested)  LE MMT: MMT (out of 5) Right  Left   Hip flexion 5 5* (R low back pain)  Hip extension 3+ 3+  Hip abduction 3+ 3+  Hip adduction 4+ 4+  Hip internal rotation 4* (stabbling lateral R hip pain) 5  Hip external rotation 5 5  Knee flexion    Knee extension 5 5  Ankle dorsiflexion    Ankle plantarflexion Strong Strong  (* = pain; Blank rows = not  tested)  Sensation Grossly intact to light touch throughout bilateral LEs as determined by testing dermatomes L2-S2. Proprioception, stereognosis, and hot/cold testing deferred on this date.  Reflexes Deferred  Muscle Length Hamstrings: R: Negative, >90 degrees, low back pain as approaching 90 degrees L: Negative, >90 degrees E[place] (quadriceps): R: Negative L: Negative Thomas (hip flexors):  R: Negative L: Negative Ober: R: Negative L: Negative  Palpation Location Right Left         Lumbar paraspinals 2 0  Quadratus Lumborum 1 0  Gluteus Maximus 1 0  Gluteus Medius 3 1  Deep hip external rotators 3 1  PSIS 1 0  Fortin's Area (SIJ) 0 0  Greater Trochanter 2 1  (Blank rows = not tested) Graded on 0-4 scale (0 = no pain, 1 = pain, 2 = pain with wincing/grimacing/flinching, 3 = pain with withdrawal, 4 = unwilling to allow palpation)  Passive Accessory Intervertebral Motion Pt denies reproduction of back pain with CPA L1-L5 and L UPA L1-L5. Positive reproduction of pain with R UPA L4-L5. Mobility grossly WNL throughout  Special Tests Lumbar Radiculopathy and Discogenic: Centralization and Peripheralization (SN 92, -LR 0.12): Negative Slump (SN 83, -LR 0.32): R: Negative L: Negative SLR (SN 92, -LR 0.29): R: Negative L:  Negative Crossed SLR (SP 90): R: Negative L: Negative  Facet Joint: Extension-Rotation (SN 100, -LR 0.0): R: Negative L: Negative  Lumbar Foraminal Stenosis: Lumbar quadrant (SN 70): R: Negative L: Negative  Hip: FABER (SN 81): R: Negative L: Negative FADIR (SN 94): R: Positive for R low back pain L: Negative Hip scour (SN 50): R: Negative L: Negative  SIJ:  Thigh Thrust (SN 88, -LR 0.18) : R: Negative L: Not examined  Piriformis Syndrome: FAIR Test (SN 88, SP 83): R: Not examined L: Not examined  Functional Tasks Deferred  Beighton scale (flexible in house)  LEFT  RIGHT           1. Passive dorsiflexion and hyperextension of the fifth MCP joint  beyond 90  1 1   2. Passive apposition of the thumb to the flexor aspect of the forearm  1 1  3. Passive hyperextension of the elbow beyond 10  1  1    4. Passive hyperextension of the knee beyond 10  1  0   5. Active forward flexion of the trunk with the knees fully extended so that the palms of the hands rest flat on the floor   0   TOTAL         7/ 9     TODAY'S TREATMENT:   SUBJECTIVE: Pt reports she felt alright after the last therapy session with no excessive soreness from the dry needling. She is always more sore after driving back from [place]. No specific questions or concerns currently.    PAIN: R low back and R hip pain;   Trigger Point Dry Needling (TDN), unbilled Education previoulsy performed with patient regarding potential benefit of TDN. Previously reviewed precautions and risks with patient. Pt provided verbal consent to treatment. In prone using clean technique TDN performed to bilateral lumbar multifidi at L4 and L5 with 4, 0.30 x 75 single needle placements (one at each level on each side). No adverse reactions observed or reported.   Ther-ex  NuStep L1-2 x 10 minutes for warm-up during interval history with moist heat pack to low back (5 minutes unbilled); Quadruped alternating kick backs x 10 BLE; Quadruped cat camels; Quadruped bird dog x 10 bilaterally; Hooklying alternating marching with transverse abdominis contraction  x 10 BLE; Attempted hooklying bicycle but discontinued due to R low back pain with first rep; Supine low SLR with transverse abdominis contraction x 10 BLE;   Manual Therapy  Prone STM with effleurage to bilateral lumbar paraspinals; Prone CPA L2-L5, grade I-II, 20s/bout x 2 bouts/level;   Not  performed: L sidelying R lumbar gapping, grade III, 30s/bout x 3 bouts; Supine R hip long axis distraction with belt assist 3 x 30s; Supine R hip inferior mobilizations with hip flexed to 90 degrees, grade II-III, 30s/bout x 2  bouts; Supine R hip medial to lateral mobilizations with hip flexed to 45 degrees, grade II-III, 30s/bout x 3 bouts; Supine R hip inferior mobilizations with L hip in FABER position, grade II-III, 30s/bout x 3 bouts;   PATIENT EDUCATION:  Education details: Pt educated throughout session about proper posture and technique with exercises. Improved exercise technique, movement at target joints, use of target muscles after min to mod verbal, visual, tactile cues.  Person educated: Patient Education method: Explanation, Tactile cues, Verbal cues, and Handouts Education comprehension: verbalized understanding and returned demonstration   HOME EXERCISE PROGRAM:  Access Code: [email or web address] URL: [email or web address] Date: 03/06/2023 Prepared by: Ria Comment  Exercises - Hooklying Lumbar Rotation  - 2 x daily - 7 x weekly - 2 sets - 10 reps - 3s hold - Supine Pelvic Tilt  - 2 x daily - 7 x weekly - 2 sets - 10 reps - 3s each direction hold - Supine March with Posterior Pelvic Tilt  - 2 x daily - 7 x weekly - 2 sets - 10 reps - 3s hold - Supine Bridge  - 2 x daily - 7 x weekly - 2 sets - 10 reps - 3s hold - Half Kneeling Hip Flexor Stretch  - 2 x daily - 7 x weekly - 3 reps - 30-45s hold  Patient Education - Gluteus Medius Tendinopathy   ASSESSMENT:  CLINICAL IMPRESSION: Patient demonstrates excellent motivation during session today. Progressed trigger point dry needling with 2 placements to R lumbar multifidi and included an additional 2 placements in L lumbar multifidi. Manual techniques progressed and continued with lumbar stabilization program. Bridges added to HEP. Pt encouraged to follow-up as scheduled. Plan to progress stabilization program at future session. Pt will benefit from PT services to address deficits in strength and pain in order to return to full function at home and work with less pain.   OBJECTIVE IMPAIRMENTS: decreased strength and pain.   ACTIVITY  LIMITATIONS: sitting and sleeping  PARTICIPATION LIMITATIONS:   PERSONAL FACTORS: Past/current experiences, Time since onset of injury/illness/exacerbation, and 3+ comorbidities: OSA, DM, and obesity  are also affecting patient's functional outcome.   REHAB POTENTIAL: Good  CLINICAL DECISION MAKING: Stable/uncomplicated  EVALUATION COMPLEXITY: Low   GOALS: Goals reviewed with patient? No  SHORT TERM GOALS: Target date: 03/12/2023  Pt will be independent with HEP in order to improve strength and decrease back pain to improve pain-free function at home and work. Baseline:  Goal status: INITIAL   LONG TERM GOALS: Target date: 04/09/2023  Pt will increase FOTO to at least 77  to demonstrate significant improvement in function at home and work related to back pain  Baseline: 02/12/23: 65 Goal status: INITIAL  2.  Pt will decrease worst back pain by at least 2 points on the NPRS in order to demonstrate clinically significant reduction in back pain. Baseline: 02/12/23: worst: 8/10; Goal status: INITIAL  3.  Pt will decrease mODI score by at least 13 points in order demonstrate clinically significant reduction in back pain/disability.       Baseline: 02/12/23: To be completed; 02/17/23: 26% Goal status: INITIAL   PLAN: PT FREQUENCY: 1-2x/week  PT DURATION: 8 weeks  PLANNED INTERVENTIONS: Therapeutic exercises, Therapeutic activity, Neuromuscular re-education, Balance training, Gait training,  Patient/Family education, Self Care, Joint mobilization, Joint manipulation, Vestibular training, Canalith repositioning, Orthotic/Fit training, DME instructions, Dry Needling, Electrical stimulation, Spinal manipulation, Spinal mobilization, Cryotherapy, Moist heat, Taping, Traction, Ultrasound, Ionotophoresis 4mg /ml Dexamethasone, Manual therapy, and Re-evaluation.  PLAN FOR NEXT SESSION: Progress manual techniques, progress stabilization program, review/modify HEP as necessary;   Sharalyn Ink  Thomasa Heidler PT, DPT, GCS  Johnnye Sandford, PT 03/06/2023, 12:27 PM

## 2023-03-09 ENCOUNTER — Ambulatory Visit: Payer: BC Managed Care – PPO | Admitting: Physical Therapy

## 2023-03-10 ENCOUNTER — Ambulatory Visit
Payer: BC Managed Care – PPO | Attending: Student in an Organized Health Care Education/Training Program | Admitting: Student in an Organized Health Care Education/Training Program

## 2023-03-10 ENCOUNTER — Encounter: Payer: Self-pay | Admitting: Student in an Organized Health Care Education/Training Program

## 2023-03-10 VITALS — BP 123/60 | HR 63 | Temp 97.4°F | Ht 63.0 in | Wt 195.0 lb

## 2023-03-10 DIAGNOSIS — M5136 Other intervertebral disc degeneration, lumbar region: Secondary | ICD-10-CM | POA: Diagnosis not present

## 2023-03-10 DIAGNOSIS — M47816 Spondylosis without myelopathy or radiculopathy, lumbar region: Secondary | ICD-10-CM | POA: Insufficient documentation

## 2023-03-10 NOTE — Progress Notes (Signed)
Safety precautions to be maintained throughout the outpatient stay will include: orient to surroundings, keep bed in low position, maintain call bell within reach at all times, provide assistance with transfer out of bed and ambulation.  

## 2023-03-10 NOTE — Progress Notes (Signed)
Patient: Kaitlyn Mcdaniel  Service Category: E/M  Provider: Edward Jolly, MD  DOB: 1970-10-03  DOS: 03/10/2023  Referring Provider: Jerrol Banana, MD  MRN: [id number]  Setting: Ambulatory outpatient  PCP: Reubin Milan, MD  Type: New Patient  Specialty: Interventional Pain Management    Location: Office  Delivery: Face-to-face     Primary Reason(s) for Visit: Encounter for initial evaluation of one or more chronic problems (new to examiner) potentially causing chronic pain, and posing a threat to normal musculoskeletal function. (Level of risk: High) CC: Back Pain (Lower; right worse)  HPI  Kaitlyn Mcdaniel is a 52 y.o. year old, female patient, who comes for the first time to our practice referred by Ashley Royalty Ocie Bob, MD for our initial evaluation of her chronic pain. She has Exercise-induced asthma; Degenerative arthritis of knee, bilateral; GERD without esophagitis; Hx of abnormal cervical Papanicolaou smear; OSA (obstructive sleep apnea); Diabetes mellitus treated with injections of non-insulin medication (HCC); Status post total knee replacement, left; Status post total knee replacement, right; Hyperlipidemia associated with type 2 diabetes mellitus (HCC); Chronic iron deficiency anemia; Situational anxiety; Obesity; Abnormal uterine bleeding; B12 deficiency anemia; Family history of uterine cancer; Family history of pancreatic cancer; Family history of kidney cancer; Genetic testing; BMI 45.0-49.9, adult (HCC); Sacroiliac joint pain; Femoroacetabular impingement of right hip; and Spondylosis of lumbosacral region without myelopathy or radiculopathy on their problem list. Today she comes in for evaluation of her Back Pain (Lower; right worse)  Pain Assessment: Location: Right, Upper, Mid Back Radiating: denies Onset: More than a month ago Duration: Chronic pain Quality: Constant, Aching, Stabbing Severity: 4 /10 (subjective, self-reported pain score)  Effect on ADL: limits ADLS Timing:  Constant Modifying factors: meds, PT BP: 123/60  HR: 63  Onset and Duration: Gradual and Present longer than 3 months Cause of pain: Unknown Severity: No change since onset, NAS-11 at its worse: 7/10, NAS-11 at its best: 2/10, NAS-11 now: 5/10, and NAS-11 on the average: 4/10 Timing: Not influenced by the time of the day, During activity or exercise, After activity or exercise, and After a period of immobility Aggravating Factors: Bending, Lifiting, Motion, Prolonged sitting, and Stooping  Alleviating Factors: Stretching, Lying down, Medications, and Walking Associated Problems: pt did not state any Quality of Pain: Aching, Intermittent, Cramping, Nagging, Stabbing, Tender, and Uncomfortable Previous Examinations or Tests: Epidurogram and Orthopedic evaluation Previous Treatments: Physical Therapy, Strengthening exercises, Stretching exercises, and Trigger point injections  Kaitlyn Mcdaniel is being evaluated for possible interventional pain management therapies for the treatment of her chronic pain.   Kaitlyn Mcdaniel is a pleasant 52 year old female who presents with a chief complaint of axial low back pain overlying her right L4-L5 region.  This has been going on for over 3 months.  No inciting or traumatic event.  She has been participating in physical therapy.  She has had a right ultrasound-guided hip injection which was helpful.  She has tried various anti-inflammatories including diclofenac, ibuprofen, acetaminophen.  Her lumbar MRI below shows lumbar facet arthropathy and facet changes on the right at L4-L5 and L5-S1.  She is being referred here from Dr. Ashley Royalty to consider spinal injections.  Meds   Current Outpatient Medications:    acetaminophen (TYLENOL) 500 MG tablet, Take 1,000 mg by mouth every 6 (six) hours as needed., Disp: , Rfl:    albuterol (VENTOLIN HFA) 108 (90 Base) MCG/ACT inhaler, Inhale 2 puffs into the lungs every 6 (six) hours as needed for wheezing or shortness of breath.,  Disp: 54 g, Rfl: 1   cetirizine (ZYRTEC) 10 MG tablet, Take 10 mg by mouth daily as needed for allergies., Disp: , Rfl:    diclofenac (VOLTAREN) 75 MG EC tablet, Take 1 tablet (75 mg total) by mouth 2 (two) times daily., Disp: 30 tablet, Rfl: 0   ferrous gluconate (FERGON) 324 MG tablet, Take 1 tablet (324 mg total) by mouth daily., Disp: 100 tablet, Rfl: 0   furosemide (LASIX) 40 MG tablet, Take 40 mg by mouth 2 (two) times daily as needed., Disp: , Rfl:    mirabegron ER (MYRBETRIQ) 50 MG TB24 tablet, Take 1 tablet (50 mg total) by mouth daily., Disp: 30 tablet, Rfl: 11   omeprazole (PRILOSEC) 20 MG capsule, Take 1 capsule (20 mg total) by mouth in the morning and at bedtime., Disp: 180 capsule, Rfl: 1   Probiotic Product (PROBIOTIC-10 PO), Take 1 capsule by mouth daily as needed., Disp: , Rfl:    tirzepatide (MOUNJARO) 12.5 MG/0.5ML Pen, Inject 12.5 mg into the skin once a week., Disp: 6 mL, Rfl: 0   NON FORMULARY, CPAP at night, Disp: , Rfl:   Imaging Review   MR Lumbar Spine Wo Contrast  Narrative CLINICAL DATA:  Low back pain.  Spondyloarthropathy suspected.  EXAM: MRI LUMBAR SPINE WITHOUT CONTRAST  TECHNIQUE: Multiplanar, multisequence MR imaging of the lumbar spine was performed. No intravenous contrast was administered.  COMPARISON:  None Available.  FINDINGS: Segmentation:  Standard.  Alignment:  Physiologic.  Vertebrae:  No fracture, evidence of discitis, or bone lesion.  Conus medullaris and cauda equina: Conus extends to the L2 level. Conus and cauda equina appear normal.  Paraspinal and other soft tissues: Negative for perispinal mass or inflammation  Disc levels:  L3-L4: Disc narrowing and bulging with central protrusion.  L4-L5: Disc height loss and mild endplate degeneration. Mild facet spurring asymmetric to the right. No neural compression  L5-S1:Greatest level of degenerative disc narrowing with fatty degenerative marrow conversion. Minor bilateral  facet spurring. No neural compression.  IMPRESSION: 1. Lumbar spine degeneration most notable at L4-5 facets, especially on the right where there is marrow edema. 2. No neural compression.   Electronically Signed By: Tiburcio Pea M.D. On: 02/10/2023 07:35  DG Hip Unilat W OR W/O Pelvis 2-3 Views Right  Narrative CLINICAL DATA:  Chronic right hip pain  EXAM: DG HIP (WITH OR WITHOUT PELVIS) 2-3V RIGHT  COMPARISON:  None  FINDINGS: No cause for hip pain identified. No significant degenerative change or loss of joint space. Pelvic bones are unremarkable.  IMPRESSION: Negative.   Electronically Signed By: Gerome Sam III M.D. On: 01/10/2023 10:12   Narrative CLINICAL DATA:  Bilateral knee pain with osteoarthritis. Progressively getting worse.  EXAM: MRI OF THE LEFT KNEE WITHOUT CONTRAST  TECHNIQUE: Multiplanar, multisequence MR imaging of the knee was performed. No intravenous contrast was administered.  COMPARISON:  None.  FINDINGS: MENISCI  Medial meniscus: Severe attenuation of the posterior horn of the medial meniscus consistent with a tear.  Lateral meniscus:  Intact.  LIGAMENTS  Cruciates:  Intact ACL and PCL.  Collaterals: Medial collateral ligament is intact. Lateral collateral ligament complex is intact.  CARTILAGE  Patellofemoral: Fissuring with high-grade partial-thickness cartilage loss of the patellar apex. Mild fissuring in the lateral patellar facet. Partial-thickness cartilage loss of the medial trochlea.  Medial: High-grade partial-thickness cartilage loss of the medial femoral condyle and medial tibial plateau with areas of full-thickness cartilage loss.  Lateral: Cartilage irregularity with mild partial thickness cartilage  loss of the lateral femoral condyle and lateral tibial plateau.  Joint: Moderate joint effusion. Mildly thickened suprapatellar plica. Normal Hoffa's fat.  Popliteal Fossa:  No Baker cyst.   Intact popliteus tendon.  Extensor Mechanism:  Intact.  Bones: Tricompartmental marginal osteophytes. No fracture or dislocation.  IMPRESSION: 1. Tricompartmental cartilage abnormalities as described above most severe in the medial femorotibial compartment. 2. Severe attenuation of the posterior horn of the medial meniscus consistent with a tear. 3. Moderate joint effusion.   Electronically Signed By: Elige Ko On: 02/02/2015 12:26   Narrative CLINICAL DATA:  Chronic bilateral knee pain.  No known injury.  EXAM: MRI OF THE RIGHT KNEE WITHOUT CONTRAST  TECHNIQUE: Multiplanar, multisequence MR imaging of the knee was performed. No intravenous contrast was administered.  COMPARISON:  None.  FINDINGS: MENISCI  Medial meniscus: Degenerative signal is seen in the posterior horn and body of the medial meniscus. Fraying along the free edge of the body is identified and the body is extruded peripherally. No discrete tear is seen.  Lateral meniscus:  Intact.  LIGAMENTS  Cruciates:  Intact.  Collaterals:  Intact.  CARTILAGE  Patellofemoral: Multifocal fissuring of hyaline cartilage is identified about the patella and femoral trochlea without underlying reactive marrow signal change.  Medial: Hyaline cartilage appears mildly thinned without focal defect.  Lateral:  Mildly thinned without focal defect.  Joint:  Small joint effusion is seen.  Popliteal Fossa:  No Baker's cyst.  Extensor Mechanism:  Intact.  Bones: No fracture, stress change or worrisome marrow lesion. Tricompartmental osteophytosis is identified.  IMPRESSION: Tricompartmental osteoarthritis is predominantly evidenced by osteophytosis.  Degenerated posterior horn and body of the medial meniscus without discrete tear. Fraying along the free edge of the body of the medial meniscus and extrusion of the body of the medial meniscus are identified.   Electronically Signed By: Drusilla Kanner M.D. On: 02/02/2015 12:34    Complexity Note: Imaging results reviewed.                         ROS  Cardiovascular: No reported cardiovascular signs or symptoms such as High blood pressure, coronary artery disease, abnormal heart rate or rhythm, heart attack, blood thinner therapy or heart weakness and/or failure Pulmonary or Respiratory: Wheezing and difficulty taking a deep full breath (Asthma) Neurological: Incontinence:  Urinary Psychological-Psychiatric: No reported psychological or psychiatric signs or symptoms such as difficulty sleeping, anxiety, depression, delusions or hallucinations (schizophrenial), mood swings (bipolar disorders) or suicidal ideations or attempts Gastrointestinal: Reflux or heatburn Genitourinary: No reported renal or genitourinary signs or symptoms such as difficulty voiding or producing urine, peeing blood, non-functioning kidney, kidney stones, difficulty emptying the bladder, difficulty controlling the flow of urine, or chronic kidney disease Hematological: Weakness due to low blood hemoglobin or red blood cell count (Anemia) Endocrine: High blood sugar requiring insulin (IDDM) Rheumatologic: No reported rheumatological signs and symptoms such as fatigue, joint pain, tenderness, swelling, redness, heat, stiffness, decreased range of motion, with or without associated rash Musculoskeletal: Negative for myasthenia gravis, muscular dystrophy, multiple sclerosis or malignant hyperthermia Work History: Working full time  Allergies  Kaitlyn Mcdaniel has No Known Allergies.  Laboratory Chemistry Profile   Renal Lab Results  Component Value Date   BUN 13 02/11/2023   CREATININE 0.92 02/11/2023   BCR 14 02/11/2023   GFRAA 79 04/11/2020   GFRNONAA 68 04/11/2020   SPECGRAV 1.015 08/05/2021   PHUR 5.5 08/05/2021   PROTEINUR Negative 08/05/2021  Electrolytes Lab Results  Component Value Date   NA 142 02/11/2023   K 4.1 02/11/2023   CL 105  02/11/2023   CALCIUM 9.2 02/11/2023     Hepatic Lab Results  Component Value Date   AST 12 05/12/2022   ALT 6 05/12/2022   ALBUMIN 4.2 05/12/2022   ALKPHOS 74 05/12/2022     ID No results found for: "LYMEIGGIGMAB", "HIV", "SARSCOV2NAA", "STAPHAUREUS", "MRSAPCR", "HCVAB", "PREGTESTUR", "RMSFIGG", "QFVRPH1IGG", "QFVRPH2IGG"   Bone Lab Results  Component Value Date   VD25OH 39.7 04/11/2020     Endocrine Lab Results  Component Value Date   GLUCOSE 86 02/11/2023   GLUCOSEU Negative 08/05/2021   HGBA1C 5.2 02/11/2023   TSH 0.640 05/12/2022   FREET4 1.21 11/29/2019     Neuropathy Lab Results  Component Value Date   VITAMINB12 470 05/12/2022   FOLATE 5.0 11/29/2019   HGBA1C 5.2 02/11/2023     CNS No results found for: "COLORCSF", "APPEARCSF", "RBCCOUNTCSF", "WBCCSF", "POLYSCSF", "LYMPHSCSF", "EOSCSF", "PROTEINCSF", "GLUCCSF", "JCVIRUS", "CSFOLI", "IGGCSF", "LABACHR", "ACETBL"   Inflammation (CRP: Acute  ESR: Chronic) No results found for: "CRP", "ESRSEDRATE", "LATICACIDVEN"   Rheumatology No results found for: "RF", "ANA", "LABURIC", "URICUR", "LYMEIGGIGMAB", "LYMEABIGMQN", "HLAB27"   Coagulation Lab Results  Component Value Date   PLT 249 05/12/2022     Cardiovascular Lab Results  Component Value Date   HGB 11.5 05/12/2022   HCT 36.5 05/12/2022     Screening No results found for: "SARSCOV2NAA", "COVIDSOURCE", "STAPHAUREUS", "MRSAPCR", "HCVAB", "HIV", "PREGTESTUR"   Cancer No results found for: "CEA", "CA125", "LABCA2"   Allergens No results found for: "ALMOND", "APPLE", "ASPARAGUS", "AVOCADO", "BANANA", "BARLEY", "BASIL", "BAYLEAF", "GREENBEAN", "LIMABEAN", "WHITEBEAN", "BEEFIGE", "REDBEET", "BLUEBERRY", "BROCCOLI", "CABBAGE", "MELON", "CARROT", "CASEIN", "CASHEWNUT", "CAULIFLOWER", "CELERY"     Note: Lab results reviewed.  PFSH  Drug: Kaitlyn Mcdaniel  reports no history of drug use. Alcohol:  reports current alcohol use of about 1.0 standard drink of  alcohol per week. Tobacco:  reports that she has never smoked. She has never used smokeless tobacco. Medical:  has a past medical history of Anemia, Asthma, Back pain, Dependent edema, Dyspnea, Family history of kidney cancer, Family history of pancreatic cancer, Family history of uterine cancer, GERD (gastroesophageal reflux disease), Joint pain, Osteoarthritis, S/P TKR (total knee replacement), bilateral, Sleep apnea, and Vitamin B 12 deficiency. Family: family history includes Alcoholism in her father; Anxiety disorder in her mother; Depression in her mother; Hyperlipidemia in her father and mother; Hypertension in her father and mother; Obesity in her father; Renal cancer in her father; Thyroid disease in her mother.  Past Surgical History:  Procedure Laterality Date   BREAST REDUCTION SURGERY  1999   CERVIX LESION DESTRUCTION  1998   abnormal PAP   REDUCTION MAMMAPLASTY Bilateral 1999   TOTAL KNEE ARTHROPLASTY Left 06/2016   TOTAL KNEE ARTHROPLASTY Right 09/2016   Active Ambulatory Problems    Diagnosis Date Noted   Exercise-induced asthma 04/29/2015   Degenerative arthritis of knee, bilateral 03/08/2015   GERD without esophagitis 04/29/2015   Hx of abnormal cervical Papanicolaou smear 04/29/2015   OSA (obstructive sleep apnea) 08/06/2015   Diabetes mellitus treated with injections of non-insulin medication (HCC) 09/05/2015   Status post total knee replacement, left 09/02/2016   Status post total knee replacement, 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   Hyperlipidemia associated with type 2 diabetes mellitus (HCC) 02/24/2018   Chronic iron deficiency anemia 03/26/2018   Situational anxiety 03/27/2018   Obesity 11/26/2018   Abnormal uterine bleeding 11/26/2018   B12 deficiency anemia  06/09/2019   Family history of uterine cancer 05/30/2021   Family history of pancreatic cancer 05/30/2021   Family history of kidney cancer 05/30/2021   Genetic testing 06/12/2021   BMI 45.0-49.9, adult (HCC) 09/19/2021    Sacroiliac joint pain 01/06/2023   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   Spondylosis of lumbosacral region without myelopathy or radiculopathy 01/27/2023   Resolved Ambulatory Problems    Diagnosis Date Noted   No Resolved Ambulatory Problems   Past Medical History:  Diagnosis Date   Anemia    Asthma    Back pain    Dependent edema    Dyspnea    GERD (gastroesophageal reflux disease)    Joint pain    Osteoarthritis    S/P TKR (total knee replacement), bilateral    Sleep apnea    Vitamin B 12 deficiency    Constitutional Exam  General appearance: Well nourished, well developed, and well hydrated. In no apparent acute distress Vitals:   03/10/23 0851  BP: 123/60  Pulse: 63  Temp: (!) 97.4 F (36.3 C)  TempSrc: Temporal  SpO2: 100%  Weight: 195 lb (88.5 kg)  Height: 5\' 3"  (1.6 m)   BMI Assessment: Estimated body mass index is 34.54 kg/m as calculated from the following:   Height as of this encounter: 5\' 3"  (1.6 m).   Weight as of this encounter: 195 lb (88.5 kg).  BMI interpretation table: BMI level Category Range association with higher incidence of chronic pain  <18 kg/m2 Underweight   18.5-24.9 kg/m2 Ideal body weight   25-29.9 kg/m2 Overweight Increased incidence by 20%  30-34.9 kg/m2 Obese (Class I) Increased incidence by 68%  35-39.9 kg/m2 Severe obesity (Class II) Increased incidence by 136%  >40 kg/m2 Extreme obesity (Class III) Increased incidence by 254%   Patient's current BMI Ideal Body weight  Body mass index is 34.54 kg/m. Ideal body weight: 52.4 kg (115 lb 8.3 oz) Adjusted ideal body weight: 66.8 kg (147 lb 5 oz)   BMI Readings from Last 4 Encounters:  03/10/23 34.54 kg/m  02/11/23 33.83 kg/m  01/27/23 35.25 kg/m  01/06/23 34.90 kg/m   Wt Readings from Last 4 Encounters:  03/10/23 195 lb (88.5 kg)  02/11/23 191 lb (86.6 kg)  01/27/23 199 lb (90.3 kg)  01/06/23 197 lb (89.4 kg)    Psych/Mental status: Alert,  oriented x 3 (person, place, & time)       Eyes: PERLA Respiratory: No evidence of acute respiratory distress  Thoracic Spine Area Exam  Skin & Axial Inspection: No masses, redness, or swelling Alignment: Symmetrical Functional ROM: Unrestricted ROM Stability: No instability detected Muscle Tone/Strength: Functionally intact. No obvious neuro-muscular anomalies detected. Sensory (Neurological): Unimpaired Muscle strength & Tone: No palpable anomalies Lumbar Spine Area Exam  Skin & Axial Inspection: No masses, redness, or swelling Alignment: Symmetrical Functional ROM: Pain restricted ROM affecting primarily the right Stability: No instability detected Muscle Tone/Strength: Functionally intact. No obvious neuro-muscular anomalies detected. Sensory (Neurological): Musculoskeletal pain pattern facet mediated Palpation: No palpable anomalies       Provocative Tests: Hyperextension/rotation test: (+) on the right for facet joint pain. Lumbar quadrant test (Kemp's test): (+) on the right for facet joint pain.  Gait & Posture Assessment  Ambulation: Unassisted Gait: Relatively normal for age and body habitus Posture: WNL  Lower Extremity Exam    Side: Right lower extremity  Side: Left lower extremity  Stability: No instability observed          Stability: No  instability observed          Skin & Extremity Inspection: Skin color, temperature, and hair growth are WNL. No peripheral edema or cyanosis. No masses, redness, swelling, asymmetry, or associated skin lesions. No contractures.  Skin & Extremity Inspection: Skin color, temperature, and hair growth are WNL. No peripheral edema or cyanosis. No masses, redness, swelling, asymmetry, or associated skin lesions. No contractures.  Functional ROM: Unrestricted ROM                  Functional ROM: Unrestricted ROM                  Muscle Tone/Strength: Functionally intact. No obvious neuro-muscular anomalies detected.  Muscle Tone/Strength:  Functionally intact. No obvious neuro-muscular anomalies detected.  Sensory (Neurological): Unimpaired        Sensory (Neurological): Unimpaired        DTR: Patellar: deferred today Achilles: deferred today Plantar: deferred today  DTR: Patellar: deferred today Achilles: deferred today Plantar: deferred today  Palpation: No palpable anomalies  Palpation: No palpable anomalies    Assessment  Primary Diagnosis & Pertinent Problem List: The primary encounter diagnosis was Lumbar facet arthropathy. Diagnoses of Lumbar spondylosis and Lumbar degenerative disc disease were also pertinent to this visit.  Visit Diagnosis (New problems to examiner): 1. Lumbar facet arthropathy   2. Lumbar spondylosis   3. Lumbar degenerative disc disease    Plan of Care (Initial workup plan)  Kaitlyn Mcdaniel has a history of greater than 3 months of moderate to severe pain which is resulted in functional impairment.  The patient has tried various conservative therapeutic options such as NSAIDs, Tylenol, muscle relaxants, physical therapy which was inadequately effective.  Patient's pain is predominantly axial with physical exam and L-MRI findings suggestive of facet arthropathy. Lumbar facet medial branch nerve blocks were discussed with the patient.  Risks and benefits were reviewed.  Patient would like to proceed with bilateral L3, L4, L5 medial branch nerve block. Continue with PT.   Procedure Orders         LUMBAR FACET(MEDIAL BRANCH NERVE BLOCK) MBNB     Provider-requested follow-up: Return in about 13 days (around 03/23/2023) for Right L3, 4, 5 MBNB #1, in clinic (PO Valium).  Future Appointments  Date Time Provider Department Center  03/16/2023  8:00 AM Huprich, Sharalyn Ink, PT [facility] None  05/19/2023  8:40 AM [name] Nyoka Cowden, MD [facility]    Duration of encounter: .  Total time on encounter, as per AMA guidelines included both the face-to-face and non-face-to-face time personally spent by  the physician and/or other qualified health care professional(s) on the day of the encounter (includes time in activities that require the physician or other qualified health care professional and does not include time in activities normally performed by clinical staff). Physician's time may include the following activities when performed: Preparing to see the patient (e.g., pre-charting review of records, searching for previously ordered imaging, lab work, and nerve conduction tests) Review of prior analgesic pharmacotherapies. Reviewing PMP Interpreting ordered tests (e.g., lab work, imaging, nerve conduction tests) Performing post-procedure evaluations, including interpretation of diagnostic procedures Obtaining and/or reviewing separately obtained history Performing a medically appropriate examination and/or evaluation Counseling and educating the patient/family/caregiver Ordering medications, tests, or procedures Referring and communicating with other health care professionals (when not separately reported) Documenting clinical information in the electronic or other health record Independently interpreting results (not separately reported) and communicating results to the patient/ family/caregiver Care coordination (not  separately reported)  Note by: Edward Jolly, MD (TTS technology used. I apologize for any typographical errors that were not detected and corrected.) Date: 03/10/2023; Time: 9:17 AM

## 2023-03-16 ENCOUNTER — Ambulatory Visit: Payer: BC Managed Care – PPO

## 2023-03-16 DIAGNOSIS — M6281 Muscle weakness (generalized): Secondary | ICD-10-CM

## 2023-03-16 DIAGNOSIS — M5459 Other low back pain: Secondary | ICD-10-CM

## 2023-03-23 ENCOUNTER — Ambulatory Visit
Admission: RE | Admit: 2023-03-23 | Discharge: 2023-03-23 | Disposition: A | Discharge status: Home / Self Care | Payer: BC Managed Care – PPO | Source: Ambulatory Visit | Attending: Student in an Organized Health Care Education/Training Program | Admitting: Student in an Organized Health Care Education/Training Program

## 2023-03-23 ENCOUNTER — Ambulatory Visit
Payer: BC Managed Care – PPO | Attending: Student in an Organized Health Care Education/Training Program | Admitting: Student in an Organized Health Care Education/Training Program

## 2023-03-23 ENCOUNTER — Encounter: Payer: Self-pay | Admitting: Student in an Organized Health Care Education/Training Program

## 2023-03-23 DIAGNOSIS — M47816 Spondylosis without myelopathy or radiculopathy, lumbar region: Secondary | ICD-10-CM | POA: Diagnosis not present

## 2023-03-23 MED ORDER — LIDOCAINE HCL 2 % IJ SOLN
20.0000 mL | Freq: Once | INTRAMUSCULAR | Status: AC
  Administered 2023-03-23: 200 mg

## 2023-03-23 MED ORDER — ROPIVACAINE HCL 2 MG/ML IJ SOLN
9.0000 mL | Freq: Once | INTRAMUSCULAR | Status: AC
  Administered 2023-03-23: 9 mL via PERINEURAL

## 2023-03-23 MED ORDER — ROPIVACAINE HCL 2 MG/ML IJ SOLN
INTRAMUSCULAR | Status: AC
  Filled 2023-03-23: qty 20

## 2023-03-23 MED ORDER — DIAZEPAM 5 MG PO TABS
ORAL_TABLET | ORAL | Status: AC
  Filled 2023-03-23: qty 1

## 2023-03-23 MED ORDER — LIDOCAINE HCL (PF) 2 % IJ SOLN
INTRAMUSCULAR | Status: AC
  Filled 2023-03-23: qty 10

## 2023-03-23 MED ORDER — DEXAMETHASONE SODIUM PHOSPHATE 10 MG/ML IJ SOLN
10.0000 mg | Freq: Once | INTRAMUSCULAR | Status: AC
  Administered 2023-03-23: 10 mg

## 2023-03-23 MED ORDER — DEXAMETHASONE SODIUM PHOSPHATE 10 MG/ML IJ SOLN
INTRAMUSCULAR | Status: AC
  Filled 2023-03-23: qty 1

## 2023-03-23 MED ORDER — DIAZEPAM 5 MG PO TABS
5.0000 mg | ORAL_TABLET | ORAL | Status: AC
  Administered 2023-03-23: 5 mg via ORAL

## 2023-03-23 NOTE — Progress Notes (Signed)
PROVIDER NOTE: Interpretation of information contained herein should be left to medically-trained personnel. Specific patient instructions are provided elsewhere under "Patient Instructions" section of medical record. This document was created in part using STT-dictation technology, any transcriptional errors that may result from this process are unintentional.  Patient: Kaitlyn Mcdaniel Type: Established DOB: 1970/09/26 MRN: [id number] PCP: Reubin Milan, MD  Service: Procedure DOS: 03/23/2023 Setting: Ambulatory Location: Ambulatory outpatient facility Delivery: Face-to-face Provider: Edward Jolly, MD Specialty: Interventional Pain Management Specialty designation: 09 Location: Outpatient facility Ref. Prov.: Edward Jolly, MD       Interventional Therapy   Procedure: Lumbar Facet, Medial Branch Block(s) #1  Laterality: Right  Level: L3, L4, and L5 Medial Branch Level(s). Injecting these levels blocks the L3-4 and L4-5 lumbar facet joints. Imaging: Fluoroscopic guidance         Anesthesia: Local anesthesia (1-2% Lidocaine) Anxiolysis: Valium 5 mg PO DOS: 03/23/2023 Performed by: Edward Jolly, MD  Primary Purpose: Diagnostic/Therapeutic Indications: Low back pain severe enough to impact quality of life or function. 1. Lumbar facet arthropathy   2. Lumbar spondylosis    NAS-11 Pain score:   Pre-procedure: 5 /10   Post-procedure: 0-No pain/10     Position / Prep / Materials:  Position: Prone  Prep solution: DuraPrep (Iodine Povacrylex [0.7% available iodine] and Isopropyl Alcohol, 74% w/w) Area Prepped: Posterolateral Lumbosacral Spine (Wide prep: From the lower border of the scapula down to the end of the tailbone and from flank to flank.)  Materials:  Tray: Block Needle(s):  Type: Spinal  Gauge (G): 22  Length: 3.5-in Qty: 2      H&P (Pre-op Assessment):  Kaitlyn Mcdaniel is a 52 y.o. (year old), female patient, seen today for interventional treatment. She  has a past  surgical history that includes Breast reduction surgery (1999); Cervix lesion destruction (1998); Total knee arthroplasty (Left, 06/2016); Total knee arthroplasty (Right, 09/2016); and Reduction mammaplasty (Bilateral, 1999). [name] has a current medication list which includes the following prescription(s): acetaminophen, albuterol, cetirizine, diclofenac, ferrous gluconate, furosemide, mirabegron er, NON FORMULARY, omeprazole, probiotic product, and tirzepatide. Her primarily concern today is the Back Pain  Initial Vital Signs:  Pulse/HCG Rate: 71ECG Heart Rate: 64 Temp: 97.9 F (36.6 C) Resp: 16 BP: 122/80 SpO2: 100 %  BMI: Estimated body mass index is 35.43 kg/m as calculated from the following:   Height as of this encounter: 5\' 3"  (1.6 m).   Weight as of this encounter: 200 lb (90.7 kg).  Risk Assessment: Allergies: Reviewed. She has No Known Allergies.  Allergy Precautions: None required Coagulopathies: Reviewed. None identified.  Blood-thinner therapy: None at this time Active Infection(s): Reviewed. None identified. [name] is afebrile  Site Confirmation: [name] was asked to confirm the procedure and laterality before marking the site Procedure checklist: Completed Consent: Before the procedure and under the influence of no sedative(s), amnesic(s), or anxiolytics, the patient was informed of the treatment options, risks and possible complications. To fulfill our ethical and legal obligations, as recommended by the American Medical Association's Code of Ethics, I have informed the patient of my clinical impression; the nature and purpose of the treatment or procedure; the risks, benefits, and possible complications of the intervention; the alternatives, including doing nothing; the risk(s) and benefit(s) of the alternative treatment(s) or procedure(s); and the risk(s) and benefit(s) of doing nothing. The patient was provided information about the general risks and  possible complications associated with the procedure. These may include, but are not limited to: failure  to achieve desired goals, infection, bleeding, organ or nerve damage, allergic reactions, paralysis, and death. In addition, the patient was informed of those risks and complications associated to Spine-related procedures, such as failure to decrease pain; infection (i.e.: Meningitis, epidural or intraspinal abscess); bleeding (i.e.: epidural hematoma, subarachnoid hemorrhage, or any other type of intraspinal or peri-dural bleeding); organ or nerve damage (i.e.: Any type of peripheral nerve, nerve root, or spinal cord injury) with subsequent damage to sensory, motor, and/or autonomic systems, resulting in permanent pain, numbness, and/or weakness of one or several areas of the body; allergic reactions; (i.e.: anaphylactic reaction); and/or death. Furthermore, the patient was informed of those risks and complications associated with the medications. These include, but are not limited to: allergic reactions (i.e.: anaphylactic or anaphylactoid reaction(s)); adrenal axis suppression; blood sugar elevation that in diabetics may result in ketoacidosis or comma; water retention that in patients with history of congestive heart failure may result in shortness of breath, pulmonary edema, and decompensation with resultant heart failure; weight gain; swelling or edema; medication-induced neural toxicity; particulate matter embolism and blood vessel occlusion with resultant organ, and/or nervous system infarction; and/or aseptic necrosis of one or more joints. Finally, the patient was informed that Medicine is not an exact science; therefore, there is also the possibility of unforeseen or unpredictable risks and/or possible complications that may result in a catastrophic outcome. The patient indicated having understood very clearly. We have given the patient no guarantees and we have made no promises. Enough time was  given to the patient to ask questions, all of which were answered to the patient's satisfaction. [name] has indicated that she wanted to continue with the procedure. Attestation: I, the ordering provider, attest that I have discussed with the patient the benefits, risks, side-effects, alternatives, likelihood of achieving goals, and potential problems during recovery for the procedure that I have provided informed consent. Date  Time: 03/23/2023  1:27 PM   Pre-Procedure Preparation:  Monitoring: As per clinic protocol. Respiration, ETCO2, SpO2, BP, heart rate and rhythm monitor placed and checked for adequate function Safety Precautions: Patient was assessed for positional comfort and pressure points before starting the procedure. Time-out: I initiated and conducted the "Time-out" before starting the procedure, as per protocol. The patient was asked to participate by confirming the accuracy of the "Time Out" information. Verification of the correct person, site, and procedure were performed and confirmed by me, the nursing staff, and the patient. "Time-out" conducted as per Joint Commission's Universal Protocol (UP.01.01.01). Time: 1420 Start Time: 1420 hrs.  Description of Procedure:          Laterality: (see above) Targeted Levels: (see above)  Safety Precautions: Aspiration looking for blood return was conducted prior to all injections. At no point did we inject any substances, as a needle was being advanced. Before injecting, the patient was told to immediately notify me if she was experiencing any new onset of "ringing in the ears, or metallic taste in the mouth". No attempts were made at seeking any paresthesias. Safe injection practices and needle disposal techniques used. Medications properly checked for expiration dates. SDV (single dose vial) medications used. After the completion of the procedure, all disposable equipment used was discarded in the proper designated medical waste  containers. Local Anesthesia: Protocol guidelines were followed. The patient was positioned over the fluoroscopy table. The area was prepped in the usual manner. The time-out was completed. The target area was identified using fluoroscopy. A 12-in long, straight, sterile hemostat was used  with fluoroscopic guidance to locate the targets for each level blocked. Once located, the skin was marked with an approved surgical skin marker. Once all sites were marked, the skin (epidermis, dermis, and hypodermis), as well as deeper tissues (fat, connective tissue and muscle) were infiltrated with a small amount of a short-acting local anesthetic, loaded on a 10cc syringe with a 25G, 1.5-in  Needle. An appropriate amount of time was allowed for local anesthetics to take effect before proceeding to the next step. Local Anesthetic: Lidocaine 2.0% The unused portion of the local anesthetic was discarded in the proper designated containers. Technical description of process:   L3 Medial Branch Nerve Block (MBB): The target area for the L3 medial branch is at the junction of the postero-lateral aspect of the superior articular process and the superior, posterior, and medial edge of the transverse process of L4. Under fluoroscopic guidance, a Quincke needle was inserted until contact was made with os over the superior postero-lateral aspect of the pedicular shadow (target area). After negative aspiration for blood, 2mL of the nerve block solution was injected without difficulty or complication. The needle was removed intact. L4 Medial Branch Nerve Block (MBB): The target area for the L4 medial branch is at the junction of the postero-lateral aspect of the superior articular process and the superior, posterior, and medial edge of the transverse process of L5. Under fluoroscopic guidance, a Quincke needle was inserted until contact was made with os over the superior postero-lateral aspect of the pedicular shadow (target area).  After negative aspiration for blood, 2mL of the nerve block solution was injected without difficulty or complication. The needle was removed intact. L5 Medial Branch Nerve Block (MBB): The target area for the L5 medial branch is at the junction of the postero-lateral aspect of the superior articular process and the superior, posterior, and medial edge of the sacral ala. Under fluoroscopic guidance, a Quincke needle was inserted until contact was made with os over the superior postero-lateral aspect of the pedicular shadow (target area). After negative aspiration for blood, 2mL of the nerve block solution was injected without difficulty or complication. The needle was removed intact.   Once the entire procedure was completed, the treated area was cleaned, making sure to leave some of the prepping solution back to take advantage of its long term bactericidal properties.         Illustration of the posterior view of the lumbar spine and the posterior neural structures. Laminae of L2 through S1 are labeled. DPRL5, dorsal primary ramus of L5; DPRS1, dorsal primary ramus of S1; DPR3, dorsal primary ramus of L3; FJ, facet (zygapophyseal) joint L3-L4; I, inferior articular process of L4; LB1, lateral branch of dorsal primary ramus of L1; IAB, inferior articular branches from L3 medial branch (supplies L4-L5 facet joint); IBP, intermediate branch plexus; MB3, medial branch of dorsal primary ramus of L3; NR3, third lumbar nerve root; S, superior articular process of L5; SAB, superior articular branches from L4 (supplies L4-5 facet joint also); TP3, transverse process of L3.   Facet Joint Innervation (* possible contribution)  L1-2 T12, L1 (L2*)  Medial Branch  L2-3 L1, L2 (L3*)         "          "  L3-4 L2, L3 (L4*)         "          "  L4-5 L3, L4 (L5*)         "          "  L5-S1 L4, L5, S1          "          "    Vitals:   03/23/23 1414 03/23/23 1418 03/23/23 1423 03/23/23 1429  BP: 126/79 115/78  118/82 124/80  Pulse:      Resp: 14 13 15 19   Temp:      TempSrc:      SpO2: 100% 100% 100% 100%  Weight:      Height:         End Time: 1429 hrs.  Imaging Guidance (Spinal):          Type of Imaging Technique: Fluoroscopy Guidance (Spinal) Indication(s): Assistance in needle guidance and placement for procedures requiring needle placement in or near specific anatomical locations not easily accessible without such assistance. Exposure Time: Please see nurses notes. Contrast: None used. Fluoroscopic Guidance: I was personally present during the use of fluoroscopy. "Tunnel Vision Technique" used to obtain the best possible view of the target area. Parallax error corrected before commencing the procedure. "Direction-depth-direction" technique used to introduce the needle under continuous pulsed fluoroscopy. Once target was reached, antero-posterior, oblique, and lateral fluoroscopic projection used confirm needle placement in all planes. Images permanently stored in EMR. Interpretation: No contrast injected. I personally interpreted the imaging intraoperatively. Adequate needle placement confirmed in multiple planes. Permanent images saved into the patient's record.  Post-operative Assessment:  Post-procedure Vital Signs:  Pulse/HCG Rate: 7170 Temp: 97.9 F (36.6 C) Resp: 19 BP: 124/80 SpO2: 100 %  EBL: None  Complications: No immediate post-treatment complications observed by team, or reported by patient.  Note: The patient tolerated the entire procedure well. A repeat set of vitals were taken after the procedure and the patient was kept under observation following institutional policy, for this type of procedure. Post-procedural neurological assessment was performed, showing return to baseline, prior to discharge. The patient was provided with post-procedure discharge instructions, including a section on how to identify potential problems. Should any problems arise concerning this  procedure, the patient was given instructions to immediately contact us, at any time, without hesitation. In any case, we plan to contact the patient by telephone for a follow-up status report regarding this interventional procedure.  Comments:  No additional relevant information.  Plan of Care (POC)  Orders:  Orders Placed This Encounter  Procedures   DG PAIN CLIN[facility] C-ARM 1-60 MIN NO REPORT    Intraoperative interpretation by procedural physician at [facility].    Standing Status:   Standing    Number of Occurrences:   1    Order Specific Question:   Reason for exam:    Answer:   Assistance in needle guidance and placement for procedures requiring needle placement in or near specific anatomical locations not easily accessible without such assistance.    Medications ordered for procedure: Meds ordered this encounter  Medications   lidocaine (XYLOCAINE) 2 % (with pres) injection 400 mg   diazepam (VALIUM) tablet 5 mg    Make sure Flumazenil is available in the pyxis when using this medication. If oversedation occurs, administer 0.2 mg IV over 15 sec. If after 45 sec no response, administer 0.2 mg again over 1 min; may repeat at 1 min intervals; not to exceed 4 doses (1 mg)   dexamethasone (DECADRON) injection 10 mg   ropivacaine (PF) 2 mg/mL (0.2%) (NAROPIN) injection 9 mL   Medications administered: We administered lidocaine, diazepam, dexamethasone, and ropivacaine (PF) 2 mg/mL (0.2%).  See the medical  record for exact dosing, route, and time of administration.  Follow-up plan:   Return in about 4 weeks (around 04/20/2023), or PPE, F2F.       Right L3,4,5 MBNB 03/23/23    Recent Visits Date Type Provider Dept  03/10/23 Office Visit Edward Jolly, MD [facility]  Showing recent visits within past 90 days and meeting all other requirements Today's Visits Date Type Provider Dept  03/23/23 Procedure visit Edward Jolly, MD [facility]  Showing  today's visits and meeting all other requirements Future Appointments Date Type Provider Dept  04/20/23 Appointment Edward Jolly, MD [facility]  Showing future appointments within next 90 days and meeting all other requirements  Disposition: Discharge home  Discharge (Date  Time): 03/23/2023; 1437 hrs.   Primary Care Physician: Reubin Milan, MD Location: [facility] Note by: Edward Jolly, MD (TTS technology used. I apologize for any typographical errors that were not detected and corrected.) Date: 03/23/2023; Time: 3:33 PM  Disclaimer:  Medicine is not an Visual merchandiser. The only guarantee in medicine is that nothing is guaranteed. It is important to note that the decision to proceed with this intervention was based on the information collected from the patient. The Data and conclusions were drawn from the patient's questionnaire, the interview, and the physical examination. Because the information was provided in large part by the patient, it cannot be guaranteed that it has not been purposely or unconsciously manipulated. Every effort has been made to obtain as much relevant data as possible for this evaluation. It is important to note that the conclusions that lead to this procedure are derived in large part from the available data. Always take into account that the treatment will also be dependent on availability of resources and existing treatment guidelines, considered by other Pain Management Practitioners as being common knowledge and practice, at the time of the intervention. For Medico-Legal purposes, it is also important to point out that variation in procedural techniques and pharmacological choices are the acceptable norm. The indications, contraindications, technique, and results of the above procedure should only be interpreted and judged by a Board-Certified Interventional Pain Specialist with extensive familiarity and expertise in the same  exact procedure and technique.

## 2023-03-23 NOTE — Patient Instructions (Signed)

## 2023-03-24 ENCOUNTER — Telehealth: Payer: Self-pay | Admitting: *Deleted

## 2023-03-24 NOTE — Telephone Encounter (Signed)
Attempted to call for post procedure follow-up. Message left. 

## 2023-03-29 ENCOUNTER — Other Ambulatory Visit: Payer: Self-pay | Admitting: Internal Medicine

## 2023-03-29 DIAGNOSIS — E118 Type 2 diabetes mellitus with unspecified complications: Secondary | ICD-10-CM

## 2023-03-30 ENCOUNTER — Other Ambulatory Visit: Payer: Self-pay

## 2023-03-30 MED ORDER — MOUNJARO 12.5 MG/0.5ML ~~LOC~~ SOAJ
12.5000 mg | SUBCUTANEOUS | 0 refills | Status: DC
Start: 2023-03-30 — End: 2023-04-28
  Filled 2023-03-30: qty 2, 28d supply, fill #0

## 2023-04-20 ENCOUNTER — Ambulatory Visit: Payer: BC Managed Care – PPO | Admitting: Student in an Organized Health Care Education/Training Program

## 2023-04-28 ENCOUNTER — Other Ambulatory Visit: Payer: Self-pay

## 2023-04-28 ENCOUNTER — Encounter: Payer: Self-pay | Admitting: Internal Medicine

## 2023-04-28 ENCOUNTER — Other Ambulatory Visit: Payer: Self-pay | Admitting: Internal Medicine

## 2023-04-28 DIAGNOSIS — Z6841 Body Mass Index (BMI) 40.0 and over, adult: Secondary | ICD-10-CM

## 2023-04-28 MED ORDER — TIRZEPATIDE 10 MG/0.5ML ~~LOC~~ SOAJ
10.0000 mg | SUBCUTANEOUS | 0 refills | Status: DC
Start: 2023-04-28 — End: 2023-07-23
  Filled 2023-04-28: qty 2, 28d supply, fill #0
  Filled 2023-05-24: qty 2, 28d supply, fill #1
  Filled 2023-06-26: qty 2, 28d supply, fill #2

## 2023-04-30 ENCOUNTER — Telehealth: Payer: Self-pay

## 2023-04-30 NOTE — Telephone Encounter (Signed)
Completed PA on [email or web address] for monjouro 10mg .  (Key: [id number]) PA Case ID #: [id number] Rx #: [id number]  - Awaiting outcome.

## 2023-05-04 ENCOUNTER — Other Ambulatory Visit: Payer: Self-pay

## 2023-05-04 NOTE — Telephone Encounter (Signed)
Denied  message sent to pt via Mychart.  KP

## 2023-05-05 ENCOUNTER — Other Ambulatory Visit: Payer: Self-pay

## 2023-05-06 ENCOUNTER — Telehealth: Payer: Self-pay

## 2023-05-06 NOTE — Telephone Encounter (Signed)
Approved 05/05/23-05/04/26  KP

## 2023-05-19 ENCOUNTER — Other Ambulatory Visit
Admission: RE | Admit: 2023-05-19 | Discharge: 2023-05-19 | Disposition: A | Discharge status: Home / Self Care | Payer: BC Managed Care – PPO | Attending: Internal Medicine | Admitting: Internal Medicine

## 2023-05-19 ENCOUNTER — Ambulatory Visit (INDEPENDENT_AMBULATORY_CARE_PROVIDER_SITE_OTHER): Payer: BC Managed Care – PPO | Admitting: Internal Medicine

## 2023-05-19 ENCOUNTER — Encounter: Payer: Self-pay | Admitting: Internal Medicine

## 2023-05-19 VITALS — BP 118/74 | HR 91 | Ht 63.0 in | Wt 192.2 lb

## 2023-05-19 DIAGNOSIS — Z7985 Long-term (current) use of injectable non-insulin antidiabetic drugs: Secondary | ICD-10-CM | POA: Diagnosis not present

## 2023-05-19 DIAGNOSIS — E119 Type 2 diabetes mellitus without complications: Secondary | ICD-10-CM

## 2023-05-19 DIAGNOSIS — Z0000 Encounter for general adult medical examination without abnormal findings: Secondary | ICD-10-CM

## 2023-05-19 DIAGNOSIS — K219 Gastro-esophageal reflux disease without esophagitis: Secondary | ICD-10-CM

## 2023-05-19 DIAGNOSIS — E7800 Pure hypercholesterolemia, unspecified: Secondary | ICD-10-CM | POA: Diagnosis not present

## 2023-05-19 DIAGNOSIS — Z79899 Other long term (current) drug therapy: Secondary | ICD-10-CM

## 2023-05-19 DIAGNOSIS — E785 Hyperlipidemia, unspecified: Secondary | ICD-10-CM | POA: Diagnosis not present

## 2023-05-19 DIAGNOSIS — Z1231 Encounter for screening mammogram for malignant neoplasm of breast: Secondary | ICD-10-CM | POA: Diagnosis not present

## 2023-05-19 DIAGNOSIS — G4733 Obstructive sleep apnea (adult) (pediatric): Secondary | ICD-10-CM

## 2023-05-19 DIAGNOSIS — E1169 Type 2 diabetes mellitus with other specified complication: Secondary | ICD-10-CM

## 2023-05-19 DIAGNOSIS — Z1211 Encounter for screening for malignant neoplasm of colon: Secondary | ICD-10-CM | POA: Diagnosis not present

## 2023-05-19 LAB — LIPID PANEL
Cholesterol: 209 mg/dL — ABNORMAL HIGH (ref 0–200)
HDL: 48 mg/dL (ref >40)
LDL Cholesterol: 147 mg/dL — ABNORMAL HIGH (ref 0–99)
Total CHOL/HDL Ratio: 4.4 ratio
Triglycerides: 69 mg/dL (ref <150)
VLDL: 14 mg/dL (ref 0–40)

## 2023-05-19 LAB — CBC WITH DIFFERENTIAL/PLATELET
Abs Immature Granulocytes: 0.01 10*3/uL (ref 0.00–0.07)
Basophils Absolute: 0 10*3/uL (ref 0.0–0.1)
Basophils Relative: 1 %
Eosinophils Absolute: 0 10*3/uL (ref 0.0–0.5)
Eosinophils Relative: 1 %
HCT: 38.1 % (ref 36.0–46.0)
Hemoglobin: 12.3 g/dL (ref 12.0–15.0)
Immature Granulocytes: 0 %
Lymphocytes Relative: 30 %
Lymphs Abs: 1.7 10*3/uL (ref 0.7–4.0)
MCH: 26.7 pg (ref 26.0–34.0)
MCHC: 32.3 g/dL (ref 30.0–36.0)
MCV: 82.6 fL (ref 80.0–100.0)
Monocytes Absolute: 0.4 10*3/uL (ref 0.1–1.0)
Monocytes Relative: 7 %
Neutro Abs: 3.6 10*3/uL (ref 1.7–7.7)
Neutrophils Relative %: 61 %
Platelets: 216 10*3/uL (ref 150–400)
RBC: 4.61 MIL/uL (ref 3.87–5.11)
RDW: 15.3 % (ref 11.5–15.5)
WBC: 5.7 10*3/uL (ref 4.0–10.5)
nRBC: 0 % (ref 0.0–0.2)

## 2023-05-19 LAB — COMPREHENSIVE METABOLIC PANEL
ALT: 6 U/L (ref 0–44)
AST: 9 U/L — ABNORMAL LOW (ref 15–41)
Albumin: 3.8 g/dL (ref 3.5–5.0)
Alkaline Phosphatase: 51 U/L (ref 38–126)
Anion gap: 7 (ref 5–15)
BUN: 18 mg/dL (ref 6–20)
CO2: 27 mmol/L (ref 22–32)
Calcium: 8.8 mg/dL — ABNORMAL LOW (ref 8.9–10.3)
Chloride: 102 mmol/L (ref 98–111)
Creatinine, Ser: 0.97 mg/dL (ref 0.44–1.00)
GFR, Estimated: >60 mL/min (ref >60)
Glucose, Bld: 86 mg/dL (ref 70–99)
Potassium: 3.9 mmol/L (ref 3.5–5.1)
Sodium: 136 mmol/L (ref 135–145)
Total Bilirubin: 0.5 mg/dL (ref 0.3–1.2)
Total Protein: 6.8 g/dL (ref 6.5–8.1)

## 2023-05-19 LAB — HEMOGLOBIN A1C
Hgb A1c MFr Bld: 5 % (ref 4.8–5.6)
Mean Plasma Glucose: 96.8 mg/dL

## 2023-05-19 LAB — TSH: TSH: 0.817 u[IU]/mL (ref 0.350–4.500)

## 2023-05-19 NOTE — Assessment & Plan Note (Addendum)
Should be on statin therapy but patient declines due to fam hx dementia. Will consider Zetia when labs return Lab Results  Component Value Date   LDLCALC 137 (H) 05/12/2022

## 2023-05-19 NOTE — Assessment & Plan Note (Signed)
Not currently using CPAP due to anxiety - she no longer snores per her husband since the weight loss

## 2023-05-19 NOTE — Assessment & Plan Note (Addendum)
Blood sugars stable without hypoglycemic symptoms or events. Current regimen is Mounjaro. Changes made last visit are reducing the dose to 10 mg due to N/V Lab Results  Component Value Date   HGBA1C 5.2 02/11/2023

## 2023-05-19 NOTE — Assessment & Plan Note (Signed)
Reflux symptoms are minimal on current therapy - omeprazole. No red flag signs such as weight loss, n/v, melena  

## 2023-05-19 NOTE — Progress Notes (Signed)
Date:  05/19/2023   Name:  Kaitlyn Mcdaniel   DOB:  08-Jun-1971   MRN:  [id number]   Chief Complaint: Annual Exam Kaitlyn Mcdaniel is a 52 y.o. female who presents today for her Complete Annual Exam. She feels well. She reports exercising/ walking. She reports she is sleeping well. Breast complaints - none.  Mammogram: 06/2022 DEXA: none Pap smear: 03/2020 neg/neg Colonoscopy: Cologuard 05/2020  Health Maintenance Due  Topic Date Due   HIV Screening  Never done   DTaP/Tdap/Td (2 - Td or Tdap) 09/16/2019   OPHTHALMOLOGY EXAM  09/10/2022   INFLUENZA VACCINE  04/16/2023   COVID-19 Vaccine (5 - 2023-24 season) 05/17/2023   Fecal DNA (Cologuard)  05/17/2023    Immunization History  Administered Date(s) Administered   COVID-19, mRNA, vaccine(Comirnaty)12 years and older 09/12/2022   Influenza,inj,Quad PF,6+ Mos 05/12/2022   Influenza-Unspecified 06/13/2017, 06/17/2019, 06/15/2021   PFIZER(Purple Top)SARS-COV-2 Vaccination 09/07/2019, 10/06/2019, 07/06/2020   Tdap 09/15/2009   Zoster Recombinant(Shingrix) 05/12/2022, 09/12/2022    Diabetes She presents for her follow-up diabetic visit. She has type 2 diabetes mellitus. Her disease course has been improving. Pertinent negatives for hypoglycemia include no dizziness, headaches, nervousness/anxiousness or tremors. Pertinent negatives for diabetes include no chest pain, no fatigue, no polydipsia and no polyuria. Current diabetic treatments: mounjaro.  Hyperlipidemia This is a chronic problem. The problem is uncontrolled. Recent lipid tests were reviewed and are high. Pertinent negatives include no chest pain or shortness of breath. She is currently on no antihyperlipidemic treatment.  Gastroesophageal Reflux She complains of heartburn. She reports no abdominal pain, no chest pain, no coughing or no wheezing. This is a recurrent problem. The problem occurs occasionally. Pertinent negatives include no fatigue. She has tried a PPI for the  symptoms. The treatment provided significant relief.    Lab Results  Component Value Date   NA 142 02/11/2023   K 4.1 02/11/2023   CO2 24 02/11/2023   GLUCOSE 86 02/11/2023   BUN 13 02/11/2023   CREATININE 0.92 02/11/2023   CALCIUM 9.2 02/11/2023   EGFR 74 02/11/2023   GFRNONAA 68 04/11/2020   Lab Results  Component Value Date   CHOL 208 (H) 05/12/2022   HDL 55 05/12/2022   LDLCALC 137 (H) 05/12/2022   TRIG 89 05/12/2022   CHOLHDL 3.8 05/12/2022   Lab Results  Component Value Date   TSH 0.640 05/12/2022   Lab Results  Component Value Date   HGBA1C 5.2 02/11/2023   Lab Results  Component Value Date   WBC 7.2 05/12/2022   HGB 11.5 05/12/2022   HCT 36.5 05/12/2022   MCV 79 05/12/2022   PLT 249 05/12/2022   Lab Results  Component Value Date   ALT 6 05/12/2022   AST 12 05/12/2022   ALKPHOS 74 05/12/2022   BILITOT 0.3 05/12/2022   Lab Results  Component Value Date   VD25OH 39.7 04/11/2020     Review of Systems  Constitutional:  Negative for chills, fatigue and fever.  HENT:  Negative for congestion, hearing loss, tinnitus, trouble swallowing and voice change.   Eyes:  Negative for visual disturbance.  Respiratory:  Negative for cough, chest tightness, shortness of breath and wheezing.   Cardiovascular:  Negative for chest pain, palpitations and leg swelling.  Gastrointestinal:  Positive for heartburn. Negative for abdominal pain, constipation, diarrhea and vomiting.  Endocrine: Negative for polydipsia and polyuria.  Genitourinary:  Negative for dysuria, frequency, genital sores, vaginal bleeding and vaginal discharge.  Musculoskeletal:  Negative for arthralgias, gait problem and joint swelling.  Skin:  Negative for color change and rash.  Neurological:  Negative for dizziness, tremors, light-headedness and headaches.  Hematological:  Negative for adenopathy. Does not bruise/bleed easily.  Psychiatric/Behavioral:  Negative for dysphoric mood and sleep  disturbance. The patient is not nervous/anxious.     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   Genetic testing 06/12/2021   Family history of uterine cancer 05/30/2021   Family history of pancreatic cancer 05/30/2021   Family history of kidney cancer 05/30/2021   B12 deficiency anemia 06/09/2019   Obesity 11/26/2018   Situational anxiety 03/27/2018   Chronic iron deficiency anemia 03/26/2018   Hyperlipidemia associated with type 2 diabetes mellitus (HCC) 02/24/2018   Status post total knee replacement, 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   Status post total knee replacement, left 09/02/2016   Diabetes mellitus treated with injections of non-insulin medication (HCC) 09/05/2015   OSA (obstructive sleep apnea) 08/06/2015   Exercise-induced asthma 04/29/2015   GERD without esophagitis 04/29/2015   Hx of abnormal cervical Papanicolaou smear 04/29/2015   Degenerative arthritis of knee, bilateral 03/08/2015    No Known Allergies  Past Surgical History:  Procedure Laterality Date   BREAST REDUCTION SURGERY  1999   CERVIX LESION DESTRUCTION  1998   abnormal PAP   REDUCTION MAMMAPLASTY Bilateral 1999   TOTAL KNEE ARTHROPLASTY Left 06/2016   TOTAL KNEE ARTHROPLASTY Right 09/2016    Social History   Tobacco Use   Smoking status: Never   Smokeless tobacco: Never  Vaping Use   Vaping status: Never Used  Substance Use Topics   Alcohol use: Yes    Alcohol/week: 1.0 standard drink of alcohol    Types: 1 Glasses of wine per week   Drug use: No     Medication list has been reviewed and updated.  Current Meds  Medication Sig   acetaminophen (TYLENOL) 500 MG tablet Take 1,000 mg by mouth every 6 (six) hours as needed.   albuterol (VENTOLIN HFA) 108 (90 Base) MCG/ACT inhaler Inhale 2 puffs into the lungs every 6 (six)  hours as needed for wheezing or shortness of breath.   cetirizine (ZYRTEC) 10 MG tablet Take 10 mg by mouth daily as needed for allergies.   diclofenac (VOLTAREN) 75 MG EC tablet Take 1 tablet (75 mg total) by mouth 2 (two) times daily.   ferrous gluconate (FERGON) 324 MG tablet Take 1 tablet (324 mg total) by mouth daily.   furosemide (LASIX) 40 MG tablet Take 40 mg by mouth 2 (two) times daily as needed.   mirabegron ER (MYRBETRIQ) 50 MG TB24 tablet Take 1 tablet (50 mg total) by mouth daily.   NON FORMULARY CPAP at night   omeprazole (PRILOSEC) 20 MG capsule Take 1 capsule (20 mg total) by mouth in the morning and at bedtime.   Probiotic Product (PROBIOTIC-10 PO) Take 1 capsule by mouth daily as needed.   tirzepatide (MOUNJARO) 10 MG/0.5ML Pen Inject 10 mg into the skin once a week.       05/19/2023    8:34 AM 09/12/2022   10:57 AM 05/12/2022   10:48 AM 10/08/2021   11:11 AM  GAD 7 : Generalized Anxiety Score  Nervous, Anxious, on Edge 0 0 0 0  Control/stop worrying 0 0 0 0  Worry too much - different things  0 0 0 0  Trouble relaxing 0 0 0 0  Restless 0 0 0 0  Easily annoyed or irritable 0 0 0 0  Afraid - awful might happen 0 0 0 0  Total GAD 7 Score 0 0 0 0  Anxiety Difficulty Not difficult at all Not difficult at all Not difficult at all Not difficult at all       05/19/2023    8:33 AM 09/12/2022   10:57 AM 05/12/2022   10:48 AM  Depression screen PHQ 2/9  Decreased Interest 0 0 0  Down, Depressed, Hopeless 0 0 0  PHQ - 2 Score 0 0 0  Altered sleeping 0 2 2  Tired, decreased energy 1 1 1   Change in appetite 0 0 0  Feeling bad or failure about yourself  0 0 0  Trouble concentrating 0 0 0  Moving slowly or fidgety/restless 0 0 0  Suicidal thoughts 0 0 0  PHQ-9 Score 1 3 3   Difficult doing work/chores Not difficult at all Not difficult at all Not difficult at all    BP Readings from Last 3 Encounters:  05/19/23 118/74  03/23/23 124/80  03/10/23 123/60    Physical  Exam Vitals and nursing note reviewed.  Constitutional:      General: She is not in acute distress.    Appearance: She is well-developed.  HENT:     Head: Normocephalic and atraumatic.     Right Ear: Tympanic membrane and ear canal normal.     Left Ear: Tympanic membrane and ear canal normal.     Nose:     Right Sinus: No maxillary sinus tenderness.     Left Sinus: No maxillary sinus tenderness.  Eyes:     General: No scleral icterus.       Right eye: No discharge.        Left eye: No discharge.     Conjunctiva/sclera: Conjunctivae normal.  Neck:     Thyroid: No thyromegaly.     Vascular: No carotid bruit.  Cardiovascular:     Rate and Rhythm: Normal rate and regular rhythm.     Pulses: Normal pulses.     Heart sounds: Normal heart sounds.  Pulmonary:     Effort: Pulmonary effort is normal. No respiratory distress.     Breath sounds: No wheezing.  Chest:  Breasts:    Right: No mass, nipple discharge, skin change or tenderness.     Left: No mass, nipple discharge, skin change or tenderness.     Comments: Breast reduction scars bilaterally Abdominal:     General: Bowel sounds are normal.     Palpations: Abdomen is soft.     Tenderness: There is no abdominal tenderness.  Musculoskeletal:     Cervical back: Normal range of motion. No erythema.     Right lower leg: No edema.     Left lower leg: No edema.  Lymphadenopathy:     Cervical: No cervical adenopathy.  Skin:    General: Skin is warm and dry.     Findings: No rash.  Neurological:     Mental Status: She is alert and oriented to person, place, and time.     Cranial Nerves: No cranial nerve deficit.     Sensory: No sensory deficit.     Deep Tendon Reflexes: Reflexes are normal and symmetric.  Psychiatric:        Attention and Perception: Attention normal.        Mood and Affect: Mood  normal.    Diabetic Foot Exam - Simple   Simple Foot Form Diabetic Foot exam was performed with the following findings: Yes  05/19/2023  8:43 AM  Visual Inspection No deformities, no ulcerations, no other skin breakdown bilaterally: Yes Sensation Testing Intact to touch and monofilament testing bilaterally: Yes Pulse Check Posterior Tibialis and Dorsalis pulse intact bilaterally: Yes Comments      Wt Readings from Last 3 Encounters:  05/19/23 192 lb 3.2 oz (87.2 kg)  03/23/23 200 lb (90.7 kg)  03/10/23 195 lb (88.5 kg)    BP 118/74   Pulse 91   Ht 5\' 3"  (1.6 m)   Wt 192 lb 3.2 oz (87.2 kg)   SpO2 99%   BMI 34.05 kg/m   Assessment and Plan:  Problem List Items Addressed This Visit       Unprioritized   OSA (obstructive sleep apnea) (Chronic)    Not currently using CPAP due to anxiety - she no longer snores per her husband since the weight loss      Hyperlipidemia associated with type 2 diabetes mellitus (HCC) (Chronic)    Should be on statin therapy but patient declines due to fam hx dementia. Will consider Zetia when labs return Lab Results  Component Value Date   LDLCALC 137 (H) 05/12/2022         Relevant Orders   Lipid panel   GERD without esophagitis (Chronic)    Reflux symptoms are minimal on current therapy - omeprazole. No red flag signs such as weight loss, n/v, melena       Relevant Orders   CBC with Differential/Platelet   Diabetes mellitus treated with injections of non-insulin medication (HCC)    Blood sugars stable without hypoglycemic symptoms or events. Current regimen is Mounjaro. Changes made last visit are reducing the dose to 10 mg due to N/V Lab Results  Component Value Date   HGBA1C 5.2 02/11/2023         Relevant Orders   Comprehensive metabolic panel   Hemoglobin A1c   Microalbumin / creatinine urine ratio   TSH   Other Visit Diagnoses     Annual physical exam    -  Primary   Relevant Orders   CBC with Differential/Platelet   Comprehensive metabolic panel   Hemoglobin A1c   Lipid panel   Microalbumin / creatinine urine ratio   TSH    Encounter for screening mammogram for breast cancer       Relevant Orders   MM 3D SCREENING MAMMOGRAM BILATERAL BREAST   Colon cancer screening       Relevant Orders   Cologuard       Return in about 4 months (around 09/18/2023) for DM.    Reubin Milan, MD [facility] [place]

## 2023-05-19 NOTE — Patient Instructions (Signed)
Call [facility] to schedule your mammogram at [phone].  

## 2023-05-20 ENCOUNTER — Other Ambulatory Visit: Payer: Self-pay | Admitting: Internal Medicine

## 2023-05-20 ENCOUNTER — Other Ambulatory Visit: Payer: Self-pay

## 2023-05-20 DIAGNOSIS — E1169 Type 2 diabetes mellitus with other specified complication: Secondary | ICD-10-CM

## 2023-05-20 LAB — MICROALBUMIN / CREATININE URINE RATIO
Creatinine, Urine: 121 mg/dL
Microalb Creat Ratio: 5 mg/g{creat} (ref 0–29)
Microalb, Ur: 6.2 ug/mL — ABNORMAL HIGH

## 2023-05-20 MED ORDER — EZETIMIBE 10 MG PO TABS
10.0000 mg | ORAL_TABLET | Freq: Every day | ORAL | 3 refills | Status: AC
Start: 2023-05-20 — End: ?
  Filled 2023-05-20: qty 30, 30d supply, fill #0
  Filled 2023-06-26: qty 30, 30d supply, fill #1

## 2023-05-24 DIAGNOSIS — Z1211 Encounter for screening for malignant neoplasm of colon: Secondary | ICD-10-CM | POA: Diagnosis not present

## 2023-05-29 LAB — COLOGUARD: COLOGUARD: NEGATIVE

## 2023-06-29 ENCOUNTER — Other Ambulatory Visit: Payer: Self-pay

## 2023-07-14 ENCOUNTER — Ambulatory Visit
Admission: RE | Admit: 2023-07-14 | Discharge: 2023-07-14 | Disposition: A | Discharge status: Home / Self Care | Payer: BC Managed Care – PPO | Source: Ambulatory Visit | Attending: Internal Medicine | Admitting: Internal Medicine

## 2023-07-14 DIAGNOSIS — Z1231 Encounter for screening mammogram for malignant neoplasm of breast: Secondary | ICD-10-CM | POA: Diagnosis not present

## 2023-07-23 ENCOUNTER — Encounter: Payer: Self-pay | Admitting: Internal Medicine

## 2023-07-23 ENCOUNTER — Other Ambulatory Visit: Payer: Self-pay

## 2023-07-23 ENCOUNTER — Other Ambulatory Visit: Payer: Self-pay | Admitting: Internal Medicine

## 2023-07-23 MED ORDER — TIRZEPATIDE 12.5 MG/0.5ML ~~LOC~~ SOAJ
12.5000 mg | SUBCUTANEOUS | 0 refills | Status: DC
  Filled 2023-07-23: qty 2, 28d supply, fill #0
  Filled 2023-08-26: qty 2, 28d supply, fill #1
  Filled 2023-09-29: qty 2, 28d supply, fill #2

## 2023-08-07 ENCOUNTER — Encounter: Payer: Self-pay | Admitting: Internal Medicine

## 2023-08-07 NOTE — Telephone Encounter (Signed)
Care team updated and letter sent for eye exam notes.

## 2023-09-17 ENCOUNTER — Encounter: Payer: Self-pay | Admitting: Internal Medicine

## 2023-09-17 ENCOUNTER — Other Ambulatory Visit: Payer: Self-pay

## 2023-09-17 ENCOUNTER — Ambulatory Visit: Payer: BC Managed Care – PPO | Admitting: Internal Medicine

## 2023-09-17 VITALS — BP 128/76 | HR 71 | Ht 63.0 in | Wt 189.0 lb

## 2023-09-17 DIAGNOSIS — E1169 Type 2 diabetes mellitus with other specified complication: Secondary | ICD-10-CM

## 2023-09-17 DIAGNOSIS — E119 Type 2 diabetes mellitus without complications: Secondary | ICD-10-CM | POA: Diagnosis not present

## 2023-09-17 DIAGNOSIS — Z7985 Long-term (current) use of injectable non-insulin antidiabetic drugs: Secondary | ICD-10-CM | POA: Diagnosis not present

## 2023-09-17 DIAGNOSIS — T753XXA Motion sickness, initial encounter: Secondary | ICD-10-CM | POA: Diagnosis not present

## 2023-09-17 DIAGNOSIS — Z23 Encounter for immunization: Secondary | ICD-10-CM | POA: Diagnosis not present

## 2023-09-17 DIAGNOSIS — E785 Hyperlipidemia, unspecified: Secondary | ICD-10-CM

## 2023-09-17 LAB — POCT GLYCOSYLATED HEMOGLOBIN (HGB A1C): Hemoglobin A1C: 4.5 % (ref 4.0–5.6)

## 2023-09-17 MED ORDER — SCOPOLAMINE 1 MG/3DAYS TD PT72
1.0000 | MEDICATED_PATCH | TRANSDERMAL | 0 refills | Status: DC
  Filled 2023-09-17: qty 4, 12d supply, fill #0

## 2023-09-17 NOTE — Progress Notes (Signed)
 Date:  09/17/2023   Name:  Kaitlyn Mcdaniel   DOB:  03-Jun-1971   MRN:  [id number]   Chief Complaint: Hyperlipidemia and Diabetes  Diabetes She presents for her follow-up diabetic visit. She has type 2 diabetes mellitus. Her disease course has been stable. Pertinent negatives for hypoglycemia include no headaches or tremors. Pertinent negatives for diabetes include no chest pain, no fatigue, no polydipsia and no polyuria. Current diabetic treatments: Mounjaro  10 mg.  Hyperlipidemia This is a chronic problem. Recent lipid tests were reviewed and are high. Pertinent negatives include no chest pain or shortness of breath. Current antihyperlipidemic treatment includes ezetimibe .    Review of Systems  Constitutional:  Negative for appetite change, fatigue, fever and unexpected weight change.  HENT:  Negative for tinnitus and trouble swallowing.   Eyes:  Negative for visual disturbance.  Respiratory:  Negative for cough, chest tightness and shortness of breath.   Cardiovascular:  Negative for chest pain, palpitations and leg swelling.  Gastrointestinal:  Negative for abdominal pain.  Endocrine: Negative for polydipsia and polyuria.  Genitourinary:  Negative for dysuria and hematuria.  Musculoskeletal:  Negative for arthralgias.  Neurological:  Negative for tremors, numbness and headaches.  Psychiatric/Behavioral:  Negative for dysphoric mood.      Lab Results  Component Value Date   NA 136 05/19/2023   K 3.9 05/19/2023   CO2 27 05/19/2023   GLUCOSE 86 05/19/2023   BUN 18 05/19/2023   CREATININE 0.97 05/19/2023   CALCIUM  8.8 (L) 05/19/2023   EGFR 74 02/11/2023   GFRNONAA >60 05/19/2023   Lab Results  Component Value Date   CHOL 209 (H) 05/19/2023   HDL 48 05/19/2023   LDLCALC 147 (H) 05/19/2023   TRIG 69 05/19/2023   CHOLHDL 4.4 05/19/2023   Lab Results  Component Value Date   TSH 0.817 05/19/2023   Lab Results  Component Value Date   HGBA1C 4.5 09/17/2023   Lab  Results  Component Value Date   WBC 5.7 05/19/2023   HGB 12.3 05/19/2023   HCT 38.1 05/19/2023   MCV 82.6 05/19/2023   PLT 216 05/19/2023   Lab Results  Component Value Date   ALT 6 05/19/2023   AST 9 (L) 05/19/2023   ALKPHOS 51 05/19/2023   BILITOT 0.5 05/19/2023   Lab Results  Component Value Date   VD25OH 39.7 04/11/2020     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   Genetic testing 06/12/2021   Family history of uterine cancer 05/30/2021   Family history of pancreatic cancer 05/30/2021   Family history of kidney cancer 05/30/2021   B12 deficiency anemia 06/09/2019   Obesity 11/26/2018   Situational anxiety 03/27/2018   Chronic iron  deficiency anemia 03/26/2018   Hyperlipidemia associated with type 2 diabetes mellitus (HCC) 02/24/2018   Status post total knee replacement, 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   Status post total knee replacement, left 09/02/2016   Diabetes mellitus treated with injections of non-insulin  medication (HCC) 09/05/2015   OSA (obstructive sleep apnea) 08/06/2015   Exercise-induced asthma 04/29/2015   GERD without esophagitis 04/29/2015   Hx of abnormal cervical Papanicolaou smear 04/29/2015   Degenerative arthritis of knee, bilateral 03/08/2015    No Known Allergies  Past Surgical History:  Procedure Laterality Date   BREAST REDUCTION SURGERY  1999   CERVIX LESION DESTRUCTION  1998   abnormal PAP   REDUCTION MAMMAPLASTY Bilateral 1999   TOTAL KNEE ARTHROPLASTY Left 06/2016   TOTAL KNEE ARTHROPLASTY Right 09/2016    Social History   Tobacco Use   Smoking status: Never   Smokeless tobacco: Never  Vaping Use   Vaping status: Never Used  Substance Use Topics   Alcohol use: Yes    Alcohol/week: 1.0 standard drink of alcohol    Types: 1 Glasses of wine per week    Drug use: No     Medication list has been reviewed and updated.  Current Meds  Medication Sig   acetaminophen (TYLENOL) 500 MG tablet Take 1,000 mg by mouth every 6 (six) hours as needed.   albuterol  (VENTOLIN  HFA) 108 (90 Base) MCG/ACT inhaler Inhale 2 puffs into the lungs every 6 (six) hours as needed for wheezing or shortness of breath.   cetirizine (ZYRTEC) 10 MG tablet Take 10 mg by mouth daily as needed for allergies.   diclofenac  (VOLTAREN ) 75 MG EC tablet Take 1 tablet (75 mg total) by mouth 2 (two) times daily.   ezetimibe  (ZETIA ) 10 MG tablet Take 1 tablet (10 mg total) by mouth daily.   ferrous gluconate  (FERGON) 324 MG tablet Take 1 tablet (324 mg total) by mouth daily.   furosemide (LASIX) 40 MG tablet Take 40 mg by mouth 2 (two) times daily as needed.   mirabegron  ER (MYRBETRIQ ) 50 MG TB24 tablet Take 1 tablet (50 mg total) by mouth daily.   NON FORMULARY CPAP at night   omeprazole  (PRILOSEC) 20 MG capsule Take 1 capsule (20 mg total) by mouth in the morning and at bedtime.   Probiotic Product (PROBIOTIC-10 PO) Take 1 capsule by mouth daily as needed.   scopolamine  (TRANSDERM SCOP , 1.5 MG,) 1 MG/3DAYS Place 1 patch (1.5 mg total) onto the skin every 3 (three) days.   tirzepatide  (MOUNJARO ) 12.5 MG/0.5ML Pen Inject 12.5 mg into the skin once a week.       09/17/2023   10:43 AM 05/19/2023    8:34 AM 09/12/2022   10:57 AM 05/12/2022   10:48 AM  GAD 7 : Generalized Anxiety Score  Nervous, Anxious, on Edge 0 0 0 0  Control/stop worrying 0 0 0 0  Worry too much - different things 0 0 0 0  Trouble relaxing 0 0 0 0  Restless 0 0 0 0  Easily annoyed or irritable 0 0 0 0  Afraid - awful might happen 0 0 0 0  Total GAD 7 Score 0 0 0 0  Anxiety Difficulty Not difficult at all Not difficult at all Not difficult at all Not difficult at all       09/17/2023   10:42 AM 05/19/2023    8:33 AM 09/12/2022   10:57 AM  Depression screen PHQ 2/9  Decreased Interest 0 0 0  Down,  Depressed, Hopeless 0 0 0  PHQ - 2 Score 0 0 0  Altered sleeping 0 0 2  Tired, decreased energy 0 1 1  Change in appetite 0 0 0  Feeling bad or failure about yourself  0 0 0  Trouble concentrating 0 0 0  Moving slowly or fidgety/restless 0 0 0  Suicidal thoughts 0 0 0  PHQ-9 Score 0 1 3  Difficult doing work/chores Not difficult at all Not difficult at all Not difficult at all    BP Readings from Last 3 Encounters:  09/17/23 128/76  05/19/23 118/74  03/23/23 124/80  Physical Exam Vitals and nursing note reviewed.  Constitutional:      General: She is not in acute distress.    Appearance: She is well-developed.  HENT:     Head: Normocephalic and atraumatic.  Cardiovascular:     Rate and Rhythm: Normal rate and regular rhythm.  Pulmonary:     Effort: Pulmonary effort is normal. No respiratory distress.     Breath sounds: No wheezing or rhonchi.  Skin:    General: Skin is warm and dry.     Findings: No rash.  Neurological:     Mental Status: She is alert and oriented to person, place, and time.  Psychiatric:        Mood and Affect: Mood normal.        Behavior: Behavior normal.     Wt Readings from Last 3 Encounters:  09/17/23 189 lb (85.7 kg)  05/19/23 192 lb 3.2 oz (87.2 kg)  03/23/23 200 lb (90.7 kg)    BP 128/76   Pulse 71   Ht 5' 3 (1.6 m)   Wt 189 lb (85.7 kg)   SpO2 100%   BMI 33.48 kg/m   Assessment and Plan:  Problem List Items Addressed This Visit       Unprioritized   Diabetes mellitus treated with injections of non-insulin  medication (HCC)   Blood sugars stable without hypoglycemic symptoms or events. Currently managed with mounjaro . Changes made last visit are none.  Current dose 12.5 mg - will consider increase to 15 mg. Lab Results  Component Value Date   HGBA1C 5.0 05/19/2023  A1C today = 4.5       Relevant Orders   POCT glycosylated hemoglobin (Hb A1C) (Completed)   Hyperlipidemia associated with type 2 diabetes mellitus  (HCC) - Primary (Chronic)   Started Zetia  last visit for elevated cholesterol - doing well without side effects Declined statin therapy.      Other Visit Diagnoses       Need for influenza vaccination       Relevant Orders   Flu vaccine trivalent PF, 6mos and older(Flulaval,Afluria,Fluarix,Fluzone)     Need for COVID-19 vaccine       Relevant Orders   Pfizer Comirnaty Covid -19 Vaccine 67yrs and older     Motion sickness, initial encounter       Relevant Medications   scopolamine  (TRANSDERM SCOP , 1.5 MG,) 1 MG/3DAYS       Return in about 4 months (around 01/15/2024) for DM.    Leita HILARIO Adie, MD [facility] [place]

## 2023-09-17 NOTE — Assessment & Plan Note (Addendum)
 Blood sugars stable without hypoglycemic symptoms or events. Currently managed with mounjaro . Changes made last visit are none.  Current dose 12.5 mg - will consider increase to 15 mg. Lab Results  Component Value Date   HGBA1C 5.0 05/19/2023  A1C today = 4.5

## 2023-09-17 NOTE — Assessment & Plan Note (Addendum)
 Started Zetia last visit for elevated cholesterol - doing well without side effects Declined statin therapy.

## 2023-11-02 ENCOUNTER — Encounter: Payer: Self-pay | Admitting: Internal Medicine

## 2023-11-03 ENCOUNTER — Other Ambulatory Visit: Payer: Self-pay

## 2023-11-03 ENCOUNTER — Other Ambulatory Visit: Payer: Self-pay | Admitting: Internal Medicine

## 2023-11-03 DIAGNOSIS — E118 Type 2 diabetes mellitus with unspecified complications: Secondary | ICD-10-CM

## 2023-11-03 MED ORDER — TIRZEPATIDE 15 MG/0.5ML ~~LOC~~ SOAJ
15.0000 mg | SUBCUTANEOUS | 1 refills | Status: DC
  Filled 2023-11-03: qty 2, 28d supply, fill #0
  Filled 2023-11-29: qty 2, 28d supply, fill #1
  Filled 2024-01-15: qty 2, 28d supply, fill #2
  Filled 2024-02-11: qty 2, 28d supply, fill #3
  Filled 2024-03-27: qty 2, 28d supply, fill #4
  Filled 2024-04-20: qty 2, 28d supply, fill #5

## 2023-11-03 NOTE — Telephone Encounter (Signed)
 Please review. Ok to send?  KP

## 2024-01-15 ENCOUNTER — Encounter: Payer: Self-pay | Admitting: Internal Medicine

## 2024-01-15 ENCOUNTER — Ambulatory Visit: Payer: Self-pay | Admitting: Internal Medicine

## 2024-01-15 VITALS — BP 100/64 | HR 71 | Ht 63.0 in | Wt 178.5 lb

## 2024-01-15 DIAGNOSIS — E119 Type 2 diabetes mellitus without complications: Secondary | ICD-10-CM | POA: Diagnosis not present

## 2024-01-15 DIAGNOSIS — Z23 Encounter for immunization: Secondary | ICD-10-CM

## 2024-01-15 DIAGNOSIS — Z7985 Long-term (current) use of injectable non-insulin antidiabetic drugs: Secondary | ICD-10-CM

## 2024-01-15 DIAGNOSIS — K219 Gastro-esophageal reflux disease without esophagitis: Secondary | ICD-10-CM

## 2024-01-15 LAB — POCT GLYCOSYLATED HEMOGLOBIN (HGB A1C): Hemoglobin A1C: 4.8 % (ref 4.0–5.6)

## 2024-01-15 NOTE — Assessment & Plan Note (Signed)
 Reflux symptoms are controlled on PRN omeprazole . Patient denies red flag symptoms - no melena, unintended weight loss, dysphagia.

## 2024-01-15 NOTE — Assessment & Plan Note (Signed)
 Blood sugars have been stable.  No recent hypoglycemic events requiring assistance. Currently medications are Mounjaro . Lab Results  Component Value Date   HGBA1C 4.8 01/15/2024   Last visit no changes made.

## 2024-01-15 NOTE — Progress Notes (Signed)
 Date:  01/15/2024   Name:  Kaitlyn Mcdaniel   DOB:  1970/10/02   MRN:  [id number]   Chief Complaint: Diabetes  Diabetes She presents for her follow-up diabetic visit. She has type 2 diabetes mellitus. Her disease course has been stable. Pertinent negatives for hypoglycemia include no headaches or tremors. Pertinent negatives for diabetes include no blurred vision, no chest pain, no fatigue, no foot paresthesias, no polydipsia, no polyuria and no visual change. Symptoms are improving. Current diabetic treatments: mounjaro . She is compliant with treatment all of the time.    Review of Systems  Constitutional:  Negative for appetite change, fatigue, fever and unexpected weight change.  HENT:  Negative for tinnitus and trouble swallowing.   Eyes:  Negative for blurred vision and visual disturbance.  Respiratory:  Negative for cough, chest tightness and shortness of breath.   Cardiovascular:  Negative for chest pain, palpitations and leg swelling.  Gastrointestinal:  Negative for abdominal pain.  Endocrine: Negative for polydipsia and polyuria.  Genitourinary:  Negative for dysuria and hematuria.  Musculoskeletal:  Negative for arthralgias.  Neurological:  Negative for tremors, numbness and headaches.  Psychiatric/Behavioral:  Negative for dysphoric mood.      Lab Results  Component Value Date   NA 136 05/19/2023   K 3.9 05/19/2023   CO2 27 05/19/2023   GLUCOSE 86 05/19/2023   BUN 18 05/19/2023   CREATININE 0.97 05/19/2023   CALCIUM  8.8 (L) 05/19/2023   EGFR 74 02/11/2023   GFRNONAA >60 05/19/2023   Lab Results  Component Value Date   CHOL 209 (H) 05/19/2023   HDL 48 05/19/2023   LDLCALC 147 (H) 05/19/2023   TRIG 69 05/19/2023   CHOLHDL 4.4 05/19/2023   Lab Results  Component Value Date   TSH 0.817 05/19/2023   Lab Results  Component Value Date   HGBA1C 4.8 01/15/2024   Lab Results  Component Value Date   WBC 5.7 05/19/2023   HGB 12.3 05/19/2023   HCT 38.1  05/19/2023   MCV 82.6 05/19/2023   PLT 216 05/19/2023   Lab Results  Component Value Date   ALT 6 05/19/2023   AST 9 (L) 05/19/2023   ALKPHOS 51 05/19/2023   BILITOT 0.5 05/19/2023   Lab Results  Component Value Date   VD25OH 39.7 04/11/2020     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   Genetic testing 06/12/2021   Family history of uterine cancer 05/30/2021   Family history of pancreatic cancer 05/30/2021   Family history of kidney cancer 05/30/2021   B12 deficiency anemia 06/09/2019   Obesity 11/26/2018   Situational anxiety 03/27/2018   Chronic iron  deficiency anemia 03/26/2018   Hyperlipidemia associated with type 2 diabetes mellitus (HCC) 02/24/2018   Status post total knee replacement, 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   Status post total knee replacement, left 09/02/2016   Diabetes mellitus treated with injections of non-insulin  medication (HCC) 09/05/2015   OSA (obstructive sleep apnea) 08/06/2015   Exercise-induced asthma 04/29/2015   GERD without esophagitis 04/29/2015   Hx of abnormal cervical Papanicolaou smear 04/29/2015   Degenerative arthritis of knee, bilateral 03/08/2015    No Known Allergies  Past Surgical History:  Procedure Laterality Date   BREAST REDUCTION SURGERY  1999   CERVIX LESION DESTRUCTION  1998   abnormal PAP   REDUCTION MAMMAPLASTY Bilateral  1999   TOTAL KNEE ARTHROPLASTY Left 06/2016   TOTAL KNEE ARTHROPLASTY Right 09/2016    Social History   Tobacco Use   Smoking status: Never   Smokeless tobacco: Never  Vaping Use   Vaping status: Never Used  Substance Use Topics   Alcohol use: Yes    Alcohol/week: 1.0 standard drink of alcohol    Types: 1 Glasses of wine per week   Drug use: No     Medication list has been reviewed and updated.  Current Meds   Medication Sig   acetaminophen (TYLENOL) 500 MG tablet Take 1,000 mg by mouth every 6 (six) hours as needed.   albuterol  (VENTOLIN  HFA) 108 (90 Base) MCG/ACT inhaler Inhale 2 puffs into the lungs every 6 (six) hours as needed for wheezing or shortness of breath.   cetirizine (ZYRTEC) 10 MG tablet Take 10 mg by mouth daily as needed for allergies.   diclofenac  (VOLTAREN ) 75 MG EC tablet Take 1 tablet (75 mg total) by mouth 2 (two) times daily.   ezetimibe  (ZETIA ) 10 MG tablet Take 1 tablet (10 mg total) by mouth daily.   ferrous gluconate  (FERGON) 324 MG tablet Take 1 tablet (324 mg total) by mouth daily.   furosemide (LASIX) 40 MG tablet Take 40 mg by mouth 2 (two) times daily as needed.   NON FORMULARY CPAP at night   omeprazole  (PRILOSEC) 20 MG capsule Take 1 capsule (20 mg total) by mouth in the morning and at bedtime.   Probiotic Product (PROBIOTIC-10 PO) Take 1 capsule by mouth daily as needed.   tirzepatide  (MOUNJARO ) 15 MG/0.5ML Pen Inject 15 mg into the skin once a week.       09/17/2023   10:43 AM 05/19/2023    8:34 AM 09/12/2022   10:57 AM 05/12/2022   10:48 AM  GAD 7 : Generalized Anxiety Score  Nervous, Anxious, on Edge 0 0 0 0  Control/stop worrying 0 0 0 0  Worry too much - different things 0 0 0 0  Trouble relaxing 0 0 0 0  Restless 0 0 0 0  Easily annoyed or irritable 0 0 0 0  Afraid - awful might happen 0 0 0 0  Total GAD 7 Score 0 0 0 0  Anxiety Difficulty Not difficult at all Not difficult at all Not difficult at all Not difficult at all       09/17/2023   10:42 AM 05/19/2023    8:33 AM 09/12/2022   10:57 AM  Depression screen PHQ 2/9  Decreased Interest 0 0 0  Down, Depressed, Hopeless 0 0 0  PHQ - 2 Score 0 0 0  Altered sleeping 0 0 2  Tired, decreased energy 0 1 1  Change in appetite 0 0 0  Feeling bad or failure about yourself  0 0 0  Trouble concentrating 0 0 0  Moving slowly or fidgety/restless 0 0 0  Suicidal thoughts 0 0 0  PHQ-9 Score 0 1 3   Difficult doing work/chores Not difficult at all Not difficult at all Not difficult at all    BP Readings from Last 3 Encounters:  01/15/24 100/64  09/17/23 128/76  05/19/23 118/74    Physical Exam Vitals and nursing note reviewed.  Constitutional:      General: She is not in acute distress.    Appearance: She is well-developed.  HENT:     Head: Normocephalic and atraumatic.  Cardiovascular:     Rate and Rhythm: Normal rate and  regular rhythm.     Pulses: Normal pulses.     Heart sounds: No murmur heard. Pulmonary:     Effort: Pulmonary effort is normal. No respiratory distress.     Breath sounds: No wheezing or rhonchi.  Musculoskeletal:     Right lower leg: No edema.     Left lower leg: No edema.  Skin:    General: Skin is warm and dry.     Findings: No rash.  Neurological:     Mental Status: She is alert and oriented to person, place, and time.  Psychiatric:        Mood and Affect: Mood normal.        Behavior: Behavior normal.     Wt Readings from Last 3 Encounters:  01/15/24 178 lb 8 oz (81 kg)  09/17/23 189 lb (85.7 kg)  05/19/23 192 lb 3.2 oz (87.2 kg)    BP 100/64   Pulse 71   Ht 5\' 3"  (1.6 m)   Wt 178 lb 8 oz (81 kg)   SpO2 98%   BMI 31.62 kg/m   Assessment and Plan:  Problem List Items Addressed This Visit       Unprioritized   GERD without esophagitis (Chronic)   Reflux symptoms are controlled on PRN omeprazole . Patient denies red flag symptoms - no melena, unintended weight loss, dysphagia.       Diabetes mellitus treated with injections of non-insulin  medication (HCC) - Primary (Chronic)   Blood sugars have been stable.  No recent hypoglycemic events requiring assistance. Currently medications are Mounjaro . Lab Results  Component Value Date   HGBA1C 4.8 01/15/2024   Last visit no changes made.       Relevant Orders   POCT glycosylated hemoglobin (Hb A1C) (Completed)   Other Visit Diagnoses       Immunization due        Relevant Orders   Pneumococcal conjugate vaccine 20-valent       Return in about 6 months (around 07/17/2024) for CPX.    Sheron Dixons, MD [facility] [place]

## 2024-05-20 ENCOUNTER — Other Ambulatory Visit: Payer: Self-pay | Admitting: Internal Medicine

## 2024-05-20 ENCOUNTER — Other Ambulatory Visit: Payer: Self-pay

## 2024-05-20 ENCOUNTER — Encounter: Payer: Self-pay | Admitting: Internal Medicine

## 2024-05-20 DIAGNOSIS — E118 Type 2 diabetes mellitus with unspecified complications: Secondary | ICD-10-CM

## 2024-05-20 MED ORDER — MOUNJARO 15 MG/0.5ML ~~LOC~~ SOAJ
15.0000 mg | SUBCUTANEOUS | 0 refills | Status: DC
  Filled 2024-05-20: qty 2, 28d supply, fill #0
  Filled 2024-06-17: qty 2, 28d supply, fill #1
  Filled 2024-07-14: qty 2, 28d supply, fill #2

## 2024-05-20 NOTE — Telephone Encounter (Signed)
 Requested medication (s) are due for refill today: yes  Requested medication (s) are on the active medication list: yes  Last refill:  11/03/23  Future visit scheduled: yes  Notes to clinic:   Medication not assigned to a protocol, review manually.      Requested Prescriptions  Pending Prescriptions Disp Refills   tirzepatide  (MOUNJARO ) 15 MG/0.5ML Pen 6 mL 1    Sig: Inject 15 mg into the skin once a week.     Off-Protocol Failed - 05/20/2024 12:25 PM      Failed - Medication not assigned to a protocol, review manually.      Passed - Valid encounter within last 12 months    Recent Outpatient Visits           4 months ago Diabetes mellitus treated with injections of non-insulin  medication [facility])   [facility], Leita DEL, MD       Future Appointments             In 2 months Justus, Leita DEL, MD [facility] at [facility], [phone] [place]

## 2024-05-20 NOTE — Telephone Encounter (Signed)
 Please review.  KP

## 2024-06-17 ENCOUNTER — Other Ambulatory Visit: Payer: Self-pay

## 2024-06-20 ENCOUNTER — Other Ambulatory Visit: Payer: Self-pay | Admitting: Internal Medicine

## 2024-06-20 DIAGNOSIS — Z1231 Encounter for screening mammogram for malignant neoplasm of breast: Secondary | ICD-10-CM

## 2024-07-20 ENCOUNTER — Encounter: Payer: Self-pay | Admitting: Internal Medicine

## 2024-07-20 ENCOUNTER — Ambulatory Visit (INDEPENDENT_AMBULATORY_CARE_PROVIDER_SITE_OTHER): Admitting: Internal Medicine

## 2024-07-20 ENCOUNTER — Ambulatory Visit
Admission: RE | Admit: 2024-07-20 | Discharge: 2024-07-20 | Disposition: A | Discharge status: Home / Self Care | Source: Ambulatory Visit | Attending: Internal Medicine | Admitting: Internal Medicine

## 2024-07-20 VITALS — BP 106/68 | HR 71 | Ht 63.0 in | Wt 187.0 lb

## 2024-07-20 DIAGNOSIS — Z23 Encounter for immunization: Secondary | ICD-10-CM

## 2024-07-20 DIAGNOSIS — E119 Type 2 diabetes mellitus without complications: Secondary | ICD-10-CM | POA: Diagnosis not present

## 2024-07-20 DIAGNOSIS — Z0000 Encounter for general adult medical examination without abnormal findings: Secondary | ICD-10-CM | POA: Diagnosis not present

## 2024-07-20 DIAGNOSIS — E1169 Type 2 diabetes mellitus with other specified complication: Secondary | ICD-10-CM

## 2024-07-20 DIAGNOSIS — Z7985 Long-term (current) use of injectable non-insulin antidiabetic drugs: Secondary | ICD-10-CM

## 2024-07-20 DIAGNOSIS — E785 Hyperlipidemia, unspecified: Secondary | ICD-10-CM

## 2024-07-20 DIAGNOSIS — Z1231 Encounter for screening mammogram for malignant neoplasm of breast: Secondary | ICD-10-CM | POA: Diagnosis not present

## 2024-07-20 DIAGNOSIS — D509 Iron deficiency anemia, unspecified: Secondary | ICD-10-CM | POA: Diagnosis not present

## 2024-07-20 DIAGNOSIS — N951 Menopausal and female climacteric states: Secondary | ICD-10-CM | POA: Insufficient documentation

## 2024-07-20 DIAGNOSIS — K219 Gastro-esophageal reflux disease without esophagitis: Secondary | ICD-10-CM

## 2024-07-20 MED ORDER — ESTRADIOL-NORETHINDRONE ACET 0.05-0.25 MG/DAY TD PTTW: 1.0000 | MEDICATED_PATCH | TRANSDERMAL | 12 refills | Status: AC

## 2024-07-20 MED ORDER — COVID-19 MRNA VAC-TRIS(PFIZER) 30 MCG/0.3ML IM SUSY
0.3000 mL | PREFILLED_SYRINGE | Freq: Once | INTRAMUSCULAR | 0 refills | Status: AC
Start: 2024-07-20 — End: 2024-07-20

## 2024-07-20 NOTE — Assessment & Plan Note (Signed)
 She declines statin therapy. Continues on Zetia .

## 2024-07-20 NOTE — Assessment & Plan Note (Signed)
 She continues to have hot flashes and sweats, brain fog She has been resisting HRT - last menses 6 mo ago. Oral hormones did not agree with her - will try Combipatch twice weekly.

## 2024-07-20 NOTE — Assessment & Plan Note (Signed)
 Currently medications are Mounjaro .  No hypoglycemic episodes noted. Home blood sugars are not being monitored. Last visit medical regimen changes were none. Lab Results  Component Value Date   HGBA1C 4.8 01/15/2024  She had adjusted her Mounjaro  recently to reset - now tapering back up to 15 mg.

## 2024-07-20 NOTE — Assessment & Plan Note (Signed)
 Currently taking omeprazole  PRN with minimal reflux symptoms. Patient denies red flag symptoms - no melena, weight loss, dysphagia. Will maintain current management.

## 2024-07-20 NOTE — Progress Notes (Addendum)
 Date:  07/20/2024   Name:  Kaitlyn Mcdaniel   DOB:  01-10-1971   MRN:  [id number]   Chief Complaint: Annual Exam Kaitlyn Mcdaniel is a 53 y.o. female who presents today for her Complete Annual Exam. She feels well. She reports exercising/ walking. She reports she is sleeping poorly. Breast complaints - none.  Health Maintenance  Topic Date Due   HIV Screening  Never done   Hepatitis B Vaccine (1 of 3 - 19+ 3-dose series) Never done   DTaP/Tdap/Td vaccine (2 - Td or Tdap) 09/16/2019   Eye exam for diabetics  09/10/2022   COVID-19 Vaccine (6 - 2025-26 season) 05/16/2024   Yearly kidney function blood test for diabetes  05/18/2024   Yearly kidney health urinalysis for diabetes  05/18/2024   Breast Cancer Screening  07/13/2024   Hemoglobin A1C  07/17/2024   Pap with HPV screening  04/11/2025   Complete foot exam   07/20/2025   Cologuard (Stool DNA test)  05/23/2026   Pneumococcal Vaccine for age over 20  Completed   Flu Shot  Completed   Hepatitis C Screening  Completed   Zoster (Shingles) Vaccine  Completed   HPV Vaccine  Aged Out   Meningitis B Vaccine  Aged Out   Diabetes She presents for her follow-up diabetic visit. She has type 2 diabetes mellitus. Her disease course has been stable. Pertinent negatives for hypoglycemia include no dizziness or headaches. Pertinent negatives for diabetes include no chest pain, no fatigue and no weakness. Current diabetic treatments: Mounjaro .  Hyperlipidemia This is a chronic problem. The problem is controlled. Pertinent negatives include no chest pain, myalgias or shortness of breath. Current antihyperlipidemic treatment includes ezetimibe . The current treatment provides moderate improvement of lipids.    Review of Systems  Constitutional:  Negative for fatigue and unexpected weight change.  HENT:  Negative for trouble swallowing.   Eyes:  Negative for visual disturbance.  Respiratory:  Negative for cough, chest tightness, shortness of  breath and wheezing.   Cardiovascular:  Negative for chest pain, palpitations and leg swelling.  Gastrointestinal:  Negative for abdominal pain, constipation and diarrhea.  Musculoskeletal:  Negative for arthralgias and myalgias.  Neurological:  Negative for dizziness, weakness, light-headedness and headaches.     Lab Results  Component Value Date   NA 136 05/19/2023   K 3.9 05/19/2023   CO2 27 05/19/2023   GLUCOSE 86 05/19/2023   BUN 18 05/19/2023   CREATININE 0.97 05/19/2023   CALCIUM  8.8 (L) 05/19/2023   EGFR 74 02/11/2023   GFRNONAA >60 05/19/2023   Lab Results  Component Value Date   CHOL 209 (H) 05/19/2023   HDL 48 05/19/2023   LDLCALC 147 (H) 05/19/2023   TRIG 69 05/19/2023   CHOLHDL 4.4 05/19/2023   Lab Results  Component Value Date   TSH 0.817 05/19/2023   Lab Results  Component Value Date   HGBA1C 4.8 01/15/2024   Lab Results  Component Value Date   WBC 5.7 05/19/2023   HGB 12.3 05/19/2023   HCT 38.1 05/19/2023   MCV 82.6 05/19/2023   PLT 216 05/19/2023   Lab Results  Component Value Date   ALT 6 05/19/2023   AST 9 (L) 05/19/2023   ALKPHOS 51 05/19/2023   BILITOT 0.5 05/19/2023   Lab Results  Component Value Date   VD25OH 39.7 04/11/2020     Patient Active Problem List   Diagnosis Date Noted   Menopausal and female climacteric states 07/20/2024  Spondylosis of lumbosacral region without myelopathy or radiculopathy 01/27/2023   Sacroiliac joint pain 01/06/2023   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   B12 deficiency anemia 06/09/2019   Obesity 11/26/2018   Situational anxiety 03/27/2018   Chronic iron  deficiency anemia 03/26/2018   Hyperlipidemia associated with type 2 diabetes mellitus (HCC) 02/24/2018   Diabetes mellitus treated with injections of non-insulin  medication (HCC) 09/05/2015   OSA (obstructive sleep apnea) 08/06/2015   Exercise-induced asthma 04/29/2015   GERD without esophagitis 04/29/2015   Hx of abnormal  cervical Papanicolaou smear 04/29/2015    No Known Allergies  Past Surgical History:  Procedure Laterality Date   BREAST REDUCTION SURGERY  1999   CERVIX LESION DESTRUCTION  1998   abnormal PAP   JOINT REPLACEMENT  06/2017   REDUCTION MAMMAPLASTY Bilateral 1999   TOTAL KNEE ARTHROPLASTY Left 06/2016   TOTAL KNEE ARTHROPLASTY Right 09/2016    Social History   Tobacco Use   Smoking status: Never   Smokeless tobacco: Never  Vaping Use   Vaping status: Never Used  Substance Use Topics   Alcohol use: Yes    Alcohol/week: 1.0 standard drink of alcohol    Comment: >1/ week   Drug use: No     Medication list has been reviewed and updated.  Current Meds  Medication Sig   acetaminophen (TYLENOL) 500 MG tablet Take 1,000 mg by mouth every 6 (six) hours as needed.   albuterol  (VENTOLIN  HFA) 108 (90 Base) MCG/ACT inhaler Inhale 2 puffs into the lungs every 6 (six) hours as needed for wheezing or shortness of breath.   cetirizine (ZYRTEC) 10 MG tablet Take 10 mg by mouth daily as needed for allergies.   COVID-19 mRNA vaccine, Pfizer, (COMIRNATY) syringe Inject 0.3 mLs into the muscle once for 1 dose.   [START ON 07/21/2024] estradiol-norethindrone (COMBIPATCH) 0.05-0.25 MG/DAY Place 1 patch onto the skin 2 (two) times a week.   ezetimibe  (ZETIA ) 10 MG tablet Take 1 tablet (10 mg total) by mouth daily.   ferrous gluconate  (FERGON) 324 MG tablet Take 1 tablet (324 mg total) by mouth daily.   furosemide (LASIX) 40 MG tablet Take 40 mg by mouth 2 (two) times daily as needed.   NON FORMULARY CPAP at night   omeprazole  (PRILOSEC) 20 MG capsule Take 1 capsule (20 mg total) by mouth in the morning and at bedtime.   Probiotic Product (PROBIOTIC-10 PO) Take 1 capsule by mouth daily as needed.   tirzepatide  (MOUNJARO ) 15 MG/0.5ML Pen Inject 15 mg into the skin once a week.       07/20/2024    8:08 AM 09/17/2023   10:43 AM 05/19/2023    8:34 AM 09/12/2022   10:57 AM  GAD 7 : Generalized  Anxiety Score  Nervous, Anxious, on Edge 0 0 0 0  Control/stop worrying 0 0 0 0  Worry too much - different things 0 0 0 0  Trouble relaxing 0 0 0 0  Restless 0 0 0 0  Easily annoyed or irritable 0 0 0 0  Afraid - awful might happen 0 0 0 0  Total GAD 7 Score 0 0 0 0  Anxiety Difficulty Not difficult at all Not difficult at all Not difficult at all Not difficult at all       07/20/2024    8:07 AM 09/17/2023   10:42 AM 05/19/2023    8:33 AM  Depression screen PHQ 2/9  Decreased Interest 0 0 0  Down, Depressed, Hopeless 0  0 0  PHQ - 2 Score 0 0 0  Altered sleeping 0 0 0  Tired, decreased energy 0 0 1  Change in appetite 0 0 0  Feeling bad or failure about yourself  0 0 0  Trouble concentrating 0 0 0  Moving slowly or fidgety/restless 0 0 0  Suicidal thoughts 0 0 0  PHQ-9 Score 0 0 1  Difficult doing work/chores Not difficult at all Not difficult at all Not difficult at all    BP Readings from Last 3 Encounters:  07/20/24 106/68  01/15/24 100/64  09/17/23 128/76    Physical Exam Vitals and nursing note reviewed.  Constitutional:      General: She is not in acute distress.    Appearance: She is well-developed.  HENT:     Head: Normocephalic and atraumatic.     Right Ear: Tympanic membrane and ear canal normal.     Left Ear: Tympanic membrane and ear canal normal.     Nose:     Right Sinus: No maxillary sinus tenderness.     Left Sinus: No maxillary sinus tenderness.  Eyes:     General: No scleral icterus.       Right eye: No discharge.        Left eye: No discharge.     Conjunctiva/sclera: Conjunctivae normal.  Neck:     Thyroid : No thyromegaly.     Vascular: No carotid bruit.  Cardiovascular:     Rate and Rhythm: Normal rate and regular rhythm.     Pulses: Normal pulses.     Heart sounds: Normal heart sounds.  Pulmonary:     Effort: Pulmonary effort is normal. No respiratory distress.     Breath sounds: No wheezing.  Abdominal:     General: Bowel sounds are  normal.     Palpations: Abdomen is soft.     Tenderness: There is no abdominal tenderness.  Musculoskeletal:     Cervical back: Normal range of motion. No erythema.     Right lower leg: No edema.     Left lower leg: No edema.  Lymphadenopathy:     Cervical: No cervical adenopathy.  Skin:    General: Skin is warm and dry.     Findings: No rash.  Neurological:     Mental Status: She is alert and oriented to person, place, and time.     Cranial Nerves: No cranial nerve deficit.     Sensory: No sensory deficit.     Deep Tendon Reflexes: Reflexes are normal and symmetric.  Psychiatric:        Attention and Perception: Attention normal.        Mood and Affect: Mood normal.    Diabetic Foot Exam - Simple   Simple Foot Form Diabetic Foot exam was performed with the following findings: Yes 07/20/2024  8:36 AM  Visual Inspection No deformities, no ulcerations, no other skin breakdown bilaterally: Yes Sensation Testing Intact to touch and monofilament testing bilaterally: Yes Pulse Check Posterior Tibialis and Dorsalis pulse intact bilaterally: Yes Comments      Wt Readings from Last 3 Encounters:  07/20/24 187 lb (84.8 kg)  01/15/24 178 lb 8 oz (81 kg)  09/17/23 189 lb (85.7 kg)    BP 106/68   Pulse 71   Ht 5' 3 (1.6 m)   Wt 187 lb (84.8 kg)   SpO2 99%   BMI 33.13 kg/m   Assessment and Plan:  Problem List Items Addressed This Visit  Unprioritized   GERD without esophagitis (Chronic)   Currently taking omeprazole  PRN with minimal reflux symptoms. Patient denies red flag symptoms - no melena, weight loss, dysphagia. Will maintain current management.       Relevant Orders   CBC with Differential/Platelet   Diabetes mellitus treated with injections of non-insulin  medication (HCC) (Chronic)   Currently medications are Mounjaro .  No hypoglycemic episodes noted. Home blood sugars are not being monitored. Last visit medical regimen changes were none. Lab  Results  Component Value Date   HGBA1C 4.8 01/15/2024  She had adjusted her Mounjaro  recently to reset - now tapering back up to 15 mg.       Relevant Orders   Comprehensive metabolic panel with GFR   Hemoglobin A1c   Microalbumin / creatinine urine ratio   TSH   Hyperlipidemia associated with type 2 diabetes mellitus (HCC) (Chronic)   She declines statin therapy. Continues on Zetia .      Relevant Orders   Lipid panel   Chronic iron  deficiency anemia (Chronic)   Relevant Orders   Iron , TIBC and Ferritin Panel   Menopausal and female climacteric states   She continues to have hot flashes and sweats, brain fog She has been resisting HRT - last menses 6 mo ago. Oral hormones did not agree with her - will try Combipatch twice weekly.      Relevant Medications   estradiol-norethindrone (COMBIPATCH) 0.05-0.25 MG/DAY (Start on 07/21/2024)   Other Visit Diagnoses       Annual physical exam    -  Primary   MM later today   Relevant Orders   CBC with Differential/Platelet   Comprehensive metabolic panel with GFR   Iron , TIBC and Ferritin Panel   Hemoglobin A1c   Lipid panel   Microalbumin / creatinine urine ratio   TSH   Urinalysis, Routine w reflex microscopic     Encounter for immunization       Relevant Orders   Flu vaccine HIGH DOSE PF(Fluzone Trivalent) (Completed)       Return in about 4 months (around 11/17/2024) for DM - [place].    Leita HILARIO Adie, MD [facility] [place]

## 2024-07-20 NOTE — Addendum Note (Signed)
 Addended by: JUSTUS LEITA DEL on: 07/20/2024 09:32 AM   Modules accepted: Level of Service

## 2024-07-21 ENCOUNTER — Ambulatory Visit: Payer: Self-pay | Admitting: Internal Medicine

## 2024-07-21 LAB — HEMOGLOBIN A1C
Est. average glucose Bld gHb Est-mCnc: 97 mg/dL
Hgb A1c MFr Bld: 5 % (ref 4.8–5.6)

## 2024-07-21 LAB — COMPREHENSIVE METABOLIC PANEL WITH GFR
ALT: 10 IU/L (ref 0–32)
AST: 12 IU/L (ref 0–40)
Albumin: 4.3 g/dL (ref 3.8–4.9)
Alkaline Phosphatase: 66 IU/L (ref 49–135)
BUN/Creatinine Ratio: 20 (ref 9–23)
BUN: 21 mg/dL (ref 6–24)
Bilirubin Total: 0.5 mg/dL (ref 0.0–1.2)
CO2: 24 mmol/L (ref 20–29)
Calcium: 9.5 mg/dL (ref 8.7–10.2)
Chloride: 104 mmol/L (ref 96–106)
Creatinine, Ser: 1.07 mg/dL — ABNORMAL HIGH (ref 0.57–1.00)
Globulin, Total: 2.5 g/dL (ref 1.5–4.5)
Glucose: 77 mg/dL (ref 70–99)
Potassium: 4.6 mmol/L (ref 3.5–5.2)
Sodium: 140 mmol/L (ref 134–144)
Total Protein: 6.8 g/dL (ref 6.0–8.5)
eGFR: 62 mL/min/1.73 (ref >59)

## 2024-07-21 LAB — LIPID PANEL
Chol/HDL Ratio: 3.4 ratio (ref 0.0–4.4)
Cholesterol, Total: 247 mg/dL — ABNORMAL HIGH (ref 100–199)
HDL: 73 mg/dL (ref >39)
LDL Chol Calc (NIH): 165 mg/dL — ABNORMAL HIGH (ref 0–99)
Triglycerides: 58 mg/dL (ref 0–149)
VLDL Cholesterol Cal: 9 mg/dL (ref 5–40)

## 2024-07-21 LAB — URINALYSIS, ROUTINE W REFLEX MICROSCOPIC
Bilirubin, UA: NEGATIVE
Glucose, UA: NEGATIVE
Ketones, UA: NEGATIVE
Nitrite, UA: NEGATIVE
Protein,UA: NEGATIVE
RBC, UA: NEGATIVE
Specific Gravity, UA: 1.016 (ref 1.005–1.030)
Urobilinogen, Ur: 0.2 mg/dL (ref 0.2–1.0)
pH, UA: 5.5 (ref 5.0–7.5)

## 2024-07-21 LAB — MICROSCOPIC EXAMINATION
Bacteria, UA: NONE SEEN
Casts: NONE SEEN /LPF

## 2024-07-21 LAB — CBC WITH DIFFERENTIAL/PLATELET
Basophils Absolute: 0 x10E3/uL (ref 0.0–0.2)
Basos: 1 %
EOS (ABSOLUTE): 0 x10E3/uL (ref 0.0–0.4)
Eos: 0 %
Hematocrit: 41.4 % (ref 34.0–46.6)
Hemoglobin: 13.4 g/dL (ref 11.1–15.9)
Immature Grans (Abs): 0 x10E3/uL (ref 0.0–0.1)
Immature Granulocytes: 0 %
Lymphocytes Absolute: 1.5 x10E3/uL (ref 0.7–3.1)
Lymphs: 32 %
MCH: 28.8 pg (ref 26.6–33.0)
MCHC: 32.4 g/dL (ref 31.5–35.7)
MCV: 89 fL (ref 79–97)
Monocytes Absolute: 0.4 x10E3/uL (ref 0.1–0.9)
Monocytes: 8 %
Neutrophils Absolute: 2.7 x10E3/uL (ref 1.4–7.0)
Neutrophils: 59 %
Platelets: 225 x10E3/uL (ref 150–450)
RBC: 4.66 x10E6/uL (ref 3.77–5.28)
RDW: 12.8 % (ref 11.7–15.4)
WBC: 4.6 x10E3/uL (ref 3.4–10.8)

## 2024-07-21 LAB — MICROALBUMIN / CREATININE URINE RATIO
Creatinine, Urine: 75.2 mg/dL
Microalb/Creat Ratio: 7 mg/g{creat} (ref 0–29)
Microalbumin, Urine: 5 ug/mL

## 2024-07-21 LAB — IRON,TIBC AND FERRITIN PANEL
Ferritin: 40 ng/mL (ref 15–150)
Iron Saturation: 23 % (ref 15–55)
Iron: 82 ug/dL (ref 27–159)
Total Iron Binding Capacity: 358 ug/dL (ref 250–450)
UIBC: 276 ug/dL (ref 131–425)

## 2024-07-21 LAB — TSH: TSH: 1.43 u[IU]/mL (ref 0.450–4.500)

## 2024-08-29 ENCOUNTER — Other Ambulatory Visit: Payer: Self-pay | Admitting: Internal Medicine

## 2024-08-29 DIAGNOSIS — E118 Type 2 diabetes mellitus with unspecified complications: Secondary | ICD-10-CM

## 2024-08-31 ENCOUNTER — Encounter: Payer: Self-pay | Admitting: Internal Medicine

## 2024-08-31 ENCOUNTER — Other Ambulatory Visit: Payer: Self-pay

## 2024-08-31 MED ORDER — MOUNJARO 15 MG/0.5ML ~~LOC~~ SOAJ
15.0000 mg | SUBCUTANEOUS | 0 refills | Status: AC
  Filled 2024-08-31: qty 2, 28d supply, fill #0
  Filled 2024-10-09: qty 2, 28d supply, fill #1

## 2024-11-17 ENCOUNTER — Ambulatory Visit: Admitting: Family Medicine
